# Patient Record
Sex: Female | Born: 1955 | Race: White | Hispanic: No | Marital: Married | Smoking: Current every day smoker
Health system: Southern US, Community
[De-identification: ages and names within clinical notes are randomized; demographics above are authoritative.]

## PROBLEM LIST (undated history)

## (undated) DIAGNOSIS — J449 Chronic obstructive pulmonary disease, unspecified: Secondary | ICD-10-CM

## (undated) DIAGNOSIS — M1990 Unspecified osteoarthritis, unspecified site: Secondary | ICD-10-CM

## (undated) DIAGNOSIS — I639 Cerebral infarction, unspecified: Secondary | ICD-10-CM

## (undated) DIAGNOSIS — I771 Stricture of artery: Secondary | ICD-10-CM

## (undated) DIAGNOSIS — R519 Headache, unspecified: Secondary | ICD-10-CM

## (undated) DIAGNOSIS — R51 Headache: Secondary | ICD-10-CM

## (undated) DIAGNOSIS — C801 Malignant (primary) neoplasm, unspecified: Secondary | ICD-10-CM

## (undated) DIAGNOSIS — K5792 Diverticulitis of intestine, part unspecified, without perforation or abscess without bleeding: Secondary | ICD-10-CM

## (undated) DIAGNOSIS — T8859XA Other complications of anesthesia, initial encounter: Secondary | ICD-10-CM

## (undated) DIAGNOSIS — T4145XA Adverse effect of unspecified anesthetic, initial encounter: Secondary | ICD-10-CM

## (undated) DIAGNOSIS — G935 Compression of brain: Secondary | ICD-10-CM

## (undated) DIAGNOSIS — J189 Pneumonia, unspecified organism: Secondary | ICD-10-CM

## (undated) DIAGNOSIS — K219 Gastro-esophageal reflux disease without esophagitis: Secondary | ICD-10-CM

## (undated) DIAGNOSIS — H269 Unspecified cataract: Secondary | ICD-10-CM

## (undated) HISTORY — PX: BREAST SURGERY: SHX581

## (undated) HISTORY — PX: OTHER SURGICAL HISTORY: SHX169

## (undated) HISTORY — PX: EYE SURGERY: SHX253

## (undated) HISTORY — PX: RHINOPLASTY: SUR1284

## (undated) HISTORY — PX: INNER EAR SURGERY: SHX679

## (undated) HISTORY — PX: TONSILLECTOMY: SUR1361

## (undated) HISTORY — PX: ABDOMINAL HYSTERECTOMY: SHX81

## (undated) HISTORY — PX: PARTIAL COLECTOMY: SHX5273

## (undated) HISTORY — PX: TUBAL LIGATION: SHX77

## (undated) HISTORY — PX: COLON SURGERY: SHX602

## (undated) HISTORY — PX: NEPHRECTOMY: SHX65

---

## 2002-11-14 ENCOUNTER — Other Ambulatory Visit: Admission: RE | Admit: 2002-11-14 | Discharge: 2002-11-14 | Payer: Self-pay | Admitting: Obstetrics & Gynecology

## 2003-04-04 ENCOUNTER — Encounter: Admission: RE | Admit: 2003-04-04 | Discharge: 2003-04-04 | Payer: Self-pay | Admitting: Obstetrics and Gynecology

## 2003-04-08 ENCOUNTER — Ambulatory Visit (HOSPITAL_COMMUNITY): Admission: RE | Admit: 2003-04-08 | Discharge: 2003-04-08 | Payer: Self-pay | Admitting: Obstetrics and Gynecology

## 2003-12-03 ENCOUNTER — Ambulatory Visit (HOSPITAL_COMMUNITY): Admission: RE | Admit: 2003-12-03 | Discharge: 2003-12-03 | Payer: Self-pay | Admitting: Family Medicine

## 2003-12-17 ENCOUNTER — Encounter (INDEPENDENT_AMBULATORY_CARE_PROVIDER_SITE_OTHER): Payer: Self-pay | Admitting: Specialist

## 2003-12-17 ENCOUNTER — Encounter: Admission: RE | Admit: 2003-12-17 | Discharge: 2003-12-17 | Payer: Self-pay | Admitting: Family Medicine

## 2004-08-22 ENCOUNTER — Ambulatory Visit (HOSPITAL_COMMUNITY): Admission: RE | Admit: 2004-08-22 | Discharge: 2004-08-22 | Payer: Self-pay | Admitting: General Practice

## 2004-08-25 ENCOUNTER — Ambulatory Visit: Payer: Self-pay | Admitting: Internal Medicine

## 2004-09-09 ENCOUNTER — Emergency Department (HOSPITAL_COMMUNITY): Admission: EM | Admit: 2004-09-09 | Discharge: 2004-09-09 | Payer: Self-pay | Admitting: Emergency Medicine

## 2005-12-25 ENCOUNTER — Emergency Department (HOSPITAL_COMMUNITY): Admission: EM | Admit: 2005-12-25 | Discharge: 2005-12-25 | Payer: Self-pay | Admitting: Emergency Medicine

## 2006-12-02 ENCOUNTER — Emergency Department (HOSPITAL_COMMUNITY): Admission: EM | Admit: 2006-12-02 | Discharge: 2006-12-02 | Payer: Self-pay | Admitting: Emergency Medicine

## 2007-11-04 ENCOUNTER — Emergency Department (HOSPITAL_COMMUNITY): Admission: EM | Admit: 2007-11-04 | Discharge: 2007-11-04 | Payer: Self-pay | Admitting: Emergency Medicine

## 2008-03-17 ENCOUNTER — Inpatient Hospital Stay (HOSPITAL_COMMUNITY): Admission: EM | Admit: 2008-03-17 | Discharge: 2008-03-20 | Payer: Self-pay | Admitting: Emergency Medicine

## 2008-03-23 ENCOUNTER — Emergency Department (HOSPITAL_COMMUNITY): Admission: EM | Admit: 2008-03-23 | Discharge: 2008-03-23 | Payer: Self-pay | Admitting: Emergency Medicine

## 2008-03-29 ENCOUNTER — Encounter: Payer: Self-pay | Admitting: Interventional Radiology

## 2008-04-25 ENCOUNTER — Inpatient Hospital Stay (HOSPITAL_COMMUNITY): Admission: RE | Admit: 2008-04-25 | Discharge: 2008-04-26 | Payer: Self-pay | Admitting: Interventional Radiology

## 2008-05-09 ENCOUNTER — Encounter: Payer: Self-pay | Admitting: Interventional Radiology

## 2008-05-29 ENCOUNTER — Inpatient Hospital Stay (HOSPITAL_COMMUNITY): Admission: EM | Admit: 2008-05-29 | Discharge: 2008-06-01 | Payer: Self-pay | Admitting: Emergency Medicine

## 2008-06-05 ENCOUNTER — Emergency Department (HOSPITAL_COMMUNITY): Admission: EM | Admit: 2008-06-05 | Discharge: 2008-06-05 | Payer: Self-pay | Admitting: Emergency Medicine

## 2009-01-17 ENCOUNTER — Encounter: Admission: RE | Admit: 2009-01-17 | Discharge: 2009-01-17 | Payer: Self-pay | Admitting: Internal Medicine

## 2009-01-21 ENCOUNTER — Encounter: Payer: Self-pay | Admitting: Cardiology

## 2009-01-23 DIAGNOSIS — F329 Major depressive disorder, single episode, unspecified: Secondary | ICD-10-CM

## 2009-01-23 DIAGNOSIS — F17200 Nicotine dependence, unspecified, uncomplicated: Secondary | ICD-10-CM

## 2009-02-04 ENCOUNTER — Ambulatory Visit: Payer: Self-pay | Admitting: Cardiology

## 2009-02-04 DIAGNOSIS — R079 Chest pain, unspecified: Secondary | ICD-10-CM | POA: Insufficient documentation

## 2009-02-10 ENCOUNTER — Telehealth (INDEPENDENT_AMBULATORY_CARE_PROVIDER_SITE_OTHER): Payer: Self-pay | Admitting: *Deleted

## 2009-02-11 ENCOUNTER — Ambulatory Visit: Payer: Self-pay | Admitting: Cardiology

## 2009-02-11 ENCOUNTER — Ambulatory Visit: Payer: Self-pay

## 2009-02-11 ENCOUNTER — Encounter (HOSPITAL_COMMUNITY): Admission: RE | Admit: 2009-02-11 | Discharge: 2009-04-09 | Payer: Self-pay | Admitting: Cardiology

## 2009-02-18 LAB — CONVERTED CEMR LAB
Cholesterol: 272 mg/dL — ABNORMAL HIGH (ref 0–200)
Direct LDL: 189 mg/dL
Total CHOL/HDL Ratio: 7
VLDL: 46 mg/dL — ABNORMAL HIGH (ref 0.0–40.0)

## 2009-03-06 ENCOUNTER — Encounter: Payer: Self-pay | Admitting: Cardiology

## 2009-03-10 ENCOUNTER — Ambulatory Visit: Payer: Self-pay | Admitting: Cardiology

## 2009-03-10 DIAGNOSIS — G458 Other transient cerebral ischemic attacks and related syndromes: Secondary | ICD-10-CM

## 2009-03-10 DIAGNOSIS — E785 Hyperlipidemia, unspecified: Secondary | ICD-10-CM

## 2009-04-01 ENCOUNTER — Emergency Department (HOSPITAL_COMMUNITY): Admission: EM | Admit: 2009-04-01 | Discharge: 2009-04-01 | Payer: Self-pay | Admitting: Emergency Medicine

## 2009-04-29 ENCOUNTER — Ambulatory Visit: Payer: Self-pay | Admitting: Internal Medicine

## 2009-04-29 DIAGNOSIS — J4489 Other specified chronic obstructive pulmonary disease: Secondary | ICD-10-CM | POA: Insufficient documentation

## 2009-04-29 DIAGNOSIS — J449 Chronic obstructive pulmonary disease, unspecified: Secondary | ICD-10-CM

## 2009-04-29 DIAGNOSIS — J8410 Pulmonary fibrosis, unspecified: Secondary | ICD-10-CM

## 2009-04-30 ENCOUNTER — Telehealth: Payer: Self-pay | Admitting: Internal Medicine

## 2009-05-05 ENCOUNTER — Telehealth (INDEPENDENT_AMBULATORY_CARE_PROVIDER_SITE_OTHER): Payer: Self-pay | Admitting: *Deleted

## 2009-05-05 ENCOUNTER — Ambulatory Visit: Payer: Self-pay | Admitting: Cardiology

## 2009-05-05 ENCOUNTER — Ambulatory Visit: Payer: Self-pay

## 2009-05-05 ENCOUNTER — Encounter: Payer: Self-pay | Admitting: Cardiology

## 2009-05-07 ENCOUNTER — Encounter: Admission: RE | Admit: 2009-05-07 | Discharge: 2009-05-07 | Payer: Self-pay | Admitting: Otolaryngology

## 2009-05-13 LAB — CONVERTED CEMR LAB
ALT: 16 units/L (ref 0–35)
AST: 20 units/L (ref 0–37)
Albumin: 3.6 g/dL (ref 3.5–5.2)
Cholesterol: 216 mg/dL — ABNORMAL HIGH (ref 0–200)
Direct LDL: 143.2 mg/dL
HDL: 55.3 mg/dL (ref >39.00)
Total Bilirubin: 0.2 mg/dL — ABNORMAL LOW (ref 0.3–1.2)
Total Protein: 6.7 g/dL (ref 6.0–8.3)
Triglycerides: 117 mg/dL (ref 0.0–149.0)

## 2009-05-27 ENCOUNTER — Telehealth: Payer: Self-pay | Admitting: Cardiology

## 2009-06-03 ENCOUNTER — Telehealth: Payer: Self-pay | Admitting: Cardiology

## 2009-06-04 ENCOUNTER — Ambulatory Visit: Payer: Self-pay | Admitting: Internal Medicine

## 2009-06-25 ENCOUNTER — Telehealth: Payer: Self-pay | Admitting: Cardiology

## 2009-07-08 ENCOUNTER — Telehealth: Payer: Self-pay | Admitting: Cardiology

## 2010-01-25 ENCOUNTER — Encounter: Payer: Self-pay | Admitting: Internal Medicine
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
## 2010-02-03 NOTE — Progress Notes (Signed)
Summary: Pt need samples of LIpitor  Phone Note Call from Patient Call back at Home Phone [phone]   Caller: Loma Sousa Summary of Call: Pt need samples on Lipitor Initial call taken by: Judie Grieve,  June 25, 2009 10:03 AM  Follow-up for Phone Call        Samples left at front desk.  Left lipitor 40mg  pt is to take 2 by mouth daily for total of 80mg . Pt husband notified. [name], CNA  June 25, 2009 10:19 AM  Follow-up by: [name], CNA,  June 25, 2009 10:20 AM

## 2010-02-03 NOTE — Assessment & Plan Note (Signed)
Summary: ekg  Nurse Visit   Allergies: 1)  ! Penicillin 2)  ! * Contrast Dye 05/05/09--10am--pt here for EKG for pre op clearance, and lab work--pt states having ear surgery by dr Ermalinda Barrios in [place] --per repeat call to pt, it is learned that her PCP, dr Maryella Shivers, is going to give clearance for surgery and is having all pertinent information sent to her office for pre op evaluation and clearance--lab work drawn today was ordered by dr hochrein at her last o.v. and has nothing to do with surgery--after learning who was doing surgery, icalled dr Donaciano Eva office and learned that surgery had not been sched yet, as they are waiting for clearance from dr Chari Manning fax EKG to dr Lovell Sheehan office and to dr Dorma Russell --nt

## 2010-02-03 NOTE — Progress Notes (Signed)
Summary: EKG order placed  Phone Note From Other Clinic Call back at  ex [phone]   Caller: [facility], Jamei Call For: wert Summary of Call: pt had ekg at [address] this am need order pt in Initial call taken by: Rickard Patience,  May 05, 2009 9:17 AM  Follow-up for Phone Call        spoke with nurse, Asher Muir.  Asher Muir states pt is being seen today over at [facility] and told nurse that MW ordered pt to have an EKG today because of a pending ear surgery.  No order in EMR.  Will forward message to MW to see if he had wanted this for pt.  Arman Filter LPN  May 05, 452 9:20 AM  done Follow-up by: Nyoka Cowden MD,  May 05, 2009 1:09 PM

## 2010-02-03 NOTE — Miscellaneous (Signed)
Summary: Orders Update pft charges  Clinical Lists Changes  Orders: Added new Service order of Lung Volumes (94240) - Signed Added new Service order of Carbon Monoxide diffusing w/capacity (94720) - Signed Added new Service order of Spirometry (Pre & Post) (94060) - Signed 
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
## 2010-02-03 NOTE — Progress Notes (Signed)
Summary: Samples of lipitor  Phone Note Call from Patient Call back at Home Phone [phone]   Caller: Patient Summary of Call: Need samples of Lipitor Initial call taken by: Judie Grieve,  Jun 03, 2009 11:07 AM  Follow-up for Phone Call        Pt notified, samples left at front desk. [name], CNA  Jun 03, 2009 11:56 AM  Follow-up by: [name], CNA,  Jun 03, 2009 11:56 AM
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
## 2010-02-03 NOTE — Progress Notes (Signed)
Summary: samples  Phone Note Call from Patient Call back at Home Phone [phone]   Caller: Patient Reason for Call: Talk to Nurse Summary of Call: request samples of lipitor 80mg  Initial call taken by: Migdalia Dk,  May 27, 2009 1:24 PM  Follow-up for Phone Call        I spoke with Ms. Dodgen and she is out of Lipitor as of last pm 05-26-09.  I told her we would possibly have samples in on Friday.  She said she would call back Friday.  She is on Lipitor 80mg . Lisabeth Devoid RN  I spoke with Arline Asp and the drug rep will try and bring samples into the office on Friday. Julieta Gutting, RN, BSN  May 27, 2009 2:54 PM

## 2010-02-03 NOTE — Progress Notes (Signed)
Summary: Nuclear pre procedure  Phone Note Outgoing Call Call back at [facility] Phone [phone]   Call placed by: Rea College, CMA,  February 10, 2009 4:03 PM Call placed to: Patient Summary of Call: Left message with information on Myoview Information Sheet (see scanned document for details).      Nuclear Med Background Indications for Stress Test: Evaluation for Ischemia   History: COPD  History Comments: 4/10 PTCA/Stent-(L)subclavian artery; h/o (R) severe vertebral artery stenosis  Symptoms: Chest Pain, SOB  Symptoms Comments: CP>(L)shoulder and neck   Nuclear Pre-Procedure Cardiac Risk Factors: PVD, Smoker Height (in): 62   Appended Document: Nuclear pre procedure PT AWARE OF RESULTS AND TO F/U AS SCHEDULED

## 2010-02-03 NOTE — Miscellaneous (Signed)
  Clinical Lists Changes  Observations: Added new observation of NUCLEAR NOS: Exercise Capacity: Poor exercise capacity. BP Response: Normal blood pressure response. Clinical Symptoms: SOB ECG Impression: No significant ST segment change suggestive of ischemia. Overall Impression: Normal stress nuclear study. Poor exercise tolerance    (02/11/2009 15:34)      Nuclear Study  Procedure date:  02/11/2009  Findings:      Exercise Capacity: Poor exercise capacity. BP Response: Normal blood pressure response. Clinical Symptoms: SOB ECG Impression: No significant ST segment change suggestive of ischemia. Overall Impression: Normal stress nuclear study. Poor exercise tolerance

## 2010-02-03 NOTE — Assessment & Plan Note (Signed)
Summary: Cardiology Nuclear Study  Nuclear Med Background Indications for Stress Test: Evaluation for Ischemia   History: COPD  History Comments: 4/10 PTCA/Stent-(L)subclavian artery; h/o (R) severe vertebral artery stenosis  Symptoms: Chest Pain, SOB  Symptoms Comments: CP>(L)shoulder and neck   Nuclear Pre-Procedure Cardiac Risk Factors: PVD, Smoker Caffeine/Decaff Intake: None NPO After: 6:00 PM Lungs: clear IV 0.9% NS with Angio Cath: 22g     IV Site: (R) AC IV Started by: Irean Hong RN Chest Size (in) 34     Cup Size B     Height (in): 62 Weight (lb): 119 BMI: 21.84  Nuclear Med Study 1 or 2 day study:  1 day     Stress Test Type:  Stress Reading MD:  Willa Rough, MD     Referring MD:  J.Hochrein Resting Radionuclide:  Technetium 80m Tetrofosmin     Resting Radionuclide Dose:  10.5 mCi  Stress Radionuclide:  Technetium 70m Tetrofosmin     Stress Radionuclide Dose:  32.0 mCi   Stress Protocol Exercise Time (min):  4:00 min     Max HR:  150 bpm     Predicted Max HR:  166 bpm  Max Systolic BP: 136 mm Hg     Percent Max HR:  90.36 %     METS: 5.70 Rate Pressure Product:  16109    Stress Test Technologist:  Milana Na EMT-P     Nuclear Technologist:  Burna Mortimer Deal RT-N  Rest Procedure  Myocardial perfusion imaging was performed at rest 45 minutes following the intravenous administration of Myoview Technetium 25m Tetrofosmin.  Stress Procedure  The patient exercised for 4:00. The patient stopped due to fatigue, sob, and denied any chest pain.  There were no significant ST-T wave changes.  Myoview was injected at peak exercise and myocardial perfusion imaging was performed after a brief delay.  QPS Raw Data Images:  Patient motion noted; appropriate software correction applied. Stress Images:  There is normal uptake in all areas. Rest Images:  Normal homogeneous uptake in all areas of the myocardium. Subtraction (SDS):  No evidence of ischemia. Transient  Ischemic Dilatation:  1.16  (Normal <1.22)  Lung/Heart Ratio:  .26  (Normal <0.45)  Quantitative Gated Spect Images QGS EDV:  55 ml QGS ESV:  18 ml QGS EF:  67 % QGS cine images:  Normal motion  Findings Normal nuclear study      Overall Impression  Exercise Capacity: Poor exercise capacity. BP Response: Normal blood pressure response. Clinical Symptoms: SOB ECG Impression: No significant ST segment change suggestive of ischemia. Overall Impression: Normal stress nuclear study. Poor exercise tolerance

## 2010-02-03 NOTE — Progress Notes (Signed)
Summary: ENT = Dorma Russell  Phone Note Call from Patient Call back at [facility] Phone [phone]   Caller: SPOUSE-STEVE Call For: Wanda James Summary of Call: [facility] FOR WIFE IS DR ERIC Rayburn Ma Initial call taken by: Lacinda Axon,  April 30, 2009 2:43 PM  Follow-up for Phone Call        Pt's spouse Brett Canales states Dr. Ermalinda Barrios is the ENT who is supposed to "patch the hole in her eardrum."  States they were told by MW to call with this info.  will forward to MW as Lorain Childes.  Gweneth Dimitri RN  April 30, 2009 2:50 PM got it, thanks Follow-up by: Nyoka Cowden MD,  April 30, 2009 4:27 PM

## 2010-02-03 NOTE — Letter (Signed)
Summary: [facility] patient records  [facility] patient records   Imported By: Kassie Mends 03/13/2009 10:35:15  _____________________________________________________________________  External Attachment:    Type:   Image     Comment:   External Document
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
## 2010-02-03 NOTE — Progress Notes (Signed)
Summary: samples  Phone Note Call from Patient Call back at Home Phone [phone]   Caller: Wanda James Summary of Call: Pt need samples of Liptor Initial call taken by: Judie Grieve,  July 08, 2009 9:00 AM  Follow-up for Phone Call        Lipitor 40mg  2 by mouth daily #28 left at front desk for pt. A discount card was also left. LMOM for pt. [name], CNA  July 08, 2009 10:44 AM  Follow-up by: [name], CNA,  July 08, 2009 10:44 AM

## 2010-04-14 LAB — DIFFERENTIAL
Basophils Absolute: 0.1 10*3/uL (ref 0.0–0.1)
Basophils Relative: 1 % (ref 0–1)
Basophils Relative: 1 % (ref 0–1)
Eosinophils Absolute: 0.5 10*3/uL (ref 0.0–0.7)
Lymphocytes Relative: 45 % (ref 12–46)
Lymphs Abs: 1.5 10*3/uL (ref 0.7–4.0)
Monocytes Absolute: 0.5 10*3/uL (ref 0.1–1.0)
Monocytes Relative: 9 % (ref 3–12)
Neutro Abs: 1.4 10*3/uL — ABNORMAL LOW (ref 1.7–7.7)
Neutro Abs: 2.9 10*3/uL (ref 1.7–7.7)
Neutrophils Relative %: 33 % — ABNORMAL LOW (ref 43–77)
Neutrophils Relative %: 54 % (ref 43–77)

## 2010-04-14 LAB — RPR: RPR Ser Ql: NONREACTIVE

## 2010-04-14 LAB — BASIC METABOLIC PANEL
BUN: 22 mg/dL (ref 6–23)
Calcium: 9.4 mg/dL (ref 8.4–10.5)
Creatinine, Ser: 0.87 mg/dL (ref 0.4–1.2)
GFR calc Af Amer: >60 mL/min (ref >60)
GFR calc non Af Amer: >60 mL/min (ref >60)

## 2010-04-14 LAB — URINALYSIS, ROUTINE W REFLEX MICROSCOPIC
Glucose, UA: NEGATIVE mg/dL
Hgb urine dipstick: NEGATIVE
Ketones, ur: NEGATIVE mg/dL
Protein, ur: NEGATIVE mg/dL
Urobilinogen, UA: 0.2 mg/dL (ref 0.0–1.0)

## 2010-04-14 LAB — CBC
HCT: 36.5 % (ref 36.0–46.0)
Hemoglobin: 12.5 g/dL (ref 12.0–15.0)
MCV: 92.4 fL (ref 78.0–100.0)
Platelets: 150 10*3/uL (ref 150–400)
RBC: 4.49 MIL/uL (ref 3.87–5.11)
WBC: 4.3 10*3/uL (ref 4.0–10.5)
WBC: 5.4 10*3/uL (ref 4.0–10.5)

## 2010-04-14 LAB — LIPID PANEL
Cholesterol: 184 mg/dL (ref 0–200)
Total CHOL/HDL Ratio: 8 RATIO

## 2010-04-14 LAB — COMPREHENSIVE METABOLIC PANEL
Alkaline Phosphatase: 70 U/L (ref 39–117)
BUN: 15 mg/dL (ref 6–23)
CO2: 28 mEq/L (ref 19–32)
Chloride: 112 mEq/L (ref 96–112)
Creatinine, Ser: 0.78 mg/dL (ref 0.4–1.2)
GFR calc non Af Amer: >60 mL/min (ref >60)
Glucose, Bld: 75 mg/dL (ref 70–99)
Potassium: 4.2 mEq/L (ref 3.5–5.1)
Total Bilirubin: 0.2 mg/dL — ABNORMAL LOW (ref 0.3–1.2)

## 2010-04-14 LAB — PROTIME-INR
INR: 0.9 (ref 0.00–1.49)
Prothrombin Time: 12.5 seconds (ref 11.6–15.2)

## 2010-04-14 LAB — POCT CARDIAC MARKERS: Troponin i, poc: <0.05 ng/mL (ref 0.00–0.09)

## 2010-04-14 LAB — TSH
TSH: 0.925 u[IU]/mL (ref 0.350–4.500)
TSH: 2.541 u[IU]/mL (ref 0.350–4.500)

## 2010-04-14 LAB — APTT: aPTT: 34 seconds (ref 24–37)

## 2010-04-14 LAB — HOMOCYSTEINE: Homocysteine: 7 umol/L (ref 4.0–15.4)

## 2010-04-15 LAB — BASIC METABOLIC PANEL
BUN: 12 mg/dL (ref 6–23)
Calcium: 9.8 mg/dL (ref 8.4–10.5)
Chloride: 110 mEq/L (ref 96–112)
GFR calc Af Amer: >60 mL/min (ref >60)
GFR calc non Af Amer: >60 mL/min (ref >60)
GFR calc non Af Amer: >60 mL/min (ref >60)
Potassium: 3.4 mEq/L — ABNORMAL LOW (ref 3.5–5.1)
Potassium: 4.3 mEq/L (ref 3.5–5.1)
Sodium: 140 mEq/L (ref 135–145)
Sodium: 143 mEq/L (ref 135–145)

## 2010-04-15 LAB — PROTIME-INR
INR: 0.9 (ref 0.00–1.49)
INR: 1 (ref 0.00–1.49)
Prothrombin Time: 12.2 seconds (ref 11.6–15.2)

## 2010-04-15 LAB — CBC
HCT: 36 % (ref 36.0–46.0)
HCT: 45 % (ref 36.0–46.0)
MCV: 91 fL (ref 78.0–100.0)
MCV: 91.9 fL (ref 78.0–100.0)
Platelets: 200 10*3/uL (ref 150–400)
RBC: 3.95 MIL/uL (ref 3.87–5.11)
RBC: 4.23 MIL/uL (ref 3.87–5.11)
WBC: 6.6 10*3/uL (ref 4.0–10.5)
WBC: 7.8 10*3/uL (ref 4.0–10.5)
WBC: 9.9 10*3/uL (ref 4.0–10.5)

## 2010-04-15 LAB — APTT
aPTT: 33 seconds (ref 24–37)
aPTT: 63 seconds — ABNORMAL HIGH (ref 24–37)

## 2010-04-15 LAB — HEPARIN LEVEL (UNFRACTIONATED): Heparin Unfractionated: 0.29 IU/mL — ABNORMAL LOW (ref 0.30–0.70)

## 2010-04-15 LAB — DIFFERENTIAL
Eosinophils Relative: 10 % — ABNORMAL HIGH (ref 0–5)
Lymphocytes Relative: 31 % (ref 12–46)
Lymphs Abs: 2.1 10*3/uL (ref 0.7–4.0)
Neutro Abs: 3.3 10*3/uL (ref 1.7–7.7)

## 2010-04-16 LAB — DIFFERENTIAL
Basophils Absolute: 0.1 10*3/uL (ref 0.0–0.1)
Basophils Relative: 2 % — ABNORMAL HIGH (ref 0–1)
Eosinophils Absolute: 0.2 10*3/uL (ref 0.0–0.7)
Monocytes Relative: 7 % (ref 3–12)
Neutro Abs: 3.3 10*3/uL (ref 1.7–7.7)
Neutrophils Relative %: 58 % (ref 43–77)

## 2010-04-16 LAB — CBC
HCT: 41 % (ref 36.0–46.0)
HCT: 42.3 % (ref 36.0–46.0)
Hemoglobin: 14.1 g/dL (ref 12.0–15.0)
Hemoglobin: 14.4 g/dL (ref 12.0–15.0)
MCHC: 34 g/dL (ref 30.0–36.0)
Platelets: 257 10*3/uL (ref 150–400)
RBC: 4.68 MIL/uL (ref 3.87–5.11)
RDW: 16.4 % — ABNORMAL HIGH (ref 11.5–15.5)
WBC: 4.8 10*3/uL (ref 4.0–10.5)

## 2010-04-16 LAB — CULTURE, BLOOD (ROUTINE X 2): Culture: NO GROWTH

## 2010-04-16 LAB — URINALYSIS, ROUTINE W REFLEX MICROSCOPIC
Bilirubin Urine: NEGATIVE
Hgb urine dipstick: NEGATIVE
Ketones, ur: NEGATIVE mg/dL
Nitrite: NEGATIVE
Protein, ur: NEGATIVE mg/dL
Urobilinogen, UA: 0.2 mg/dL (ref 0.0–1.0)

## 2010-04-16 LAB — COMPREHENSIVE METABOLIC PANEL
ALT: 20 U/L (ref 0–35)
Alkaline Phosphatase: 90 U/L (ref 39–117)
BUN: 14 mg/dL (ref 6–23)
CO2: 26 mEq/L (ref 19–32)
GFR calc non Af Amer: >60 mL/min (ref >60)
Glucose, Bld: 89 mg/dL (ref 70–99)
Potassium: 3.5 mEq/L (ref 3.5–5.1)
Sodium: 141 mEq/L (ref 135–145)
Total Protein: 6.5 g/dL (ref 6.0–8.3)

## 2010-04-16 LAB — PROTIME-INR
INR: 1 (ref 0.00–1.49)
Prothrombin Time: 13.8 seconds (ref 11.6–15.2)

## 2010-04-16 LAB — APTT: aPTT: 35 seconds (ref 24–37)

## 2010-04-16 LAB — POCT CARDIAC MARKERS: Myoglobin, poc: 81.1 ng/mL (ref 12–200)

## 2010-05-19 NOTE — H&P (Signed)
NAME:  Wanda James, Wanda James                ACCOUNT NO.:  [id number]   MEDICAL RECORD NO.:  [id number]          PATIENT TYPE:  OIB   LOCATION:  3172                         FACILITY:  [facility]   PHYSICIAN:  Sanjeev K. Deveshwar, M.D.DATE OF BIRTH:  08/30/1955   DATE OF ADMISSION:  04/25/2008  DATE OF DISCHARGE:                              HISTORY & PHYSICAL   CHIEF COMPLAINT:  Subclavian artery stenosis.   HISTORY OF PRESENT ILLNESS:  This is a pleasant 55 year old female who  was admitted to [facility] on March 17, 2008 by Dr. Toniann Fail  for evaluation of headache and weakness.  An MRI of the brain was  performed on March 15 which showed no evidence of acute ischemia.  There  was a 6.3-mm anterior displacement of the cerebral tonsils consistent  with a Chiari type 1 malformation with foramen magnum crowding.  The  patient was noted to have a prominent subarachnoid FLAIR signal in the  perioccipital regions.  Differentials included pernicious fluid related  to infection versus subarachnoid hemorrhage versus intracranial  hypotension.   The patient was seen in consultation by Dr. Pearlean Brownie on March 18, 2008.  A  cerebral angiogram was recommended for further evaluation.  The cerebral  angiogram was performed on March 19, 2008 by Dr. Corliss Skains.  This showed  no evidence of intracranial aneurysms, dissections or occlusions.  The  patient did have a retrograde opacification of the distal half of the  basilar artery from the left posterior communicating artery.  She had a  severe focal stenosis of the nondominant right vertebral artery at its  origin.  There was 75% stenosis of the left subclavian artery with  decreased hemodynamic flow to the left vertebrobasilar junction and the  basilar artery.   After discharge from the hospital arrangements were made to have the  patient follow-up with Dr. Corliss Skains regarding her left subclavian  artery stenosis.  She was seen in consultation on March 29, 2008 and at  that time treatment options were discussed and a decision was made to  proceed with stent assisted angioplasty.  The patient is admitted to  [facility] on April 25, 2008 for the procedure.   PAST MEDICAL HISTORY:  Is significant for:  1. Chronic headaches.  2. History of depression.  3. History of renal cell carcinoma and is status post left      nephrectomy.  4. History of Chiari malformation.  5. History of ongoing tobacco use and probable COPD.   ALLERGIES:  The patient is allergic to PENICILLIN AND CONTRAST MEDIA.  She states that contrast dye caused her to go into a coma.   MEDICATIONS ON ADMISSION:  1. Aspirin daily.  2. Plavix 75 mg daily.  3. Depakote.  4. Prilosec.  5. Tylenol.  6. BC Powders.  7. Hydrocodone p.r.n.  8. The patient was treated with a 13-hour prednisone protocol for her      history of contrast allergy.   SURGICAL HISTORY:  The patient is status post left nephrectomy for  carcinoma.  She is status post hysterectomy.  She states that  she has  been slow to wake up after general anesthesia in the past.   SOCIAL HISTORY:  The patient is married and lives in [place] with her  husband.  She continues to smoke three-quarters pack cigarettes per day.  She does not use alcohol.  She works as a Designer, industrial/product for [employer].   FAMILY HISTORY:  Her mother died at age 73 the cause was unknown.  Her  father died at age 22 from prostate cancer.  She has five brothers and  four sisters all of whom are alive and well.   LABORATORY DATA:  The chemistry profile on April 19 revealed a BUN of  12, creatinine 0.74, GFR was greater than 60, potassium was 4.3, glucose  was 80.  A PTT was 33 and INR was 0.9 with a protime of 12.2.  The CBC  revealed hemoglobin 15.5, hematocrit 45, WBC 6.6 thousand, platelets  200,000.  A chest x-ray for April 19 showed no evidence of pulmonary  infiltrates.  The patient did have bilateral nodular densities  which  were felt to likely represent bilateral anterior rib fracture  deformities with pulmonary nodules considered less likely.  A chest CT  without contrast was recommended for further evaluation.   PHYSICAL EXAM:  GENERAL:  Revealed a 55 year old white female in no  acute distress.  VITAL SIGNS:  Blood pressure 101/72, pulse 80, respirations 18,  temperature 98, oxygen saturation 97%.  HEENT: Was unremarkable.  NECK:  Revealed no bruits.  HEART:  Revealed regular rate and rhythm without murmur.  LUNGS:  Clear.  ABDOMEN:  Soft, nontender.  EXTREMITIES:  Reveal pulses to be intact without edema.  Her airway was  rated at a 1, her ASA scale was a 3.  NEUROLOGY:  Revealed the patient to be alert and oriented.  Cranial  nerves II-XII grossly intact.  Sensation is intact to light touch.  Motor strength was 5/5 throughout.  Cerebellar testing was intact.   ADMISSION DIAGNOSES:  1. Left subclavian artery stenosis with planned stent assisted      angioplasty.  2. Right vertebral artery stenosis at its origin by angiogram March 19, 2008.  3. Chronic headaches.  4. Ongoing tobacco use with probable chronic obstructive pulmonary      disease.  5. Abnormal chest x-ray with follow-up CT scan recommended without      contrast.  6. History of depression.  7. History of renal cell carcinoma with a solitary kidney.  8. History of Chiari malformation.  9. Status post left nephrectomy for carcinoma.  10.Status post hysterectomy.  11.Contrast dye allergy prophylaxed with prednisone.  12.Penicillin allergy.  13.History of pneumonia in March 2010.      Delton See, P.A.    ______________________________  Grandville Silos. Corliss Skains, M.D.    DR/MEDQ  D:  04/25/2008  T:  04/25/2008  Job:  [id number]   cc:   [facility] [address]  Dr. Cleotilde Neer, MD

## 2010-05-19 NOTE — Consult Note (Signed)
NAME:  Wanda James, KUBIN NO.:  [id number]   MEDICAL RECORD NO.:  [id number]          PATIENT TYPE:  OUT   LOCATION:  XRAY                         FACILITY:  [facility]   PHYSICIAN:  Sanjeev K. 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   BRIEF HISTORY:  This is a pleasant 55 year old female who was admitted  to [facility] on March 17, 2008, by Dr. Toniann Fail for  evaluation of a headache and weakness.  An MRI of the brain was  performed on March 18, 2008, that showed a Chiari type 1 malformation  with a prominent subarachnoid FLAIR signal.  The patient was seen in  consultation by Dr. Pearlean Brownie on March 18, 2008.  A cerebral angiogram was  recommended.  The angiogram was performed by Dr. Corliss Skains on March 19, 2008.  This revealed a 75% stenosis of the left subclavian artery with  decreased hemodynamic flow to the left vertebrobasilar junction and  basilar artery.  The patient was also noted to have a severe focal  stenosis of the nondominant right vertebral artery at its origin.   The patient returned to see Dr. Corliss Skains in consultation on March 29, 2008, treatment options along with the possible risks and benefits were  discussed.  The decision was made to proceed with stent-assisted  angioplasty and the patient was admitted to [facility] on April 25, 2008, for that procedure.  The stent assisted angioplasty was  performed under conscious sedation with no immediate or known  complications.  The patient did note some left shoulder discomfort.  The  day after the procedure prior to discharge, it was felt that this was  possibly secondary to positioning  during the intervention.  She returns  today accompanied by her husband to be seen in followup.   PAST MEDICAL HISTORY:  Significant for chronic headaches.  She has a  history of depression, history of renal cell carcinoma.  She is status  post left nephrectomy.  She has a history of a Chiari malformation.  She  has ongoing tobacco use with probable COPD.  She had an abnormal chest x-  ray with questionable nodules in the past.  A CT scan was recommended.  She had an abnormal CT scan in 2000.  We performed a CT scan during her  most recent admission.  It was felt that the nodules corresponded with  old bilateral anterior rib fracture deformities.  There were no  suspicious pulmonary nodules or masses identified.  The patient did;  however, have further progression of chronic interstitial pneumonitis  since 2006, with right lower lobe interstitial fibrosis again noted.   ALLERGIES:  The patient is allergic to PENICILLIN and CONTRAST DYE.  She  reports that she had a severe allergic reaction to contrast dye in  the  past.  We did treat her with a 13-hour prednisone protocol, as well as  Benadryl prior to her intervention and she had no adverse reactions to  the contrast media.   CURRENT MEDICATIONS:  Aspirin 81 mg daily, Plavix 75 mg daily, Depakote,  Prilosec, Tylenol, and hydrocodone p.r.n.   SURGICAL HISTORY:  The patient is status post left nephrectomy for  carcinoma.  She is status post hysterectomy.  She states she has been  slow to wake up after general anesthesia in the past.  She has a  solitary kidney with no significant renal insufficiency.   SOCIAL HISTORY:  The patient is married.  She lives in [place] with  her husband.  She continues to smoke three-quarters pack of cigarettes  per day.  She does not use alcohol.  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   FAMILY HISTORY:  The patient's mother died at age 68.  The cause was  unknown.  Her father died at  age 60 from prostate cancer.  She has five  brothers and four sisters who are all alive and well.   IMPRESSION AND PLAN:  The patient returns today to be seen in followup  after undergoing stent-assisted angioplasty for symptomatic left  subclavian artery stenosis on April 25, 2008.  She is accompanied by her  husband.  She reports that she had previously had abnormal noises in her  ears, which she was possibly pulsatile tinnitus.  This has resolved  since the left subclavian artery was stented and she does report that  she still having some left shoulder and neck pain and occasionally has  sharp quick pains, which run through her left upper chest in the  vicinity of her left shoulder.  Dr. Corliss Skains was concerned that she  possibly had a rotator cuff injury or possibly a cervical spine problem.  He recommended that she see an orthopedic surgeon for further evaluation  of the symptoms.   The patient has a history of headaches.  She has continued to have  ongoing headaches since discharge from the hospital.  She had been  placed on Depakote for her headaches and obtained some relief; however,  the headaches appear to be returning.  Dr. Corliss Skains recommended that  she see a headache specialist for further evaluation.  He did not feel  that any of the above-noted symptoms were related to the stent placement  itself.   The patient also noted that she has been having a fair amount of  bruising.  Dr. Corliss Skains recommended that she change the Plavix to every  other day.  She should continue aspirin 81 mg along with the Plavix for  now.  An ultrasound will be performed in 3-4 months if the stent appears  to be patent at that time.  Dr. Corliss Skains will consider stopping the  Plavix.   The patient reports that she has been very fatigued since the  intervention and has been unable to get her endurance back.  She has  been trying to walk in the evenings with her husband for exercise.  However,  she still feels very deconditioned.  We asked her about the  possibility of depression as the patient does appear to be depressed,  although she strongly denied any depression.  She does have a previous  history of depression; however, we recommend that she continued to walk  for exercise as this will help to restore her endurance and should help  with  any depression if present.  Dr. Corliss Skains also strongly encouraged  her to try to quit smoking.   Dr. Corliss Skains reviewed the images from the actual procedure with the  patient and her husband.  He pointed out the before and after pictures  of the left subclavian artery stenosis.  There was an excellent result  following placement of the stent.  The patient has a residual right  vertebral artery stenosis; however, due to the increased blood flow  following the stenting, Dr. Corliss Skains did not feel that she would need  intervention for the right vertebral artery stenosis.  The patient has  continued to have some occasional dizziness.  The etiology of this is  uncertain as it appears that she has excellent blood flow to the back of  her brain.   Greater than 40 minutes was spent on this visit.  All of the patient's  and her husband's questions were answered.  Dr. Corliss Skains gave the  patient permission to return to work on Monday, the 10th.  She was told  she could resume her normal activities including driving.  We will  perform an ultrasound in 3-4 months for further evaluation of the left  subclavian artery stent.      Delton See, P.A.    ______________________________  Grandville Silos. Corliss Skains, M.D.    DR/MEDQ  D:  05/09/2008  T:  05/10/2008  Job:  [id number]   cc:   Pramod P. Pearlean Brownie, MD  Della Goo, M.D.  Eduard Clos, MD

## 2010-05-19 NOTE — Discharge Summary (Signed)
NAME:  OPEL, LEJEUNE NO.:  [id number]   MEDICAL RECORD NO.:  [id number]          PATIENT TYPE:  INP   LOCATION:  4737                         FACILITY:  [facility]   PHYSICIAN:  Eduard Clos, MDDATE OF BIRTH:  [birth year]   DATE OF ADMISSION:  03/17/2008  DATE OF DISCHARGE:  03/20/2008                               DISCHARGE SUMMARY   COURSE IN THE HOSPITAL:  A 55 year old female with known history of  Chiari malformation with chronic headache, history of depression on  medication and presently not depressed and no suicidal ideation  presented to the ER with a complaint of progressive weakness with cough  and phlegm.  On admission, the patient had a chest x-ray, which showed  features of pneumonia.  The patient was admitted for further workup and  evaluation of pneumonia.  The patient in addition also looked dehydrated  with nausea and vomiting.  In addition, the patient also has been having  chronic headache for 10 years, which had increased in intensity, which  she usually has at least 3 times a year.  CT head was done on admission,  which was negative.  Subsequent to which an MRI was done of the brain,  which showed abnormal signal in the cerebral sulci on FLASH sequences,  suggesting some acute blood vessel proteinaceous substance.  Neurology  consult was obtained.  As per neurologist, the patient underwent  cerebral angiogram per Dr. Corliss Skains, which did not show any aneurysm or  leak.  It did show left subclavian stenosis, for which the patient will  need further followup with Neurology as an outpatient.  Per Neurology,  the patient was started on prednisone dose pack and DHE protocol.  Subsequent to which the patient's symptoms greatly improved.  At this  time, the patient has significantly improved with consideration to her  headache and also clinically her pneumonia has improved.  Blood cultures  were negative at this time.  The patient will be  discharged home with  Depakote ER as suggested by neurologist along with antibiotics for  pneumonia and prednisone tapering dose.  The patient is advised to  follow up with the primary care physician within a week's time.  At the  time of this dictation, the patient is hemodynamically stable.   FINAL DIAGNOSES:  1. Pneumonia, community acquired.  2. Acute on chronic headaches.  3. Chiari type 1 malformation.  4. History of depression, presently not symptomatic, has no suicidal      ideation.   MEDICATIONS AT DISCHARGE:  1. Depakote ER 500 mg p.o. daily.  2. Tamiflu 75 mg p.o. b.i.d. for 3 more days.  3. Avelox 400 mg p.o. daily for 4 more days.  4. Prednisone tapering dose.   PLAN:  The patient is advised to follow up with primary care physician  within a week's time and follow up with Dr. Pearlean Brownie, neurologist, in 3  weeks' time for which she has to make appointment after calling.  The  patient is advised to avoid NSAIDs.  The patient is to be on a regular  diet.  Eduard Clos, MD  Electronically Signed     ANK/MEDQ  D:  03/20/2008  T:  03/21/2008  Job:  [id number]

## 2010-05-19 NOTE — H&P (Signed)
NAME:  Wanda, James NO.:  [id number]   MEDICAL RECORD NO.:  [id number]          PATIENT TYPE:  INP   LOCATION:  1832                         FACILITY:  [facility]   PHYSICIAN:  Lonia Blood, M.D.DATE OF BIRTH:  04/23/1955   DATE OF ADMISSION:  05/29/2008  DATE OF DISCHARGE:                              HISTORY & PHYSICAL   PRIMARY CARE PHYSICIAN:  Della Goo, M.D.   CHIEF COMPLAINT:  Slurred speech with altered gait.   PRESENT ILLNESS:  Ms. Wanda James is a very pleasant 55 year old  female who was diagnosed with L subclavian artery stenosis and steal  syndrome and underwent an angioplasty with stent placement on April 25, 2008 per Dr. Kerby Nora.  She was discharged in stable condition.  She reports that she awoke approximately 4-5 days ago and noted that she  was suffering with slurring of her speech.  She has also appreciated  significant weakness in her right arm and leg compared to her left.  Family endorses that her speech is certainly altered and reports that  the patient stumbles to the right severely with attempts to ambulate.  The patient in general has a feeling of just not being well and states  that she feels very tired.  She has her chronic headache but this is not  new to her.  She denies chest pain, fevers, chills, nausea, vomiting,  abdominal pain.  There have been no recent changes in her medications.  There has been no nausea or vomiting.  There has been no hematemesis or  hematochezia.   REVIEW OF SYSTEMS:  A comprehensive review of systems is unremarkable  with the exception to the positive elements noted in history of present  illness above.   PAST MEDICAL HISTORY:  1. 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  2. Known basilar artery stenosis.  3. Depression.  4. Renal cell carcinoma status post left nephrectomy.  5. Chiari 1  malformation.  6. Tobacco abuse in the amount of 3/4 pack per day for multiple years.  7. COPD.  8. Status post hysterectomy.  9. Chronic headaches on Depakote therapy.   OUTPATIENT MEDICATIONS:  Dosages unclear.  1. Prilosec.  2. Albuterol p.r.n.  3. Depakote ER 500 mg daily.  4. Plavix 75 mg daily.   ALLERGIES:  1. PENICILLIN.  2. IV DYE - the patient required premedication for her angiogram.   FAMILY HISTORY:  The patient's recent family history is reviewed and is  noncontributory this admission.   SOCIAL HISTORY:  As the patient is married.  She lives with her husband  in [place].  She does not drink alcohol.  She does smoke as noted  above.  She works as a Designer, industrial/product at [employer].   DATA REVIEW:  Sodium, potassium, chloride, bicarb are normal.  BUN is  elevated at 22 with creatinine normal at 0.87.  Serum glucose is  borderline at 111.  Calcium is normal.  CBC is unremarkable.  INR is  0.9, PTT is 34.  Urinalysis is negative.  Depakote level is 33.5.  Point-  of-care cardiac markers are negative x1.  Chest x-ray reveals no acute  disease.  A 12-lead EKG is normal sinus rhythm at 84 beats per minute  with no acute ST or T-wave changes.  CT scan of head reveals no acute  disease but reconfirms a Chiari 1 malformation.   PHYSICAL EXAMINATION:  VITAL SIGNS:  Temperature 97.5, blood pressure  117/75, heart rate 83, respiratory rate 15, O2 saturations 95% on room  air.  GENERAL:  A well-developed, well-nourished female in no acute  respiratory distress.  HEENT: Normocephalic, atraumatic.  Pupils equal,  round react to light and accommodation.  Extraocular muscles intact  bilaterally.  OC/OP clear.  NECK:  No JVD.  LUNGS:  Clear to auscultation bilaterally without wheezes or rhonchi.  CARDIOVASCULAR:  Regular rate and rhythm without murmur, gallop or rub.  Normal S1 and S2.  ABDOMEN:  Nontender, nondistended.  Soft, bowel sounds present.  No  organomegaly, no  rebound, no ascites.  EXTREMITIES:  No significant cyanosis, clubbing, edema bilateral  extremities.  NEUROLOGIC:  The patient does have a very subtle lisp-like slurring of  her speech.  Family endorses that this is new.  The patient has a very  subtle loss of the nasolabial fold on the right.  She has definite  decrease strength on the right compared to the left but does maintain  4+/5 strength on the right compared to 5/5 strength in the left.  There  is no Babinski.  There is no paralysis.   IMPRESSION AND PLAN:  1. Left brain subacute neurologic deficits status post left subclavian      stent April 25, 2008 - these findings are concerning.  The patient      does have a computerized tomography scan that reveals no evidence      of an acute cerebrovascular accident or hemorrhage.  Given the      onset of symptoms 4 to 5 days ago, certainly we would expect to      find evidence of stroke on computerized tomography if there had      been one.  The possibility of transient ischemic attack or      transient ischemic attack-like syndrome is #1 on my differential.      We will maintain the patient n.p.o.  We will continue her Plavix      and aspirin therapy.  I have consulted Neurology as well as      Interventional Neuroradiology to evaluate the patient.  They have      been informed of her location in the emergency room.  We will check      blood pressures in bilateral arms given the patient's history of      left subclavian still syndrome to help determine if there is a      worsening of blood flow.  MRI and MRA will be accomplished without      contrast on an emergent basis to help evaluate flow through the      patient's stent, as well as to r/o acute CVA.  This has not yet      been ordered by the emergency room staff.  2. Dehydration - the patient does appear clinically to be volume      depleted.  I suspect this is due to decreased intake as the patient      has not been feeling  well.  We will volume resuscitate the patient      using crystalloid isotonic saline.  We will follow her input and      output and her blood pressure very closely.  We wish to assure that      she has a normal perfusion pressure especially in the current      setting.  3. Chiari 1 malformation - I have discussed this finding with the      patient.  She reports that she has not previously been advised of      this diagnosis.  This certainly is a possible link to the patient's      chronic headaches.  Clearly at the present time she is not a      candidate and to consider further evaluation.  I have advised her      that this will need to be followed up in the outpatient setting      once the remainder of her issues become stabilized.  4. Tobacco abuse - I have counseled the patient as to the absolute      need for discontinuation of tobacco abuse.  Once the patient is      stabilized, we will consider formal tobacco cessation consultation.  5. Chronic obstructive pulmonary disease - presently the patient's      chronic obstructive pulmonary disease is well compensated.  We will      continue to follow her closely and intervene as indicated should      she develop respiratory distress.   DISPOSITION:  The patient will be placed in a step-down bed.  This is  simply due to the concern for possible acute neurologic decompensation.  Neurology and Interventional Neuroradiology have both been consulted as  noted above.      Lonia Blood, M.D.  Electronically Signed     JTM/MEDQ  D:  05/29/2008  T:  05/29/2008  Job:  [id number]   cc:   Della Goo, M.D.

## 2010-05-19 NOTE — Consult Note (Signed)
Wanda James, James NO.:  [id number]   MEDICAL RECORD NO.:  [id number]          PATIENT TYPE:  INP   LOCATION:  2926                         FACILITY:  [facility]   PHYSICIAN:  Wanda James, M.D.DATE OF BIRTH:  Feb 16, 1955   DATE OF CONSULTATION:  05/29/2008  DATE OF DISCHARGE:                                 CONSULTATION   REQUESTING PHYSICIAN:  Wanda James, M.D.   REASON FOR EVALUATION:  Possible stroke.   HISTORY OF PRESENT ILLNESS:  This is an inpatient consultation  evaluation of this existing Wanda James patient, a 55-  year-old woman evaluated by Dr. Pearlean James in March.  At that time, she was  admitted for a chronic headache, for which she was placed on Depakote.  However, she also had some findings on imaging which raised the question  of possible aneurysmal rupture, and angiography was recommended.  She  underwent cerebral angiogram on March 19, 2008, which demonstrated 75%  stenosis of the left subclavian artery with subclavian steal phenomenon  noted.  She underwent a stenting procedure for this on April 25, 2008,  which was uneventful.  Her cerebral angiogram was otherwise basically  unremarkable, except for some stenosis of the nondominant right  vertebral artery at its origin.  Her headaches persist, and she says it  went a little bit worse and she started Plavix after her stent.  Then  for the last 3 days, she says that she has had problems with difficulty  finding words and with a little bit of weakness in the right side.  She  says that when she walks, she tends to go off to the right as if she is  not picking up her right leg.  The symptoms have gotten a little bit  worse over the last few days.  [name], and is admitted for  further workup of this.   PAST MEDICAL HISTORY:  Remarkable for the headaches and angiogram as  above.  Etiology of headaches is unknown, but she does have an  associated Chiari I  malformation.  She has history of depression.  She  had renal cell carcinomas with left nephrectomy in the past.  She is an  active smoker.   FAMILY HISTORY/SOCIAL HISTORY/REVIEW OF SYSTEMS:  Per admission H&P of  Dr. Sharon James which is reviewed.   MEDICATIONS:  On admission, she was taking Depakote ER 500 mg daily,  aspirin, Plavix, Prilosec, and albuterol.   PHYSICAL EXAMINATION:  VITAL SIGNS:  Temperature 97.5, James pressure  117/75, pulse 83, respirations 15.  GENERAL:  [name], supine in the hospital bed,  in no evident distress.  HEAD:  Cranium is normocephalic and atraumatic.  Oropharynx benign.  NECK:  Supple without carotid or supraclavicular bruits.  HEART:  Regular rate and rhythm without murmurs.  NEUROLOGIC:  Mental status, she is awake and alert.  She is fully  oriented to time, place, and person.  Recent memory and remote memory  are intact.  Attention span, concentration, and fund of knowledge are  all appropriate.  She  seems to have very minimal word-finding  difficulty, but this is minimal at best.  She is able to name objects,  repeat phrases, and follow complex commands.  Cranial nerves, pupils are  equal and briskly reactive.  Extraocular movements are full without  nystagmus.  Visual fields are full to confrontation.  Hearing is intact  to conversational speech.  Facial sensation is diminished on the right  compared to the left.  Face, tongue, and palate move normally and  symmetrically.  Motor, normal bulk and tone.  She has minimal weakness  in the intrinsic muscles in the right hand, otherwise normal strength  throughout.  Sensation, diminished pinprick sensation of the right upper  extremity, otherwise intact to light touch and double stimulation  throughout.  Coordination, finger-nose and heel-to-shin are performed  accurately.  Rapid movements are performed accurately.  Gait is  deferred.  Reflexes 2+ and symmetric.  Toes downgoing  bilaterally.   LABORATORY REVIEW:  CBC and coags are unremarkable.  Depakote level is  34.  CT of the head is personally reviewed, and I would agree the study  is unremarkable, except for the Chiari I malformation.   IMPRESSION:  1. Very mild right upper extremity weakness and sensory changes,      question related to small left brain stroke versus possibly      migraine related.  2. Status post left subclavian stent about 5 weeks ago, doubt relation      to the above.  3. Chronic headache, probably chronic migraine in character.   PLAN:  We will check MRI of the brain with MRA of the intracranial  circulation.  For now, continue aspirin, Plavix, and Depakote.  Depending the outcome of the studies, she may need further stroke workup  or may consider increasing her Depakote or adding another migraine  prophylactic.  Stroke Service to follow up on test.      Wanda James, M.D.  Electronically Signed     MLR/MEDQ  D:  05/29/2008  T:  05/30/2008  Job:  [id number]   cc:   Wanda James, M.D.

## 2010-05-19 NOTE — Consult Note (Signed)
NAME:  Wanda James, DONNA NO.:  [id number]   MEDICAL RECORD NO.:  [id number]           PATIENT TYPE:   LOCATION:                                 FACILITY:   PHYSICIAN:  Pramod P. Pearlean Brownie, MD    DATE OF BIRTH:  1955-06-02   DATE OF CONSULTATION:  DATE OF DISCHARGE:                                 CONSULTATION   REFERRING PHYSICIAN:  Marcellus Scott, MD   REASON FOR REFERRAL:  Headaches and abnormal MRI scan.   HISTORY OF PRESENT ILLNESS:  Ms. Khun is a 55 year old Caucasian lady  who states she has a longstanding history of 10 years of chronic daily  headaches.  She describes she does have migraine headaches over the last  10 years.  She has had mild 4-5/10 in severity.  Headache bilaterally  along the vertex and occipital regions.  She has had 3 severe headache  exacerbations in the last one year, which she calls migraine.  The  present one began 3 days ago and she describes severe headache 10/10 in  severity accompanied by nausea, light sensitivity, sound sensitivity,  and dizziness.  Headache is still persistent.  She has been taking up to  4-5 tablets of Goody Powders and over-the-counter analgesics on a daily  basis.  She states she has never been evaluated by a neurologist or been  to a headache or Pain Center for headaches.  She has never been on  migraine prophylaxis, tried migraine specific strong drugs.  She denies  any family history of migraines.  She denies any recent fever, cough, or  chest pain.   PAST MEDICAL HISTORY:  Significant for chronic headaches, depression,  renal cell carcinoma, and left-sided nephrectomy.   HOME MEDICATIONS:  Percocet, APAP, Zithromax, Lovenox, Tamiflu,  ondansetron, senna, and Goody Powders.   MEDICATION ALLERGY:  None.   REVIEW OF SYSTEMS:  As documented above in history of present illness.   SOCIAL HISTORY:  The patient is married, lives with her husband.   PHYSICAL EXAMINATION:  GENERAL:  A middle-aged Caucasian  lady who  appears to be in distress due to headaches.  VITAL SIGNS:  She is afebrile with temperature of 97.2, pulse rate 73  per minute, regular, blood pressure 120/74, respiratory rate 20 per  minute, distal peripheral pulses are not felt.  HEAD:  Nontraumatic.  NECK:  Supple.  There is no bruit.  NEUROLOGIC:  She is pleasant, awake, alert, and cooperative.  There is  no aphasia, apraxia, or dysarthria.  Her eye movements are full range.  Face is symmetric.  Tongue is midline.  MOTOR SYSTEM:  No upper extremity drift; symmetric strength, tone,  reflexes coordination, and sensation.  There is no focal weakness.  Reflexes are symmetric.  Plantars are downgoing.  Tone is normal.   DATA REVIEWED:  MRI scan of the brain done yesterday was reviewed.  The  FLAIR sequences show diffuse hyperintensities and the cerebral sulci  which indicates a proteinaceous fluid versus subacute blood.  There is a  6.3 mm inferior tonsillar displacement suggestive of a type 1 Debroah Loop-  Chiari malformation.  There is a mild diffuse leptomeningeal enhancement  which is nonspecific.  Basic chemistries, CBC, cardiac enzymes are all  normal.   IMPRESSION:  A 55 year old lady with chronic daily headaches which  likely represent also migraine headaches which analgesically bound.  An  abnormal MRI scan on the brain suggestive of type 1 Arnold-Chiari I  malformation which may be incidental.  Abnormal signal in cerebral sulci  on FLAIR sequences suggest some acute blood vessels proteinaceous  substance.  Clinically same exam is not consistent with meningitis and  small leaking aneurysm with subarachnoid hemorrhages certainly a  possibility, which may be explored further.   PLAN:  I will recommend checking diagnostic cerebral catheter angiogram  as doing the spinal tap would be dangerous given her type 1 Arnold-  Chiari malformation.  IV hydration.  IV Depacon trial for headaches,  give 1 g now and then 500 daily, if  no response, then try DHE protocol  0.5 mg DHE plus 10 mg Reglan every 8 hourly for 48 hours.  I had a long  discussion with the patient and her husband regarding symptoms and MRI  findings and discussed plan for evaluation and treatment.  Kindly call  for questions.           ______________________________  Sunny Schlein. Pearlean Brownie, MD     PPS/MEDQ  D:  03/18/2008  T:  03/19/2008  Job:  [id number]

## 2010-05-19 NOTE — H&P (Signed)
NAME:  Wanda James, Wanda James NO.:  [id number]   MEDICAL RECORD NO.:  [id number]          PATIENT TYPE:  EMS   LOCATION:  MAJO                         FACILITY:  [facility]   PHYSICIAN:  Eduard Clos, MDDATE OF BIRTH:  [birth year]   DATE OF ADMISSION:  03/17/2008  DATE OF DISCHARGE:                              HISTORY & PHYSICAL   PRIMARY CARE PHYSICIAN:  Unassigned.   CHIEF COMPLAINT:  Weakness.   HISTORY OF PRESENT ILLNESS:  A 55 year old female with known history of  depression off medications last 4 days, chronic headache for past 10  years, Chiari malformation, history of renal cell carcinoma status post  left-sided nephrectomy presented to the ER complaining of increasing  weakness over the last 3 to 4 days.  The patient also has had some cough  with productive sputum and started getting progressively weaker.  Denies  any chest pain or shortness of breath.  Denies any loss of  consciousness.  The patient also has been having nausea and vomiting.  Denies any diarrhea, discharge, or dysuria.  The patient had a CT head  that did not show any acute findings, and the patient had a chest x-ray  that showed possibility of pneumonia.  Will be admitted for further  management.   PAST MEDICAL HISTORY:  History of chronic headache, depression, history  of renal cell carcinoma status post left nephrectomy.   PAST SURGICAL HISTORY:  Left nephrectomy for renal cell carcinoma.  Hysterectomy.   MEDICATIONS ON ADMISSION:  The patient was taking Paxil.  Stopped 4 days  ago.   ALLERGIES:  No known drug allergies.   FAMILY HISTORY:  Noncontributory.   SOCIAL HISTORY:  The patient smokes cigarettes.  Has been advised to  quit smoking.  Denies any alcohol or drug abuse.   REVIEW OF SYSTEMS:  As per the history of present illness.  Nothing else  significant.   PHYSICAL EXAMINATION:  CONSTITUTIONAL:  The patient examined at bedside.  Not in acute distress.  VITAL SIGNS:   Blood pressure is 92/76, pulse 80 per minute, temperature  97.8, respirations 18 per minute, O2 saturation 94% on room air.  HEENT:  Anicteric, no pallor.  No nuchal rigidity.  PERRLA.  Tongue is  midline.  No facial asymmetry.  CHEST:  Clear bilaterally to auscultation.  No rhonchi.  No crepitation.  HEART:  S1, S2 heard.  ABDOMEN:  Soft, nontender, bowel sounds heard.  CNS:  Oriented to time place, and person.  Moves upper and lower  extremities 5/5.   LABORATORY STUDIES:  Chest x-ray patchy bilateral airspace disease.  Followup x-rays recommended.  CT of the head without contrast shows no  acute intracranial process.  Likely Chiari 1 malformation.  CBC, WBCs  5.6, hemoglobin 14.4, hematocrit 42.3, platelets 245,000, neutrophils  58%.  PT/INR 13.8 and 1.  Comprehensive metabolic panel sodium 141,  potassium 3.5, chloride 106, carbon dioxide 26, glucose 89, BUN 14,  creatinine 0.7, total bilirubin 0.4, alkaline phosphatase 90, AST 21,  ALT 20, total protein 6.5, albumin 3.3, calcium 9, troponin I less than  0.05.  Urinalysis negative for nitrites.   ASSESSMENT:  1. Pneumonia.  2. Dehydration.  3. Nausea and vomiting, unknown etiology.  4. 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  5. History of renal cell carcinoma status post left-sided nephrectomy.  6. History of depression.  No suicidal ideation.   PLAN:  Will admit the patient to telemetry.  Will start the patient on  IV Avelox, blood cultures, sputum cultures.  Will also get MRA of the  brain for the headache.  Presently, the patient has no stigmata for any  meningitis.  Will follow cultures and further recommendation based on  results.      Eduard Clos, MD  Electronically Signed     ANK/MEDQ  D:  03/17/2008  T:  03/17/2008  Job:  [id number]

## 2010-05-19 NOTE — Procedures (Signed)
EEG NUMBER:  [id number]   CLINICAL HISTORY:  A 55 year old admitted with possible TIA, history of  left subclavian steal syndrome, with a stent placed April 25, 2008.  The  patient experienced slurred speech, 4-5 days ago with significant right-  sided weakness, history of chronic headaches.  The patient is sent on  Depakote.  She has chronic obstructive pulmonary disease. (784.5,342.01)   PROCEDURE:  The tracing is carried out on a 32-channel digital Cadwell  recorder reformatted into 16-channel montages with one devoted to EKG.  The patient was awake during the recording.  The International 10/20  system lead placement was used.   MEDICATIONS:  Morphine, Protonix, Plavix, aspirin, Zofran, Ventolin, and  Tylenol.   DESCRIPTION OF FINDINGS:  Dominant frequency is a 7 Hz, 30-50 microvolt  theta range activity.  Background is predominantly theta, some frontally  predominant beta range activity and rare central and posterior delta  range activity.  There was no focal slowing.  There was no interictal  epileptiform activity in the form of spikes or sharp waves.  There was  no change in state of arousal.  Photic stimulation induced a driving  response from 9-62 Hz.  Hyperventilation was not carried out.   IMPRESSION:  Abnormal EEG on the basis of mild diffuse background  slowing.  This is a nonspecific indicator of neuronal dysfunction that  maybe on a primary degenerative basis or maybe related to underlying  static encephalopathy or toxic metabolic encephalopathy.      Deanna Artis. Sharene Skeans, M.D.  Electronically Signed     XBM:WUXL  D:  05/30/2008 20:22:05  T:  05/31/2008 05:36:12  Job #:  [id number]   cc:   Monte Fantasia, MD

## 2010-05-19 NOTE — Discharge Summary (Signed)
James, Wanda NO.:  [id number]   MEDICAL RECORD NO.:  [id number]          PATIENT TYPE:  INP   LOCATION:  3030                         FACILITY:  [facility]   PHYSICIAN:  Monte Fantasia, MD  DATE OF BIRTH:  [birth year]   DATE OF ADMISSION:  05/29/2008  DATE OF DISCHARGE:  06/01/2008                               DISCHARGE SUMMARY   PRIMARY CARE PHYSICIAN:  Dr. Lovell Sheehan.   NEUROLOGIST:  Marolyn Hammock. Thad Ranger, MD, with [facility].   DISCHARGE DIAGNOSIS:  1. Transient ischemic attack-like symptoms possibly related to      migraine attack.  2. Chronic headaches.  3. Status post left subclavian stent put in 5 weeks back.  4. Tobacco abuse.  5. Chiari malformation type 1.  6. History of renal cell carcinoma of the solitary kidney.   DISCHARGE MEDICATIONS:  1. Depakote 250 mg p.o. q.a.m. and 500 mg p.o. q.p.m.  2. Aspirin 81 mg p.o. daily.  3. Plavix 75 mg p.o. daily.  4. Prilosec 30 mg p.o. daily.  5. Topamax 25 mg p.o. nightly.   COURSE DURING THE HOSPITAL STAY:  Ms. Cheek is a 55 year old pleasant  Caucasian lady, patient was admitted on May 29, 2008, with complaints of  slurred speech and altered gait.  In view of her recent history of stent  placed in the left subclavian artery, the patient was admitted to rule  out stroke and neurological deficits related to her left subclavian  stent.  The patient had CT scan of the head without contrast done on  admission, which showed no evidence of any acute intracranial  abnormality.  MRI/MRA of the brain done showed no acute abnormality.  An  MRA was negative.  The patient was also evaluated by neurologist.  Dr.  Thad Ranger on admission and as per the evaluation thought to be possibly  related also, could be related to her chronic migraine attacks.  After  the patient had been ruled out for stroke, the patient was given  treatment for her migraine with DHE protocol for 9 doses.  EEG was  also  done on admission, which showed abnormal EEG with basis of diffuse  background and slowing, nonspecific indicate of neuronal dysfunction may  be related to that may be on primary degenerative basis or underlying  static encephalopathy or toxic metabolic encephalopathy.  The patient at  present recovered well with her headaches with the DHE treatment and as  per discussions with Neurology, the patient would be medically stable to  be discharged and needs to follow up with Neurology in next 1-2 weeks.  The patient has been increased for her Depakote dose with 250 mg a.m.  and 500 mg p.m.  The patient at present is medically stable to be  discharged and can be discharged home.   RADIOLOGICAL INVESTIGATIONS DONE DURING THE STAY IN THE HOSPITAL:  1. CT head without contrast done on May 29, 2008.  Impression:  No      acute intracranial abnormality.  She had a Chiari malformation type      1.  2. Chest  x-ray done on May 29, 2008.  Impression:  Deformities from      old rib fractures once again simulate lung densities.  No active      disease.  3. MRA, MRI of the brain done on May 29, 2008.  MRI, no acute      intracranial abnormality.  She had a malformation type 1, stable.      MRA of the head.  Impression:  Negative.  4. EEG done on May 30, 2008.  Impression:  Abnormal EEG on the basis      of mild diffuse background slowing.  This is nonspecific indicator      of neuro dysfunction, may be on primary degenerative basis or may      be related to underlying static encephalopathy or toxic metabolic      encephalopathy.   LABORATORIES DONE ON DISCHARGE:  Total WBC 4.3, hemoglobin 12.5,  hematocrit 36.5, and platelets 123.  Sodium 146, potassium 4.2, chloride  112, bicarb 28, glucose 75, BUN 15, and creatinine 0.78.  Total  bilirubin 0.2, alkaline phosphatase 70, AST 19, ALT 9, total protein  5.2, albumin 2.6, and calcium 8.4.  Cardiac enzymes one set negative.  Total cholesterol 184,  triglyceride 164, HDL 23, LDL 128, and VLDL 33.  Vitamin B12 392, TSH 2.541, and homocysteine 7.0.  Valproic acid level  done on admission was 33.5.  UA is negative.  RPR nonreactive.   DISPOSITION:  The patient is medically stable to be discharged and can  be discharged home.  The patient is recommended to follow up with Dr.  Thad Ranger as an outpatient in next 1-2 weeks and with her primary care  physician, Dr. Lovell Sheehan in next 1 week.   TOTAL TIME FOR DISCHARGE:  50 minutes.      Monte Fantasia, MD  Electronically Signed     MP/MEDQ  D:  06/01/2008  T:  06/02/2008  Job:  [id number]   cc:   Dr. Beryle Lathe L. Thad Ranger, M.D.

## 2010-05-19 NOTE — Discharge Summary (Signed)
Wanda James, Wanda James                ACCOUNT NO.:  [id number]   MEDICAL RECORD NO.:  [id number]          PATIENT TYPE:  INP   LOCATION:  3108                         FACILITY:  [facility]   PHYSICIAN:  Sanjeev K. Deveshwar, M.D.DATE OF BIRTH:  1955-04-14   DATE OF ADMISSION:  04/25/2008  DATE OF DISCHARGE:  04/26/2008                               DISCHARGE SUMMARY   CHIEF COMPLAINT:  Subclavian artery stenosis.   HISTORY OF PRESENT ILLNESS:  This is a pleasant 55 year old female who  was admitted to [facility] on March 17, 2008, by Dr. Toniann Fail  for evaluation of a headache and weakness.  An MRI of the brain was  performed on March 18, 2008.  This showed a Chiari type I malformation  with a prominent subarachnoid FLAIR signal.  The patient was seen in  consultation by Dr. Pearlean Brownie on March 18, 2008.  A cerebral angiogram was  recommended.  The angiogram was performed by Dr. Corliss Skains on March 19, 2008.  This was consistent with a 75% stenosis of the left subclavian  artery with decreased hemodynamic flow to the left vertebrobasilar  junction and basilar artery.  The patient was also noted to have a  severe focal stenosis of the nondominant right vertebral artery at its  origin.   The patient was seen in consultation by Dr. Corliss Skains on March 29, 2008.  Treatment options along with risks and benefits were discussed.  A  decision was made to proceed with stent-assisted angioplasty and the  patient was admitted to [facility] on April 25, 2008, for that  procedure.   Past medical history is significant for chronic headaches.  She has a  history of depression, history of renal cell carcinoma, status post left  nephrectomy.  She has a history of a Chiari malformation.  She has  ongoing tobacco use with probable COPD.  She had an abnormal chest x-ray  with a questionable nodules.  A CT scan was recommended.  Apparently,  she had had an abnormal CT scan in 2000.   ALLERGIES:   The patient is allergic to PENICILLIN and CONTRAST MEDIA.  She apparently had a severe reaction to CONTRAST DYE.   MEDICATIONS AT THE ADMISSION:  Aspirin daily, Plavix 75 mg daily, which  was started just prior to admission in anticipation of the stenting.  She was also on Depakote Prilosec, Tylenol, BC Powders, and hydrocodone  p.r.n.  The patient was given a 13-hour prednisone protocol for her  history of contrast dye allergy.   SURGICAL HISTORY:  The patient is status post left nephrectomy for  carcinoma.  She is status post hysterectomy.  She states she has been  slow to wake up after general anesthesia in the past.   SOCIAL HISTORY:  The patient is married and lives in [place] with her  husband.  She continues to smoke three-quarters of a pack of cigarettes  per day.  She does not use alcohol.  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   FAMILY HISTORY:  Her mother died at age 99, the  cause was unknown.  Her  father died at age 22 from prostate cancer.  She has 5 brothers and 4  sisters all of whom are alive and well.   HOSPITAL COURSE:  As noted, this patient was admitted to [facility] on re-stenosis.  On the day of admission, she underwent stent-  assisted angioplasty of the left subclavian artery under monitored  anesthesia care performed by Dr. Corliss Skains.  The patient tolerated the  procedure well.  She was admitted to the Neuro Intensive Care Unit and  placed on IV heparin following the intervention.   The following day, the right femoral groin sheath was removed.  We  decided to perform a CT scan of the chest wall.  The patient was still  in the hospital to follow up on the previously noted pulmonary nodules.  This was performed just prior to discharge, the results are currently  pending.  The patient was noted to have a mildly low potassium, which  was supplemented prior to discharge.   Overall, it was felt that the patient tolerated the  procedure well with  good results.  She had a good radial pulse in the left arm, which was  equal to the right radial pulse.  At the time of discharge, she was  discharged in a stable and improved condition.   LABORATORY DATA:  On the day of discharge, the patient's metabolic panel  revealed a BUN of 11, potassium was 3.4 and this was supplemented.  Her  CBC on the day of discharge revealed a hemoglobin of 12.4, hematocrit  36, WBCs were 9.9, platelets were 159,000.  On April 25, 2008, her  hemoglobin was 13.  On April 22, 2008, her hemoglobin was 15.5.  A chest  x-ray on April 22, 2008, showed no evidence of a pulmonary infiltrate,  although there were bilateral nodular densities, which were felt to  possibly represent bilateral anterior rib fractures.   DISCHARGE INSTRUCTIONS:  The patient was told to continue on the same  medication she was on prior to admission.  She was to continue her  aspirin 81 mg daily all the weeks.  The patient was told to avoid any  strenuous activity.  She was not to lift anything more than 10 pounds.  She is to avoid turning her neck to any significant degree.  She was not  to drive for 2 weeks.  She was not to return to work for 2 weeks.  She  will follow up with Dr. Corliss Skains in approximately 2 weeks.  She was  also given instructions regarding wound care.   DISCHARGE DIAGNOSES:  1. Status post stent-assisted angioplasty of a left symptomatic left      subclavian artery stenosis.  2. 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  3. Chronic headaches.  4. Ongoing tobacco use with probable chronic obstructive pulmonary      disease.  5. Abnormal chest x-ray with followup CT scan pending.  6. History of depression.  7. History of renal cell carcinoma with solitary kidney.  8. Chiari I malformation.  9. Status post left nephrectomy for carcinoma.  10.Status post hysterectomy.  11.Contrast  dye allergy requiring prednisone prophylaxis as well as      Benadryl.  12.History of penicillin allergy.  13.History of pneumonia in March 2010.   ADDENDUM:  The chest CT has been read and it showed old bilateral  anterior rib fracture deformities corresponding with the nodular density  seen on a recent chest x-ray.  There were no suspicious pulmonary  nodules or masses identified.  The patient did have further progression  of chronic interstitial pneumonitis since 2006 with right lower lobe  interstitial fibrosis again noted.      Delton See, P.A.    ______________________________  Grandville Silos. Corliss Skains, M.D.    DR/MEDQ  D:  04/26/2008  T:  04/27/2008  Job:  [id number]   cc:   Dr. Loreta Ave, [facility], MD  Pramod P. Pearlean Brownie, MD

## 2010-05-22 NOTE — Group Therapy Note (Signed)
NAME:  Wanda James, Wanda James NO.:  [id number]   MEDICAL RECORD NO.:  [id number]                   PATIENT TYPE:  OUT   LOCATION:  [facility]                           FACILITY:  [facility]   PHYSICIAN:  Tinnie Gens, MD                     DATE OF BIRTH:  06/24/1955   DATE OF SERVICE:  04/04/2003                                    CLINIC NOTE   CHIEF COMPLAINT:  Ovarian cyst.   HISTORY OF PRESENT ILLNESS:  The patient is a 55 year old gravida 3 para 3-0-  0-3 who is status post a hysterectomy for bleeding.  She also has a history  of a renal cancer status post left nephrectomy who apparently has some  abdominal pain that seems to be upper in nature but in doing a workup for  this she was found to have some ovarian cysts on her left ovary and she does  report some pain in association with this.   PAST MEDICAL HISTORY:  Significant for renal disease, peptic ulcer disease,  history of pneumonia, hypertension.   PAST SURGICAL HISTORY:  She has history of partial hysterectomy, left  nephrectomy, nasal surgery, cyst removal on left breast, and wrist surgery.   ALLERGIES:  X-RAY DYE and PENICILLIN.   CURRENT MEDICATIONS:  Premarin cream.   GYNECOLOGICAL HISTORY:  She is status post hysterectomy.  No significant  history of abnormal Pap.  Last mammogram was November 2003, revealed the  cyst that she had operated on.   OBSTETRICAL HISTORY:  G3 P3.  SVD x3.   FAMILY HISTORY:  Coronary artery disease, colon cancer.   SOCIAL HISTORY:  She is a smoker, two packs per day.  She is a social  drinker as well.  She denies any drug use.   REVIEW OF SYSTEMS:  A 14-point review of systems reviewed.  Significant for  some bruising, muscle aches, fever and night sweats, fatigue, weight gain,  frequent headaches, cough with phlegm associated with smoking and shortness  of breath in that vein as well, some chest pain.  She does have also nausea  and vomiting, frequent  urination, loss of urine with coughing, and hot  flashes if she is not taking her Premarin.   PHYSICAL EXAMINATION TODAY:  VITAL SIGNS:  Temperature is 97, pulse 93,  blood pressure 127/85, weight 126.  GENERAL:  She is a thin white female who looks older than her stated age.  GENITOURINARY:  She has normal external female genitalia.  The vagina is  rugated and cuff is well healed.  No significant masses could be felt in the  adnexa.   LABORATORY DATA:  She has an ultrasound report from February 13, 2003 that  shows a small benign cyst 1.2 x 1.9 mm on the left ovary.  No mass or fluid  collection noted.   IMPRESSION:  1. Ovarian cyst.  2. Chronic abdominal pain.  PLAN:  The cyst appears benign as well as the fact that she has had multiple  surgeries; certainly I would not feel comfortable going in to get this  should it come to that.  I have advised her to have a follow-up ultrasound  and then if she does need surgery we will refer her to GYN oncology.                                               Tinnie Gens, MD    TP/MEDQ  D:  04/05/2003  T:  04/05/2003  Job:  [id number]

## 2010-06-22 ENCOUNTER — Emergency Department (HOSPITAL_COMMUNITY): Payer: BC Managed Care – PPO

## 2010-06-22 ENCOUNTER — Emergency Department (HOSPITAL_COMMUNITY)
Admission: EM | Admit: 2010-06-22 | Discharge: 2010-06-22 | Disposition: A | Discharge status: Home / Self Care | Payer: BC Managed Care – PPO | Attending: Emergency Medicine | Admitting: Emergency Medicine

## 2010-06-22 DIAGNOSIS — Z8673 Personal history of transient ischemic attack (TIA), and cerebral infarction without residual deficits: Secondary | ICD-10-CM | POA: Insufficient documentation

## 2010-06-22 DIAGNOSIS — J45909 Unspecified asthma, uncomplicated: Secondary | ICD-10-CM | POA: Insufficient documentation

## 2010-06-22 DIAGNOSIS — F17200 Nicotine dependence, unspecified, uncomplicated: Secondary | ICD-10-CM | POA: Insufficient documentation

## 2010-06-22 DIAGNOSIS — R05 Cough: Secondary | ICD-10-CM | POA: Insufficient documentation

## 2010-06-22 DIAGNOSIS — R059 Cough, unspecified: Secondary | ICD-10-CM | POA: Insufficient documentation

## 2010-06-22 DIAGNOSIS — Z85528 Personal history of other malignant neoplasm of kidney: Secondary | ICD-10-CM | POA: Insufficient documentation

## 2010-10-05 LAB — BASIC METABOLIC PANEL
CO2: 27
Chloride: 101
Glucose, Bld: 71
Potassium: 3.8
Sodium: 135

## 2010-10-05 LAB — CBC
HCT: 45.3
Hemoglobin: 14.9
MCHC: 32.9
RBC: 5.09
RDW: 15.8 — ABNORMAL HIGH

## 2010-10-05 LAB — DIFFERENTIAL
Basophils Absolute: 0.1
Basophils Relative: 1
Eosinophils Relative: 5
Monocytes Absolute: 0.7
Monocytes Relative: 8

## 2010-10-13 LAB — COMPREHENSIVE METABOLIC PANEL
ALT: 19
CO2: 28
Calcium: 9.2
Creatinine, Ser: 0.9
GFR calc Af Amer: >60
GFR calc non Af Amer: >60
Glucose, Bld: 104 — ABNORMAL HIGH
Sodium: 143
Total Protein: 6.4

## 2010-10-13 LAB — DIFFERENTIAL
Eosinophils Absolute: 0.5
Lymphocytes Relative: 25
Lymphs Abs: 1.8
Monocytes Relative: 6
Neutrophils Relative %: 62

## 2010-10-13 LAB — CBC
Hemoglobin: 13.9
MCHC: 33.4
MCV: 87.2
RDW: 15.5

## 2011-02-15 IMAGING — CT CT CHEST W/O CM
2 of 4 series · 15 of 36 positions shown, 18 images · non-contrast
Comparison: Chest radiograph on 04/22/2008.  Old chest CT on
08/22/2004

CLINICAL DATA: Bilateral pulmonary nodular opacities seen on chest
radiograph.  Evaluate for pulmonary mass.

CT CHEST WITHOUT CONTRAST
TECHNIQUE: Multidetector CT imaging of the chest was performed
following the standard protocol without IV contrast.

[Series 2: routine chest 5.0 st · axial · 0.61mm/px · z∈[+894,+1144]mm · 12 of 60 slices shown, 15 images]
[im 5/60  mediastinal]
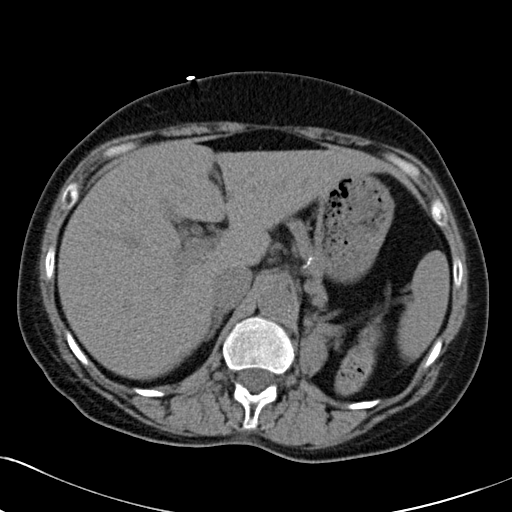
[im 5/60  lung]
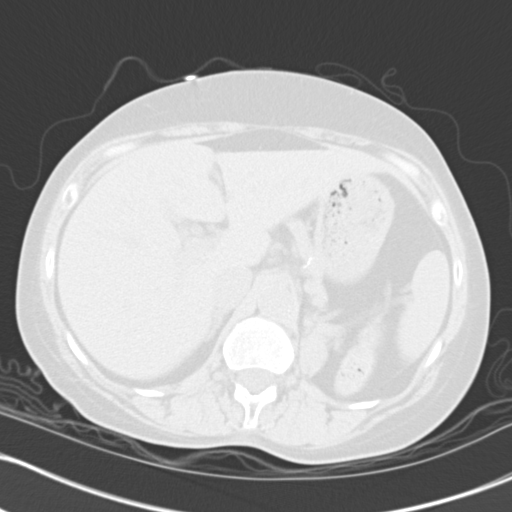
[im 9/60  lung]
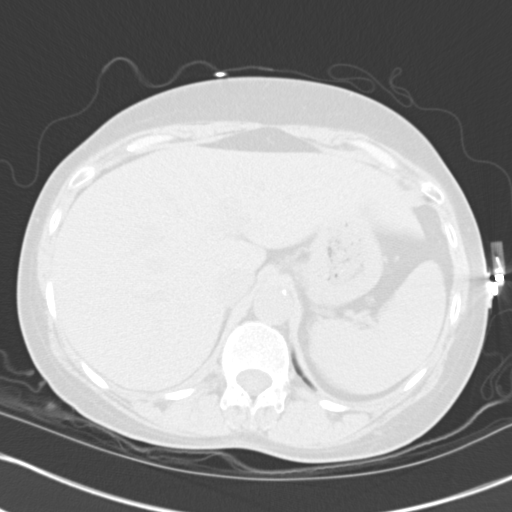
[im 13/60  lung]
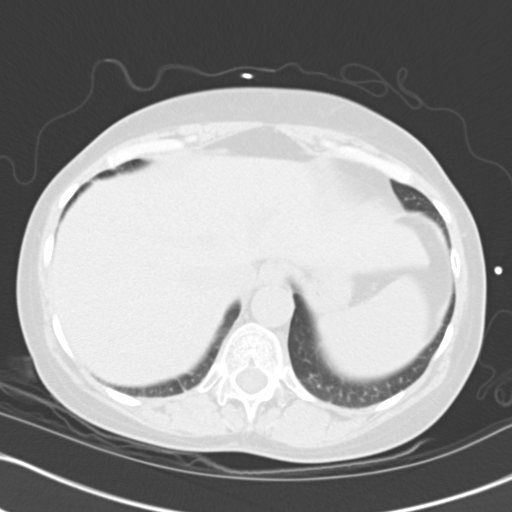
[im 17/60  lung]
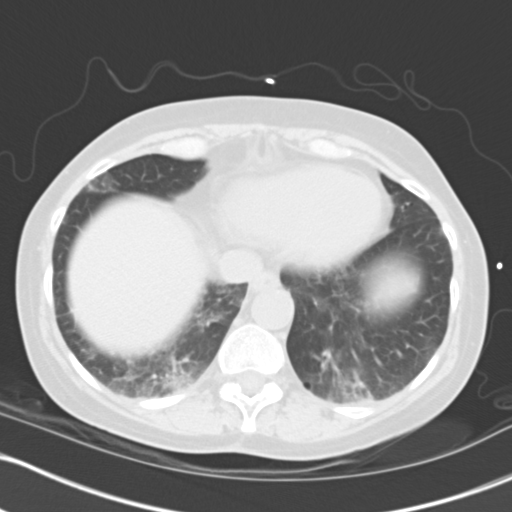
[im 22/60  mediastinal]
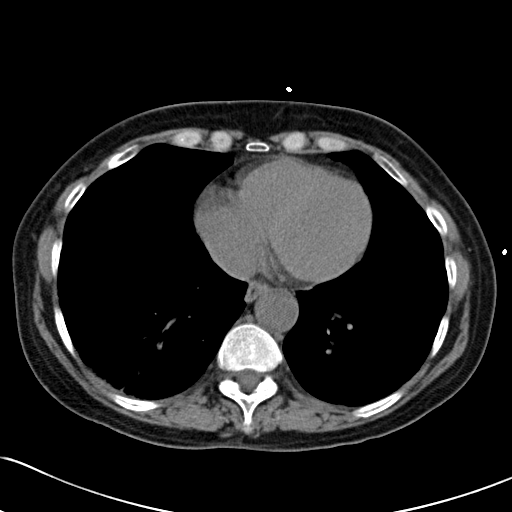
[im 22/60  lung]
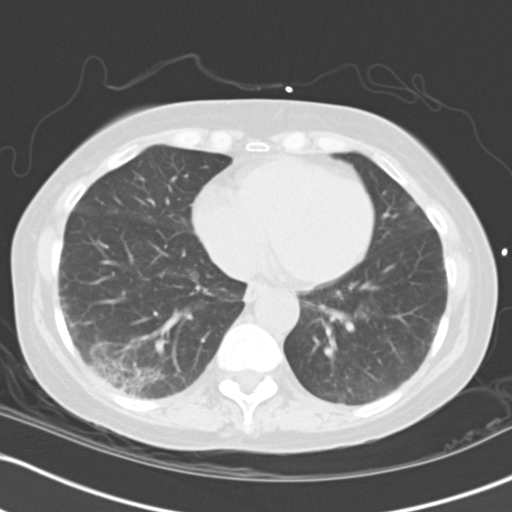
[im 26/60  lung]
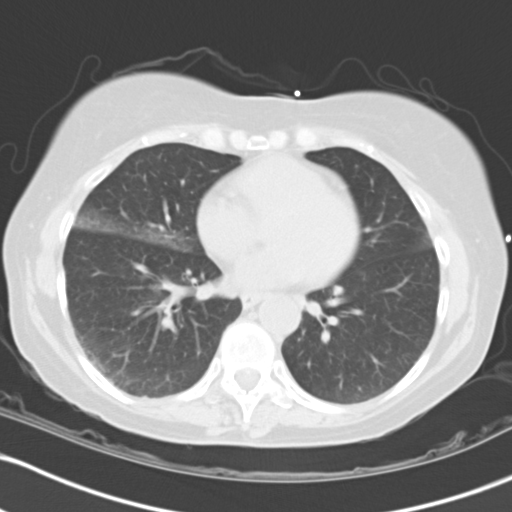
[im 34/60  lung]
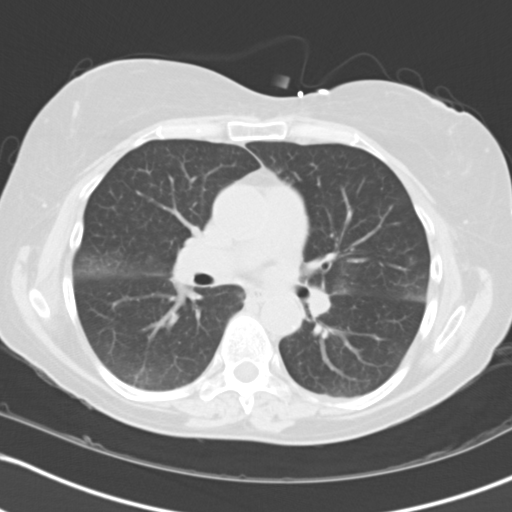
[im 38/60  lung]
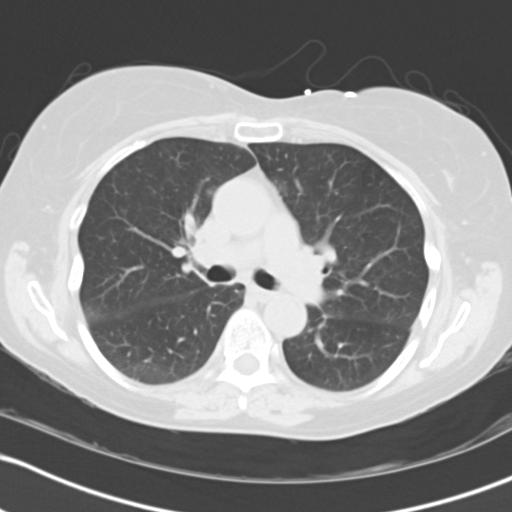
[im 43/60  mediastinal]
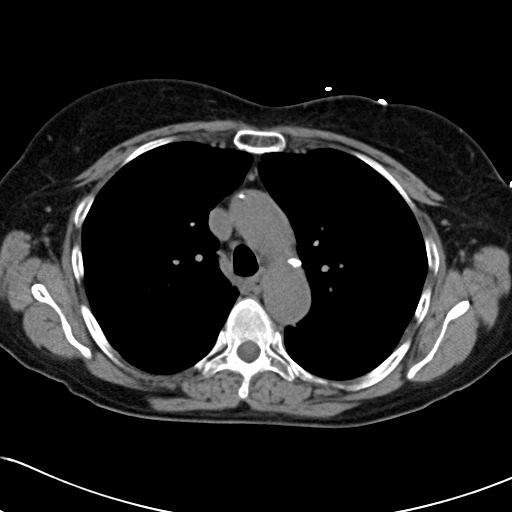
[im 43/60  lung]
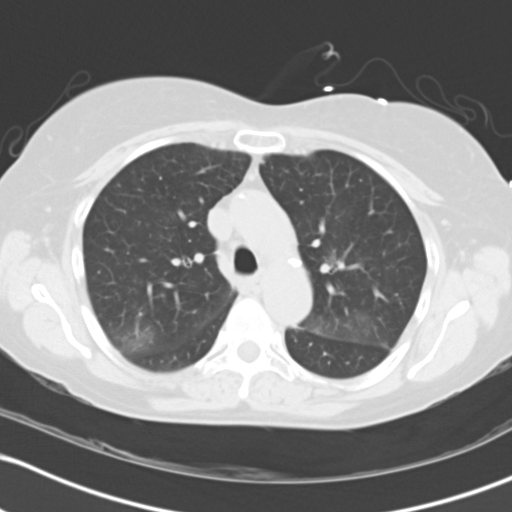
[im 47/60  lung]
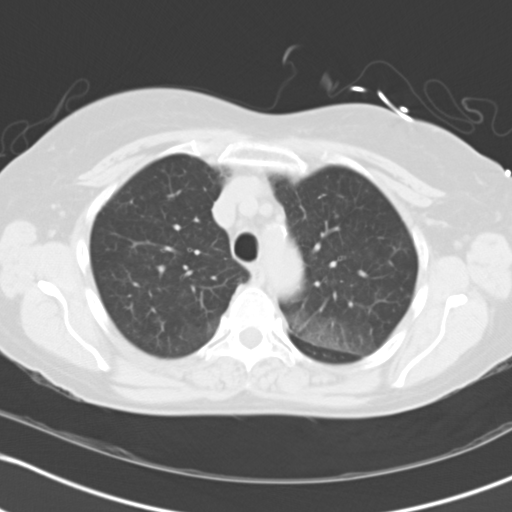
[im 51/60  lung]
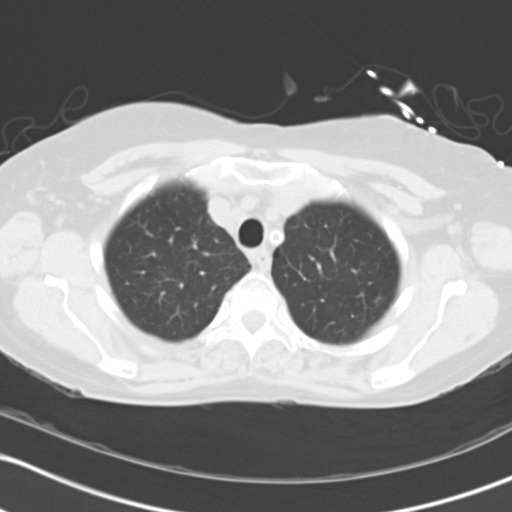
[im 55/60  lung]
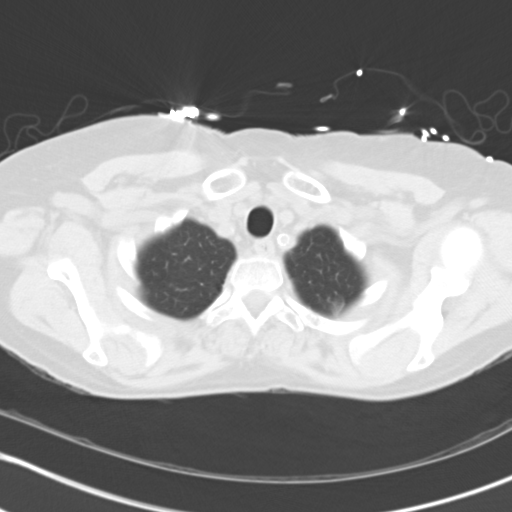

[Series 5: routine chest 2.0 st · coronal · 0.65mm/px · 3 of 103 slices shown]
[im 21/103  lung]
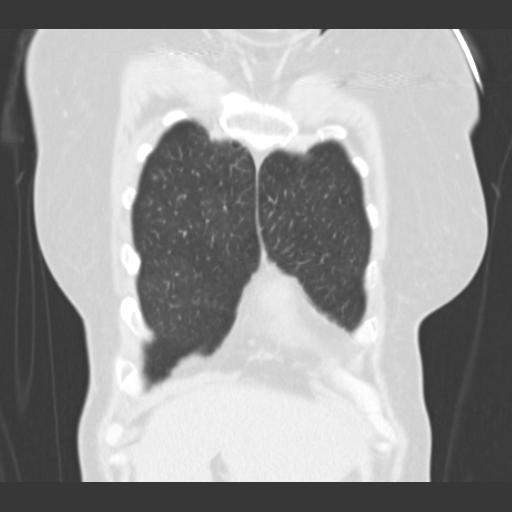
[im 41/103  lung]
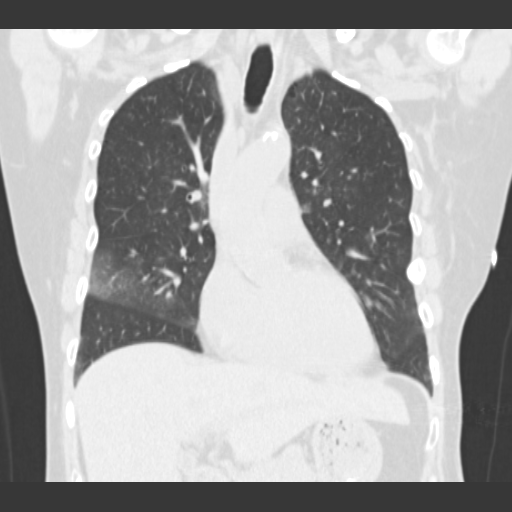
[im 62/103  lung]
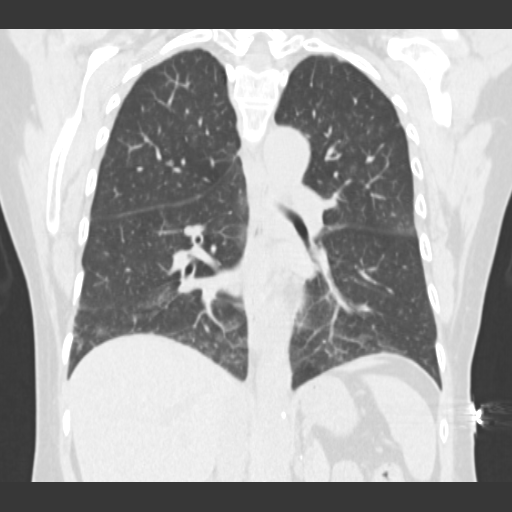

[15 of 36 positions shown; findings below may reference images not displayed]

FINDINGS: Multiple bilateral old anterior rib fracture -
deformities are seen, which correspond to the nodular density seen
on recent chest radiograph.  No suspicious pulmonary nodules or
masses are identified.

Areas of ground-glass pulmonary opacity are seen in subpleural lung
zones and both upper lobes, right middle lobe, and left lower lobe,
which are new since prior study in 9550.  Chronic interstitial
fibrosis is again seen in the peripheral right lower lobe which is
stable since prior study.  This is nonspecific, and consistent with
chronic interstitial pneumonitis.

There is no evidence of pleural or pericardial effusion.  There is
no evidence of hilar or mediastinal masses, or other areas of
adenopathy within the thorax.  Stent is seen in the proximal left
subclavian artery.
IMPRESSION: 1.  Old bilateral anterior rib fracture deformities, corresponding
with the nodular densities seen on recent chest radiograph.  No
suspicious pulmonary nodules or masses identified.
2.  Further progression of chronic interstitial pneumonitis since
9550, with right lower lobe interstitial fibrosis again noted.

## 2011-03-30 ENCOUNTER — Emergency Department (HOSPITAL_COMMUNITY): Payer: BC Managed Care – PPO

## 2011-03-30 ENCOUNTER — Emergency Department (HOSPITAL_COMMUNITY)
Admission: EM | Admit: 2011-03-30 | Discharge: 2011-03-30 | Disposition: A | Discharge status: Home / Self Care | Payer: BC Managed Care – PPO | Attending: Emergency Medicine | Admitting: Emergency Medicine

## 2011-03-30 ENCOUNTER — Encounter (HOSPITAL_COMMUNITY): Payer: Self-pay | Admitting: *Deleted

## 2011-03-30 DIAGNOSIS — Z85528 Personal history of other malignant neoplasm of kidney: Secondary | ICD-10-CM | POA: Insufficient documentation

## 2011-03-30 DIAGNOSIS — J449 Chronic obstructive pulmonary disease, unspecified: Secondary | ICD-10-CM | POA: Insufficient documentation

## 2011-03-30 DIAGNOSIS — J4489 Other specified chronic obstructive pulmonary disease: Secondary | ICD-10-CM | POA: Insufficient documentation

## 2011-03-30 DIAGNOSIS — K219 Gastro-esophageal reflux disease without esophagitis: Secondary | ICD-10-CM | POA: Insufficient documentation

## 2011-03-30 DIAGNOSIS — F17200 Nicotine dependence, unspecified, uncomplicated: Secondary | ICD-10-CM | POA: Insufficient documentation

## 2011-03-30 DIAGNOSIS — R091 Pleurisy: Secondary | ICD-10-CM | POA: Insufficient documentation

## 2011-03-30 HISTORY — DX: Gastro-esophageal reflux disease without esophagitis: K21.9

## 2011-03-30 HISTORY — DX: Malignant (primary) neoplasm, unspecified: C80.1

## 2011-03-30 MED ORDER — AZITHROMYCIN 250 MG PO TABS
500.0000 mg | ORAL_TABLET | Freq: Once | ORAL | Status: AC
Start: 2011-03-30 — End: 2011-03-30
  Administered 2011-03-30: 500 mg via ORAL
  Filled 2011-03-30: qty 2

## 2011-03-30 MED ORDER — AZITHROMYCIN 250 MG PO TABS: 250.0000 mg | ORAL_TABLET | Freq: Every day | ORAL | Status: AC

## 2011-03-30 MED ORDER — ALBUTEROL SULFATE HFA 108 (90 BASE) MCG/ACT IN AERS
2.0000 | INHALATION_SPRAY | RESPIRATORY_TRACT | Status: DC | PRN
  Filled 2011-03-30: qty 6.7

## 2011-03-30 MED ORDER — PREDNISONE 10 MG PO TABS: 20.0000 mg | ORAL_TABLET | Freq: Every day | ORAL | Status: DC

## 2011-03-30 MED ORDER — HYDROCODONE-ACETAMINOPHEN 5-500 MG PO TABS: 1.0000 | ORAL_TABLET | Freq: Four times a day (QID) | ORAL | Status: AC | PRN

## 2011-03-30 MED ORDER — PREDNISONE 20 MG PO TABS
60.0000 mg | ORAL_TABLET | Freq: Once | ORAL | Status: AC
  Administered 2011-03-30: 60 mg via ORAL
  Filled 2011-03-30: qty 3

## 2011-03-30 MED ORDER — ALBUTEROL SULFATE (5 MG/ML) 0.5% IN NEBU
2.5000 mg | INHALATION_SOLUTION | Freq: Once | RESPIRATORY_TRACT | Status: AC
  Administered 2011-03-30: 2.5 mg via RESPIRATORY_TRACT
  Filled 2011-03-30: qty 0.5

## 2011-03-30 NOTE — ED Provider Notes (Signed)
History     CSN: [id number]  Arrival date & time 03/30/11  1325   First MD Initiated Contact with Patient 03/30/11 1625      Chief Complaint  Patient presents with  . Cough    (Consider location/radiation/quality/duration/timing/severity/associated sxs/prior treatment) HPI Comments: She has a nonproductive chronic cough for the past month for the past, week.  She's had right rib pain.  She does not have a primary care physician.  Does not have fever over 97 although her partner states this is a fever for her.  She does have upper respiratory tract symptoms, nasal congestion, postnasal drip  Patient is a 56 y.o. female presenting with cough. The history is provided by the patient.  Cough This is a chronic problem. The current episode started more than 1 week ago. The problem occurs every few minutes. The problem has not changed since onset.The cough is non-productive. There has been no fever. Associated symptoms include chest pain and rhinorrhea. Pertinent negatives include no chills, no sore throat, no shortness of breath and no wheezing. She has tried nothing for the symptoms. She is a smoker. Her past medical history is significant for COPD.    Past Medical History  Diagnosis Date  . Cancer     kidney  . Acid reflux     Past Surgical History  Procedure Date  . Carotidendarterectomy   . Nephrectomy     left  . Abdominal hysterectomy     partial  . Inner ear surgery     right    No family history on file.  History  Substance Use Topics  . Smoking status: Current Everyday Smoker -- 1.0 packs/day  . Smokeless tobacco: Not on file  . Alcohol Use: No    OB History    Grav Para Term Preterm Abortions TAB SAB Ect Mult Living                  Review of Systems  Constitutional: Negative for fever and chills.  HENT: Positive for congestion and rhinorrhea. Negative for sore throat.   Respiratory: Positive for cough. Negative for shortness of breath and wheezing.     Cardiovascular: Positive for chest pain.  Neurological: Negative for dizziness.    Allergies  Iohexol and Penicillins  Home Medications   Current Outpatient Rx  Name Route Sig Dispense Refill  . ASPIRIN EC 81 MG PO TBEC Oral Take 81 mg by mouth daily.    . BC HEADACHE POWDER PO Oral Take 1 packet by mouth 3 (three) times daily as needed. For pain/headaches.    Marland Kitchen NICOTINE 21 MG/24HR TD PT24 Transdermal Place 1 patch onto the skin daily.    Marland Kitchen OMEPRAZOLE 20 MG PO CPDR Oral Take 20 mg by mouth daily.    . AZITHROMYCIN 250 MG PO TABS Oral Take 1 tablet (250 mg total) by mouth daily. 4 tablet 0  . HYDROCODONE-ACETAMINOPHEN 5-500 MG PO TABS Oral Take 1-2 tablets by mouth every 6 (six) hours as needed for pain. 15 tablet 0  . PREDNISONE 10 MG PO TABS Oral Take 2 tablets (20 mg total) by mouth daily. 15 tablet 0    BP 130/73  Pulse 85  Temp(Src) 97.6 F (36.4 C) (Oral)  Resp 18  Wt 139 lb (63.05 kg)  SpO2 98%  Physical Exam  Constitutional: She is oriented to person, place, and time. She appears well-developed and well-nourished.  HENT:  Head: Normocephalic.  Mouth/Throat: Uvula is midline.  Eyes: Pupils are equal,  round, and reactive to light.  Neck: Normal range of motion.  Cardiovascular: Normal rate.   Pulmonary/Chest: Effort normal. No respiratory distress. She has no wheezes. She has no rales. She exhibits tenderness.  Abdominal: She exhibits no distension.  Musculoskeletal: Normal range of motion.  Neurological: She is alert and oriented to person, place, and time.  Skin: Skin is warm.    ED Course  Procedures (including critical care time)  Labs Reviewed - No data to display Dg Chest 2 View  03/30/2011  *RADIOLOGY REPORT*  Clinical Data: Cough  CHEST - 2 VIEW  Comparison: 06/22/2010  Findings: Cardiomediastinal silhouette is stable.  Mild hyperinflation again noted.  No acute infiltrate or pleural effusion.  No pulmonary edema.  IMPRESSION: Stable COPD.  No active  disease.  Original Report Authenticated By: Natasha Mead, M.D.     1. COPD UNSPECIFIED   2. Pleurisy       MDM  X-ray reveals COPD patient is a smoker, so this is most likely scenario.  There is no evidence of rib fractures or pneumonia will treat with albuterol and steroids and reevaluate patient        Arman Filter, NP 03/31/11 1505

## 2011-03-30 NOTE — Discharge Instructions (Signed)
Chronic Obstructive Pulmonary Disease Chronic obstructive pulmonary disease (COPD) is a condition in which airflow from the lungs is restricted. The lungs can never return to normal, but there are measures you can take which will improve them and make you feel better. CAUSES   Smoking.   Exposure to secondhand smoke.   Breathing in irritants (pollution, cigarette smoke, strong smells, aerosol sprays, paint fumes).   History of lung infections.  TREATMENT  Treatment focuses on making you comfortable (supportive care). Your caregiver may prescribe medications (inhaled or pills) to help improve your breathing. HOME CARE INSTRUCTIONS   If you smoke, stop smoking.   Avoid exposure to smoke, chemicals, and fumes that aggravate your breathing.   Take antibiotic medicines as directed by your caregiver.   Avoid medicines that dry up your system and slow down the elimination of secretions (antihistamines and cough syrups). This decreases respiratory capacity and may lead to infections.   Drink enough water and fluids to keep your urine clear or pale yellow. This loosens secretions.   Use humidifiers at home and at your bedside if they do not make breathing difficult.   Receive all protective vaccines your caregiver suggests, especially pneumococcal and influenza.   Use home oxygen as suggested.   Stay active. Exercise and physical activity will help maintain your ability to do things you want to do.   Eat a healthy diet.  SEEK MEDICAL CARE IF:   You develop pus-like mucus (sputum).   Breathing is more labored or exercise becomes difficult to do.   You are running out of the medicine you take for your breathing.  SEEK IMMEDIATE MEDICAL CARE IF:   You have a rapid heart rate.   You have agitation, confusion, tremors, or are in a stupor (family members may need to observe this).   It becomes difficult to breathe.   You develop chest pain.   You have a fever.  MAKE SURE YOU:    Understand these instructions.   Will watch your condition.   Will get help right away if you are not doing well or get worse.  Document Released: 09/30/2004 Document Revised: 12/10/2010 Document Reviewed: 02/20/2010 [facility] Patient Information 2012 [place], [place].Pleurisy Pleurisy is an inflammation and swelling of the lining of the lungs. It usually is the result of an underlying infection or other disease. Because of this inflammation, it hurts to breathe. It is aggravated by coughing or deep breathing. The primary goal in treating pleurisy is to diagnose and treat the condition that caused it.  HOME CARE INSTRUCTIONS   Only take over-the-counter or prescription medicines for pain, discomfort, or fever as directed by your caregiver.   If medications which kill germs (antibiotics) were prescribed, take the entire course. Even if you are feeling better, you need to take them.   Use a cool mist vaporizer to help loosen secretions. This is so the secretions can be coughed up more easily.  SEEK MEDICAL CARE IF:   Your pain is not controlled with medication or is increasing.   You have an increase inpus like (purulent) secretions brought up with coughing.  SEEK IMMEDIATE MEDICAL CARE IF:   You have blue or dark lips, fingernails, or toenails.   You begin coughing up blood.   You have increased difficulty breathing.   You have continuing pain unrelieved by medicine or lasting more than 1 week.   You have pain that radiates into your neck, arms, or jaw.   You develop increased shortness of  breath or wheezing.   You develop a fever, rash, vomiting, fainting, or other serious complaints.  Document Released: 12/21/2004 Document Revised: 12/10/2010 Document Reviewed: 07/22/2006 [employer] Patient Information 2012 [employer],Using a Nebulizer If you have asthma or other breathing problems, you might need to breathe in (inhale) medication. This can be done with a nebulizer. A nebulizer  is a container that turns liquid medication into a mist that you can inhale. There are different kinds of nebulizers. Most are small. With some, you breathe in through a mouthpiece. With others, a mask fits over your nose and mouth. Most nebulizers must be connected to a small air compressor. Some compressors can run on a battery or can be plugged into an electrical outlet. Air is forced through tubing from the compressor to the nebulizer. The forced air changes the liquid into a fine spray. PREPARATION  Check your medication. Make sure it has not expired and is not damaged in any way.   Wash your hands with soap and water.   Put all the parts of your nebulizer on a sturdy, flat surface. Make sure the tubing connects the compressor and the nebulizer.   Measure the liquid medication according to your caregiver's instructions. Pour it into the nebulizer.   Attach the mouthpiece or mask.   To test the nebulizer, turn it on to make sure a spray is coming out. Then, turn it off.  USING THE NEBULIZER  When using your nebulizer, remember to:   Sit down.   Stay relaxed.   Stop the machine if you start coughing.   Stop the machine if the medication foams or bubbles.   To begin:   If your nebulizer has a mask, put it over your nose and mouth. If you use a mouthpiece, put it in your mouth. Press your lips firmly around the mouthpiece.   Turn on the nebulizer.   Some nebulizers have a finger valve. If yours does, cover up the air hole so the air gets to the nebulizer.   Breathe out.   Once the medication begins to mist out, take slow, deep breaths. If there is a finger valve, release it at the end of your breath.   Continue taking slow, deep breaths until the nebulizer is empty.  HOME CARE INSTRUCTIONS  The nebulizer and all its parts must be kept very clean. Follow the manufacturer's instructions for cleaning. With most nebulizers, you should:  Wash the nebulizer after each use. Use  warm water and soap. Rinse it well. Shake the nebulizer to remove extra water. Put it on a clean towel until itis completely dry. To make sure it is dry, put the nebulizer back together. Turn on the compressor for a few minutes. This will blow air through the nebulizer.   Do not wash the tubing or the finger valve.   Store the nebulizer in a dust-free place.   Inspect the filter every week. Replace it any time it looks dirty.   Sometimes the nebulizer will need a more complete cleaning. The instruction booklet should say how often you need to do this.  POSSIBLE COMPLICATIONS The nebulizer might not produce mist, or foam might come out. Sometimes a filter can get clogged or there might be a problem with the air compressor. Parts are usually made of plastic and will wear out. Over time, you may need to replace some of the parts. Check the instruction booklet that came with your nebulizer. It should tell you how to fix problems or  who to call for help. The nebulizer must work properly for it to aid your breathing. Have at least 1 extra nebulizer at home. That way, you will always have one when you need it. SEEK MEDICAL CARE IF:   You continue to have difficulty breathing.   You have trouble using the nebulizer.  Document Released: 12/09/2008 Document Revised: 12/10/2010 Document Reviewed: 12/09/2008 [facility] Patient Information 2012 [place], [place]. Please take the stairs as directed until completion of also been provided with an albuterol inhaler.  Please use it as follows 2 puffs every 4-6 hours while awake if you wake during the night with coughing.  He continues again to do, not such a long talk to wake up after 2 days of regular use and use it as needed for coughing episodes

## 2011-03-30 NOTE — ED Notes (Signed)
Pt states "been coughing x 1 wk, don't have a primary MD, having left rib pain, not coughing anything up"

## 2011-03-30 NOTE — ED Notes (Signed)
Pt. C/o left lower rib pain.  Pt. Has had cough for one month.

## 2011-03-31 NOTE — ED Provider Notes (Signed)
Medical screening examination/treatment/procedure(s) were performed by non-physician practitioner and as supervising physician I was immediately available for consultation/collaboration.  Mattew Chriswell, MD 03/31/11 1548 

## 2013-02-17 ENCOUNTER — Emergency Department (HOSPITAL_COMMUNITY)
Admission: EM | Admit: 2013-02-17 | Discharge: 2013-02-17 | Disposition: A | Discharge status: Home / Self Care | Payer: BC Managed Care – PPO | Attending: Emergency Medicine | Admitting: Emergency Medicine

## 2013-02-17 ENCOUNTER — Encounter (HOSPITAL_COMMUNITY): Payer: Self-pay | Admitting: Emergency Medicine

## 2013-02-17 ENCOUNTER — Emergency Department (HOSPITAL_COMMUNITY): Payer: BC Managed Care – PPO

## 2013-02-17 DIAGNOSIS — Z880 Allergy status to penicillin: Secondary | ICD-10-CM | POA: Insufficient documentation

## 2013-02-17 DIAGNOSIS — F17200 Nicotine dependence, unspecified, uncomplicated: Secondary | ICD-10-CM | POA: Insufficient documentation

## 2013-02-17 DIAGNOSIS — Z85528 Personal history of other malignant neoplasm of kidney: Secondary | ICD-10-CM | POA: Insufficient documentation

## 2013-02-17 DIAGNOSIS — S82839A Other fracture of upper and lower end of unspecified fibula, initial encounter for closed fracture: Secondary | ICD-10-CM

## 2013-02-17 DIAGNOSIS — Y939 Activity, unspecified: Secondary | ICD-10-CM | POA: Insufficient documentation

## 2013-02-17 DIAGNOSIS — Z79899 Other long term (current) drug therapy: Secondary | ICD-10-CM | POA: Insufficient documentation

## 2013-02-17 DIAGNOSIS — K219 Gastro-esophageal reflux disease without esophagitis: Secondary | ICD-10-CM | POA: Insufficient documentation

## 2013-02-17 DIAGNOSIS — Z7982 Long term (current) use of aspirin: Secondary | ICD-10-CM | POA: Insufficient documentation

## 2013-02-17 DIAGNOSIS — S82899A Other fracture of unspecified lower leg, initial encounter for closed fracture: Secondary | ICD-10-CM | POA: Insufficient documentation

## 2013-02-17 DIAGNOSIS — X500XXA Overexertion from strenuous movement or load, initial encounter: Secondary | ICD-10-CM | POA: Insufficient documentation

## 2013-02-17 DIAGNOSIS — Y92009 Unspecified place in unspecified non-institutional (private) residence as the place of occurrence of the external cause: Secondary | ICD-10-CM | POA: Insufficient documentation

## 2013-02-17 DIAGNOSIS — W172XXA Fall into hole, initial encounter: Secondary | ICD-10-CM | POA: Insufficient documentation

## 2013-02-17 MED ORDER — OXYCODONE-ACETAMINOPHEN 5-325 MG PO TABS: 1.0000 | ORAL_TABLET | Freq: Three times a day (TID) | ORAL | Status: DC | PRN

## 2013-02-17 NOTE — ED Notes (Signed)
Pt reports falling while outside her home about one hour ago. Pt states she stepped into a hole, which made her fall. Pt denies hitting her head or having a LOC. Pt reports has moderate swelling to the lateral aspect of the knee. Pt has strong pedal pulses and capillary refill less than 2 seconds. Pt is A/O x4, in NAD, and vitals are WDL.

## 2013-02-17 NOTE — Discharge Instructions (Signed)
Please call and set-up an appointment with Dr. Tonita Cong, orthopedics Please rest and stay hydrated Please avoid any physical or strenuous activity Please keep boot on at all times While resting can un-velcro the boot and apply ice. Please keep leg elevated - toes above nose. Most importance to ice very frequently within the next 72 hours Please take pain medications as prescribed - while on pain medications there is to be no drinking alcohol, driving, operating any heavy machinery. If there is extra please dispose in a proper manner. Please avoid taking any extra tylenol for this can lead to tylenol overdose and liver issues.  Please continue to monitor symptoms closely and if symptoms are to worsen or change (fever greater than 101, chills, sweating, nausea, vomiting, diarrhea, fall, injury, numbness, tingling, loss of sensation, changes to colors, worsening swelling, redness, red streaks, warmth to touch) please report back to the ED immediately    Fibular Fracture, Ankle, Adult, Treated With or Without Immobilization A fibular fracture at your ankle is a break (fracture) bone in the smallest of the two bones in your lower leg, located on the outside of your leg (fibula) close to the area at your ankle joint. CAUSES  Rolling your ankle.  Twisting your ankle.  Extreme flexing or extending of your foot.  Severe force on your ankle as when falling from a distance. RISK FACTORS  Jumping activities.  Participation in sports.  Osteoporosis.  Advanced age.  Previous ankle injuries. SIGNS AND SYMPTOMS  Pain.  Swelling.  Inability to put weight on injured ankle.  Bruising.  Bone deformities at site of injury. DIAGNOSIS  This fracture is diagnosed with the help of an X-ray exam. TREATMENT  If the fractured bone did not move out of place it usually will heal without problems and does casting or splinting. If immobilization is needed for comfort or the fractured bone moved out of  place and will not heal properly with immobilization, a cast or splint will be used. HOME CARE INSTRUCTIONS   Apply ice to the area of injury:  Put ice in a plastic bag.  Place a towel between your skin and the bag.  Leave the ice on for 20 minutes, 2 3 times a day.  Use crutches as directed. Resume walking without crutches as directed by your health care provider.  Only take over-the-counter or prescription medicines for pain, discomfort, or fever as directed by your health care provider.  If you have a removable splint or boot, do not remove the boot unless directed by your health care provider. SEEK MEDICAL CARE IF:   You have continued pain or more swelling  The medications do not control the pain. SEEK IMMEDIATE MEDICAL CARE IF:  You develop severe pain in the leg or foot.  Your skin or nails below the injury turn blue or grey or feel cold or numb. MAKE SURE YOU:   Understand these instructions.  Will watch your condition.  Will get help right away if you are not doing well or get worse. Document Released: 12/21/2004 Document Revised: 10/11/2012 Document Reviewed: 08/02/2012 [facility] Patient Information 2014 [employer].   Emergency Department Resource Guide 1) Find a Doctor and Pay Out of Pocket Although you won't have to find out who is covered by your insurance plan, it is a good idea to ask around and get recommendations. You will then need to call the office and see if the doctor you have chosen will accept you as a new patient and what  types of options they offer for patients who are self-pay. Some doctors offer discounts or will set up payment plans for their patients who do not have insurance, but you will need to ask so you aren't surprised when you get to your appointment.  2) Contact Your Local Health Department Not all health departments have doctors that can see patients for sick visits, but many do, so it is worth a call to see if yours does. If you  don't know where your local health department is, you can check in your phone book. The CDC also has a tool to help you locate your state's health department, and many state websites also have listings of all of their local health departments.  3) Find a [place] [facility] If your illness is not likely to be very severe or complicated, you may want to try a walk in clinic. These are popping up all over the country in pharmacies, drugstores, and shopping centers. They're usually staffed by nurse practitioners or physician assistants that have been trained to treat common illnesses and complaints. They're usually fairly quick and inexpensive. However, if you have serious medical issues or chronic medical problems, these are probably not your best option.  No Primary Care Doctor: - Call Health Connect at  [phone] - they can help you locate a primary care doctor that  accepts your insurance, provides certain services, etc. - Physician Referral Service- [phone]  Chronic Pain Problems: Organization         Address  Phone   Notes  [facility]  [phone] Patients need to be referred by their primary care doctor.   Medication Assistance: Organization         Address  Phone   Notes  [facility] Medication [facility] [address] [place], [place] [address] [phone] --Must be a resident of [facility] -- Must have NO insurance coverage whatsoever (no Medicaid/ Medicare, etc.) -- The pt. MUST have a primary care doctor that directs their care regularly and follows them in the community   MedAssist  [phone]   [employer]  [phone]    Agencies that provide inexpensive medical care: Organization         Address  Phone   Notes  [place]  [phone]   [facility]    [phone]   [facility] [place],  [address] [phone]   [place] [address] [place] [phone]   Planned Parenthood    [phone]   [facility]    [phone]   Second [place] and [place] [address], [place] Phone:  [phone], Fax:  [phone] Hours of Operation:  9 am - 6 pm, M-F.  Also accepts Medicaid/Medicare and self-pay.  [facility] for [place], [address], [place] Phone: [phone], Fax: [phone]. Hours of Operation:  8:30 am - 5:30 pm, M-F.  Also accepts Medicaid and self-pay.  [facility] [address], [place] Phone: [phone]   [place], [place], [place] [phone], Ext. [phone] Mondays & Thursdays: 7-9 AM.  First 15 patients are seen on a first come, first serve basis.    [place] Providers:  Patent attorney  Notes  [facility] [address], [place] [phone] Also accepts self-pay patients.  [facility] [address], [place]  [phone]   [place], [address], [place] [phone]   [facility] [address], [place] [phone]   Lucianne Lei [address], [place]   [phone] Only accepts [place] Access [place] patients after they have their name applied to their card.   Self-Pay (no insurance) in [facility]:  Organization         Address  Phone   Notes  Sickle Cell Patients, [facility] [place] [phone]   [facility] [place] [phone]   [facility] [place]  [place], [place], [place] [phone]   [facility]/Dr. Osei-Bonsu  [address] [place] or [address], [place] [phone] Phone  number for both [place] and [place] locations is the same.  [facility] [address] [place] [phone]   [facility] [address], [place] or [address] [phone]   [facility] [address] [place] [phone], phone; [phone], fax Sees patients 1st and 3rd Saturday of every month.  Must not qualify for public or private insurance (i.e. Medicaid, Medicare, [place] Health Choice, Veterans' Benefits)  Household income should be no more than 200% of the poverty level The clinic cannot treat you if you are pregnant or think you are pregnant  Sexually transmitted diseases are not treated at the clinic.    Dental Care: Organization         Address  Phone  Notes  [facility] Department of [facility] [place] [phone] Accepts children up to age 21 who are enrolled in [place] or [place]; pregnant women with a Medicaid card; and children who have applied for Medicaid or [place] Health Choice, but were declined, whose parents can pay a reduced fee at time of service.  [facility] Department of [facility]  [address], [place] [phone] Accepts children up to age 34 who are enrolled in [place] or [place]; pregnant women with a Medicaid card; and children who have applied for Medicaid or [place] Health Choice, but were declined, whose parents can pay a reduced fee at time of service.  [place] Adult Dental Access PROGRAM  [place] [phone] Patients are seen by appointment only. Walk-ins are not accepted. [place] will see patients 42 years of age and older. Monday - Tuesday (8am-5pm) Most Wednesdays (8:30-5pm) $30 per visit, cash only  [facility] Adult Dental Access PROGRAM  [address], [facility] [phone] Patients are seen by appointment only.  Walk-ins are not accepted. [place] will see patients 29 years of age and older. One Wednesday Evening (Monthly: Volunteer Based).  $30 per visit, cash only  [place]  [phone] for adults; Children under age 69, call Graduate Pediatric Dentistry at [phone]. Children aged 34-14, please call [phone] to request a pediatric application.  Dental services are provided in all areas of dental care including fillings, crowns and bridges, complete  and partial dentures, implants, gum treatment, root canals, and extractions. Preventive care is also provided. Treatment is provided to both adults and children. Patients are selected via a lottery and there is often a waiting list.   [facility] [address] [place]  [phone] [email or web address]   [facility] [address] [place], [place] [phone], Ext. [phone] Second and Fourth Thursday of each month, opens at 6:30 AM; Clinic ends at 9 AM.  Patients are seen on a first-come first-served basis, and a limited number are seen during each clinic.   [facility]  [address] Hillard Danker [place], [place] [phone]   Eligibility Requirements You must have lived in [place], [place], or [place] counties for at least the last three months.   You cannot be eligible for state or federal sponsored [employer], including [employer], [place], or [employer].   You generally cannot be eligible for healthcare insurance through your employer.    How to apply: Eligibility screenings are held every Tuesday and Wednesday afternoon from 1:00 pm until 4:00 pm. You do not need an appointment for the interview!  [facility] [address], [place], [place]   [place]  [place] Department  [place]  [phone]    Behavioral Health  Resources in the Community: Intensive Outpatient Programs Organization         Address  Phone  Notes  [place]. [address] [place], [place] [phone]   [facility] [address] [place], [place]   ADS: Alcohol & Drug Svcs [address] [place], [place]   [place] [address]  [place], [place] or [phone]   Substance Abuse Resources Organization         Address  Phone  Notes  Alcohol and Drug Services  [phone]   [place]  [phone]   [facility]   Chinita Pester  [phone]   Residential & Outpatient Substance Abuse Program  6415981240   Psychological Services Organization         Address  Phone  Notes  [facility] [place]  [place]  [phone]   [place] [address], [place] or [phone]    Mobile Crisis Teams Organization         Address  Phone  Notes  [facility], Mobile Crisis Care Unit  [phone]   [facility] Psych[facility]  [address] [place], [place]   [place] [address], [place] [phone]    Self-Help/Support Groups Organization         Address  Phone             Notes  [place]. of [place] - variety of support groups  [place] Call for more information  Narcotics Anonymous (NA), [facility] [address], [employer] [place]  2 meetings at this location   Special educational needs teacher         Address  Phone  Notes  ASAP Residential Treatment [place],    [place]  [place]  [address] [place] [phone], [place], [place]   [place], [place] [phone] Admissions: 8am-3pm M-F  Incentives Substance [place] [address]    [place], [place] [phone]   [facility] [address] [place], [place], [place]   [facility] [address],  [place], [place]   [facility]ight Programs - Intensive Outpatient [address] Kristeen [address], [place], [place]   [facility] ([place].) [place].,  [place], [place] [phone] or [phone]   [facility] Services (RTS) [address], [place], [place] Accepts Medicaid  [facility] [place] [address],  [place] [phone] Substance Abuse/Addiction Treatment   [employer] Organization         Address  Phone  Notes  [facility]  [phone]   Domenic Schwab, PhD [address] Arlis Porta [place], [place]   [phone] or [phone]   [facility], [place] [phone]   [facility] [address], [place], [place] [phone] Insurance/Medicaid/sponsorship through [facility] and Families [address]                                    [place], [place] [phone] [place] [address], [place] [phone]    Dr. Adele Schilder  [phone]   [facility] Dept. 1) [address], [place] 2) [place] 3)  [place] [address], [place] [phone]  [phone]   [place] [phone] or [phone] (After Hours)

## 2013-02-17 NOTE — ED Provider Notes (Signed)
CSN: [id number]     Arrival date & time 02/17/13  1810 History  This chart was scribed for Wanda Mead, PA-C, non-physician practitioner working with Houston Siren III, * by Vernell Barrier, ED scribe. This patient was seen in room WTR8/WTR8 and the patient's care was started at 7:10 PM.  Chief Complaint  Patient presents with  . Ankle Pain   The history is provided by the patient. No language interpreter was used.   HPI Comments: Wanda James is a 58 y.o. Wanda who presents to the Emergency Department complaining of a fall, no head impact or LOC occuring 1.5 hours ago. Pt states she stepped in a hole outside her home, twisted her ankle, and fell. States she twisted her ankle when she fell and she heard a pop upon impact. She now reports left ankle pain. Pain worse with pressure. Rates pain 9/10. States toes on the right foot feel tight and stiff but has not lost sensation. Pt broke the same ankle approximately 20 years ago. Denies numbness.   Past Medical History  Diagnosis Date  . Cancer     kidney  . Acid reflux    Past Surgical History  Procedure Laterality Date  . Carotidendarterectomy    . Nephrectomy      left  . Abdominal hysterectomy      partial  . Inner ear surgery      right   No family history on file. History  Substance Use Topics  . Smoking status: Current Every Day Smoker -- 1.00 packs/day  . Smokeless tobacco: Not on file  . Alcohol Use: No   OB History   Grav Para Term Preterm Abortions TAB SAB Ect Mult Living                 Review of Systems  Musculoskeletal: Positive for arthralgias and joint swelling.  Neurological: Negative for numbness.  All other systems reviewed and are negative.   Allergies  Iohexol and Penicillins  Home Medications   Current Outpatient Rx  Name  Route  Sig  Dispense  Refill  . aspirin EC 81 MG tablet   Oral   Take 81 mg by mouth daily.         . Aspirin-Salicylamide-Caffeine (BC HEADACHE POWDER PO)    Oral   Take 1 packet by mouth 3 (three) times daily as needed. For pain/headaches.         Marland Kitchen omeprazole (PRILOSEC) 20 MG capsule   Oral   Take 20 mg by mouth daily.         Marland Kitchen oxyCODONE-acetaminophen (PERCOCET/ROXICET) 5-325 MG per tablet   Oral   Take 1 tablet by mouth every 8 (eight) hours as needed for severe pain.   7 tablet   0    Triage Vitals: BP 152/77  Pulse 90  Temp(Src) 97.9 F (36.6 C) (Oral)  Resp 20  SpO2 95%  Physical Exam  Nursing note and vitals reviewed. Constitutional: She is oriented to person, place, and time. She appears well-developed and well-nourished. No distress.  HENT:  Head: Normocephalic and atraumatic.  Neck: Normal range of motion. Neck supple.  Cardiovascular: Intact distal pulses.   Pulses:      Radial pulses are 2+ on the right side, and 2+ on the left side.       Dorsalis pedis pulses are 2+ on the right side, and 2+ on the left side.       Posterior tibial pulses are 2+ on the  right side, and 2+ on the left side.  Cap refill less than 3 seconds  Pulmonary/Chest: Effort normal.  Musculoskeletal: She exhibits tenderness.       Feet:  Swelling localized to the lateral aspect of the left ankle-lateral malleolus and distal fibula region. Negative deformities, erythema, inflammation, ecchymosis noted. Discomfort upon palpation to lateral malleolus and distal fibular region. Decreased ROM to the left ankle secondary to pain. Patient is able to wiggle the toes - flexion, extension, abduction, and adduction of toes noted to the left foot. Strength intact to the digits. Sensation intact with differentiation to sharp and dull touch. Full ROM to the left knee - negative deformities noted - negative pain upon palpation - stable left knee joint.   Neurological: She is alert and oriented to person, place, and time. She exhibits normal muscle tone. Coordination normal.  Cranial nerves III-XII grossly intact Strength 5+/5+ to lower extremities  bilaterally with resistance applied, equal distribution noted Strength intact to digits of the feet bilaterally Sensation intact with differentiation to sharp and dull touch  Skin: Skin is warm and dry. No rash noted. She is not diaphoretic. No erythema.  Psychiatric: She has a normal mood and affect. Her behavior is normal.    ED Course  Procedures DIAGNOSTIC STUDIES: Oxygen Saturation is 95% on room air, adequate by my interpretation.    COORDINATION OF CARE: At 7:16 PM: Discussed treatment plan with patient which includes placing the right foot in a boot. Patient agrees.   7:27 PM This provider spoke with Dr. Tonita Cong, orthopedics, regarding case, history, presentation-just wanted consult physician regarding this provider referring the patient to this physician. Recommended knee x-ray to be performed. Agreed to plan of camwalker boot and crutches.   Dg Ankle Complete Left  02/17/2013   CLINICAL DATA:  Fall, twisted ankle  EXAM: LEFT ANKLE COMPLETE - 3+ VIEW  COMPARISON:  None.  FINDINGS: Small avulsion fracture from the lateral aspect of the distal fibula with associated overlying soft tissue swelling. The ankle mortise remains congruent. The talar dome is intact. Normal bony mineralization. No acute malalignment.  IMPRESSION: Acute avulsion fracture from the lateral aspect of the distal fibula.   Electronically Signed   By: Jacqulynn Cadet M.D.   On: 02/17/2013 18:33   Dg Knee Complete 4 Views Left  02/17/2013   CLINICAL DATA:  Post fall, known ankle fracture  EXAM: LEFT KNEE - COMPLETE 4+ VIEW  COMPARISON:  None.  FINDINGS: No fracture or dislocation. Joint spaces are preserved though there is a minimal amount of chondrocalcinosis noted within the lateral compartment of the knee. No joint effusion. Regional soft tissues appear normal.  IMPRESSION: 1. No acute findings. 2. Chondrocalcinosis suggestive of CPPD. No significant associated degenerative change.   Electronically Signed   By: Sandi Mariscal M.D.   On: 02/17/2013 20:28    MDM   Final diagnoses:  Avulsion fracture of distal fibula    Filed Vitals:   02/17/13 1815  BP: 152/77  Pulse: 90  Temp: 97.9 F (36.6 C)  TempSrc: Oral  Resp: 20  SpO2: 95%   I personally performed the services described in this documentation, which was scribed in my presence. The recorded information has been reviewed and is accurate.  Patient presenting to the ED with left ankle injury that occurred prior to arrival to the emergency department. Patient reported that she stepped in a hole that resulted in her landing on her left ankle in an inverted manner. Reported the  pain 9/10 to her left ankle with radiation to the mid shin. Reported that her toes feel tight and swollen. Reported that she broke her left ankle at least 2 times once before. Alert oriented. GCS 15. Radial, DP, PT pulses 2+ bilaterally. Swelling localized to the lateral aspect of the left ankle and distal fibula region with negative ecchymosis or erythema noted. Discomfort upon palpation to distal fibula and left lateral malleolus. Full range of motion to the digits of the left foot. Cap refill less than 3 seconds. Strength intact with equal distribution to lower extremity. Full range of motion to left knee. Strength intact to digits of the left foot. Discomfort with motion of inversion and eversion of the left ankle as well as plantar flexion and dorsiflexion. Sensation intact with differentiation to sharp and dull touch. Plain film of left ankle noted acute avulsion fracture from the lateral aspect of the distal fibula - closed injury. Plain film of left knee negative for acute osseous injury.  This provider spoke with orthopedic surgeon-Dr. Arnoldo Hooker recommended left knee plain film. Discussed case, history and presentation with the physician who agreed to plan of care - cam walker, crutches and outpatient follow-up. Patient presenting to the ED with acute, closed avulsion fracture  to the lateral aspect of the distal fibula. Patient neurovascular intact. Patient placed in cam walker boot. Reported that she has crutches at home. Discharge patient with small dose of pain medications-discussed course, precautions, disposal technique. Referred patient to orthopedics and primary care provider. Discussed with patient to rest, ice, elevate. Recommended that patient be off her foot/ankle for the next week - patient reported that she cannot miss that much work - patient stands on feet for at least 10 hours straight, discussed with patient that she needs to rest. Discussed with patient to closely monitor symptoms and if symptoms are to worsen or change to report back to the ED - strict return instructions given.  Patient agreed to plan of care, understood, all questions answered.      Wanda Mead, PA-C 02/18/13 [phone]

## 2013-02-20 NOTE — ED Provider Notes (Signed)
Medical screening examination/treatment/procedure(s) were performed by non-physician practitioner and as supervising physician I was immediately available for consultation/collaboration.  EKG Interpretation   None         Houston Siren III, MD 02/20/13 1325

## 2013-06-18 ENCOUNTER — Emergency Department (HOSPITAL_COMMUNITY)
Admission: EM | Admit: 2013-06-18 | Discharge: 2013-06-18 | Disposition: A | Discharge status: Home / Self Care | Payer: BC Managed Care – PPO | Source: Home / Self Care | Attending: Emergency Medicine | Admitting: Emergency Medicine

## 2013-06-18 ENCOUNTER — Encounter (HOSPITAL_COMMUNITY): Payer: Self-pay | Admitting: Emergency Medicine

## 2013-06-18 DIAGNOSIS — H6590 Unspecified nonsuppurative otitis media, unspecified ear: Secondary | ICD-10-CM

## 2013-06-18 DIAGNOSIS — H7290 Unspecified perforation of tympanic membrane, unspecified ear: Principal | ICD-10-CM

## 2013-06-18 MED ORDER — FLUTICASONE PROPIONATE 50 MCG/ACT NA SUSP: 2.0000 | Freq: Two times a day (BID) | NASAL | Status: DC

## 2013-06-18 MED ORDER — PREDNISONE 10 MG PO TABS: ORAL_TABLET | ORAL | Status: DC

## 2013-06-18 NOTE — Discharge Instructions (Signed)
Serous Otitis Media  Serous otitis media is fluid in the middle ear space. This space contains the bones for hearing and air. Air in the middle ear space helps to transmit sound.  The air gets there through the eustachian tube. This tube goes from the back of the nose (nasopharynx) to the middle ear space. It keeps the pressure in the middle ear the same as the outside world. It also helps to drain fluid from the middle ear space. CAUSES  Serous otitis media occurs when the eustachian tube gets blocked. Blockage can come from:  Ear infections.  Colds and other upper respiratory infections.  Allergies.  Irritants such as cigarette smoke.  Sudden changes in air pressure (such as descending in an airplane).  Enlarged adenoids.  A mass in the nasopharynx. During colds and upper respiratory infections, the middle ear space can become temporarily filled with fluid. This can happen after an ear infection also. Once the infection clears, the fluid will generally drain out of the ear through the eustachian tube. If it does not, then serous otitis media occurs. SIGNS AND SYMPTOMS   Hearing loss.  A feeling of fullness in the ear, without pain.  Young children may not show any symptoms but may show slight behavioral changes, such as agitation, ear pulling, or crying. DIAGNOSIS  Serous otitis media is diagnosed by an ear exam. Tests may be done to check on the movement of the eardrum. Hearing exams may also be done. TREATMENT  The fluid most often goes away without treatment. If allergy is the cause, allergy treatment may be helpful. Fluid that persists for several months may require minor surgery. A small tube is placed in the eardrum to:  Drain the fluid.  Restore the air in the middle ear space. In certain situations, antibiotics are used to avoid surgery. Surgery may be done to remove enlarged adenoids (if this is the cause). HOME CARE INSTRUCTIONS   Keep children away from tobacco  smoke.  Be sure to keep any follow-up appointments. SEEK MEDICAL CARE IF:   Your hearing is not better in 3 months.  Your hearing is worse.  You have ear pain.  You have drainage from the ear.  You have dizziness.  You have serous otitis media only in one ear or have any bleeding from your nose (epistaxis).  You notice a lump on your neck. MAKE SURE YOU:  Understand these instructions.   Will watch your condition.   Will get help right away if you are not doing well or get worse.  Document Released: 03/13/2003 Document Revised: 08/23/2012 Document Reviewed: 07/18/2012 [employer] Patient Information 2014 [employer].  

## 2013-06-18 NOTE — ED Notes (Signed)
Patient c/o right ear pain onset last night. Patient reports she took out her hearing aid last night and it was covered in blood and that ear has continued bleeding. Has a headache. Was previously sick and had a bad cough. Felt like something popped in her ear when she coughed. Patient reports hx of ear surgery in right ear. Left ear feels full. Patient is alert and oriented and in no acute distress.

## 2013-06-18 NOTE — ED Provider Notes (Signed)
CSN: [id number]     Arrival date & time 06/18/13  1021 History   First MD Initiated Contact with Patient 06/18/13 1128     Chief Complaint  Patient presents with  . Otalgia   (Consider location/radiation/quality/duration/timing/severity/associated sxs/prior Treatment) HPI Comments: 58 year old female presents complaining of right ear pain and bleeding from the right ear. Last night when she took her here it out but was covered in blood and she continued to have some bleeding. She also has pain and pressure in the left ear. She never had any pain or pressure in the right ear. She has a history of surgery to repair the right tympanic membrane due to have years ago. She has also had a cough and cold symptoms for about one week. She denies fever, chills, NVD, shortness of breath. She does admit to a slight headache.  Patient is a 58 y.o. female presenting with ear pain.  Otalgia Associated symptoms: congestion, cough and ear discharge   Associated symptoms: no fever, no rhinorrhea and no sore throat     Past Medical History  Diagnosis Date  . Cancer     kidney  . Acid reflux    Past Surgical History  Procedure Laterality Date  . Carotidendarterectomy    . Nephrectomy      left  . Abdominal hysterectomy      partial  . Inner ear surgery      right   No family history on file. History  Substance Use Topics  . Smoking status: Current Every Day Smoker -- 1.00 packs/day  . Smokeless tobacco: Not on file  . Alcohol Use: No   OB History   Grav Para Term Preterm Abortions TAB SAB Ect Mult Living                 Review of Systems  Constitutional: Negative for fever and chills.  HENT: Positive for congestion, ear discharge, ear pain and sneezing. Negative for rhinorrhea and sore throat.   Respiratory: Positive for cough. Negative for chest tightness, shortness of breath and wheezing.   Cardiovascular: Negative for chest pain.  All other systems reviewed and are  negative.   Allergies  Iohexol and Penicillins  Home Medications   Prior to Admission medications   Medication Sig Start Date End Date Taking? Authorizing Provider  aspirin EC 81 MG tablet Take 81 mg by mouth daily.    Historical Provider, MD  Aspirin-Salicylamide-Caffeine (BC HEADACHE POWDER PO) Take 1 packet by mouth 3 (three) times daily as needed. For pain/headaches.    Historical Provider, MD  fluticasone (FLONASE) 50 MCG/ACT nasal spray Place 2 sprays into both nostrils 2 (two) times daily. Decrease to 2 sprays/nostril daily after 5 days 06/18/13   Liam Graham, PA-C  omeprazole (PRILOSEC) 20 MG capsule Take 20 mg by mouth daily.    Historical Provider, MD  oxyCODONE-acetaminophen (PERCOCET/ROXICET) 5-325 MG per tablet Take 1 tablet by mouth every 8 (eight) hours as needed for severe pain. 02/17/13   Marissa Sciacca, PA-C  predniSONE (DELTASONE) 10 MG tablet 4 tabs PO QD for 4 days; 3 tabs PO QD for 3 days; 2 tabs PO QD for 2 days; 1 tab PO QD for 1 day 06/18/13   Liam Graham, PA-C   BP 183/104  Pulse 74  Temp(Src) 98.2 F (36.8 C) (Oral)  Resp 16  SpO2 96% Physical Exam  Nursing note and vitals reviewed. Constitutional: She is oriented to person, place, and time. Vital signs are normal. She  appears well-developed and well-nourished. No distress.  HENT:  Head: Normocephalic and atraumatic.  Right Ear: There is drainage (blood, with tympanic membrane perforation). Tympanic membrane is perforated (At the 6:00 position).  Left Ear: Tympanic membrane is bulging. A middle ear effusion (serous) is present.  Ears:  Nose: Nose normal. Right sinus exhibits no maxillary sinus tenderness and no frontal sinus tenderness. Left sinus exhibits no maxillary sinus tenderness and no frontal sinus tenderness.  Mouth/Throat: Uvula is midline, oropharynx is clear and moist and mucous membranes are normal.  Eyes: Conjunctivae are normal.  Neck: Normal range of motion. Neck supple.   Cardiovascular: Normal rate, regular rhythm and normal heart sounds.   Pulmonary/Chest: Effort normal and breath sounds normal. No respiratory distress.  Neurological: She is alert and oriented to person, place, and time. She has normal strength. Coordination normal.  Skin: Skin is warm and dry. No rash noted. She is not diaphoretic.  Psychiatric: She has a normal mood and affect. Judgment normal.    ED Course  Procedures (including critical care time) Labs Review Labs Reviewed - No data to display  Imaging Review No results found.   MDM   1. Serous otitis media with rupture of tympanic membrane    Treat with prednisone and Flonase 2 to help the serous otitis media of the contralateral ear. She will keep the right ear dry. She'll followup with her otolaryngologist.     Meds ordered this encounter  Medications  . predniSONE (DELTASONE) 10 MG tablet    Sig: 4 tabs PO QD for 4 days; 3 tabs PO QD for 3 days; 2 tabs PO QD for 2 days; 1 tab PO QD for 1 day    Dispense:  30 tablet    Refill:  0    Order Specific Question:  Supervising Provider    Answer:  Jake Michaelis, DAVID C [id number]  . fluticasone (FLONASE) 50 MCG/ACT nasal spray    Sig: Place 2 sprays into both nostrils 2 (two) times daily. Decrease to 2 sprays/nostril daily after 5 days    Dispense:  16 g    Refill:  2    Order Specific Question:  Supervising Provider    Answer:  Jake Michaelis, DAVID C [[id number]]       Liam Graham, PA-C 06/18/13 1204

## 2013-06-20 NOTE — ED Provider Notes (Signed)
Medical screening examination/treatment/procedure(s) were performed by non-physician practitioner and as supervising physician I was immediately available for consultation/collaboration.  Philipp Deputy, M.D.  Harden Mo, MD 06/20/13 [phone]

## 2013-07-03 ENCOUNTER — Emergency Department (HOSPITAL_COMMUNITY)
Admission: EM | Admit: 2013-07-03 | Discharge: 2013-07-03 | Disposition: A | Discharge status: Home / Self Care | Payer: BC Managed Care – PPO | Attending: Emergency Medicine | Admitting: Emergency Medicine

## 2013-07-03 ENCOUNTER — Emergency Department (HOSPITAL_COMMUNITY): Payer: BC Managed Care – PPO

## 2013-07-03 ENCOUNTER — Encounter (HOSPITAL_COMMUNITY): Payer: Self-pay | Admitting: Emergency Medicine

## 2013-07-03 DIAGNOSIS — Z8673 Personal history of transient ischemic attack (TIA), and cerebral infarction without residual deficits: Secondary | ICD-10-CM | POA: Insufficient documentation

## 2013-07-03 DIAGNOSIS — Z85528 Personal history of other malignant neoplasm of kidney: Secondary | ICD-10-CM | POA: Insufficient documentation

## 2013-07-03 DIAGNOSIS — F17200 Nicotine dependence, unspecified, uncomplicated: Secondary | ICD-10-CM | POA: Insufficient documentation

## 2013-07-03 DIAGNOSIS — K859 Acute pancreatitis without necrosis or infection, unspecified: Secondary | ICD-10-CM | POA: Insufficient documentation

## 2013-07-03 DIAGNOSIS — K219 Gastro-esophageal reflux disease without esophagitis: Secondary | ICD-10-CM | POA: Insufficient documentation

## 2013-07-03 DIAGNOSIS — Z880 Allergy status to penicillin: Secondary | ICD-10-CM | POA: Insufficient documentation

## 2013-07-03 DIAGNOSIS — R0602 Shortness of breath: Secondary | ICD-10-CM | POA: Insufficient documentation

## 2013-07-03 DIAGNOSIS — Z8679 Personal history of other diseases of the circulatory system: Secondary | ICD-10-CM | POA: Insufficient documentation

## 2013-07-03 DIAGNOSIS — Z79899 Other long term (current) drug therapy: Secondary | ICD-10-CM | POA: Insufficient documentation

## 2013-07-03 DIAGNOSIS — Z9889 Other specified postprocedural states: Secondary | ICD-10-CM | POA: Insufficient documentation

## 2013-07-03 DIAGNOSIS — R1013 Epigastric pain: Secondary | ICD-10-CM

## 2013-07-03 HISTORY — DX: Cerebral infarction, unspecified: I63.9

## 2013-07-03 LAB — CBC WITH DIFFERENTIAL/PLATELET
BASOS PCT: 1 % (ref 0–1)
Basophils Absolute: 0.1 10*3/uL (ref 0.0–0.1)
Eosinophils Absolute: 0.5 10*3/uL (ref 0.0–0.7)
Eosinophils Relative: 4 % (ref 0–5)
HCT: 47 % — ABNORMAL HIGH (ref 36.0–46.0)
Hemoglobin: 15.4 g/dL — ABNORMAL HIGH (ref 12.0–15.0)
Lymphocytes Relative: 16 % (ref 12–46)
Lymphs Abs: 2.1 10*3/uL (ref 0.7–4.0)
MCH: 29.8 pg (ref 26.0–34.0)
MCHC: 32.8 g/dL (ref 30.0–36.0)
MCV: 90.9 fL (ref 78.0–100.0)
MONOS PCT: 6 % (ref 3–12)
Monocytes Absolute: 0.8 10*3/uL (ref 0.1–1.0)
NEUTROS ABS: 9.7 10*3/uL — AB (ref 1.7–7.7)
Neutrophils Relative %: 73 % (ref 43–77)
Platelets: 274 10*3/uL (ref 150–400)
RBC: 5.17 MIL/uL — ABNORMAL HIGH (ref 3.87–5.11)
RDW: 15.6 % — AB (ref 11.5–15.5)
WBC: 13.2 10*3/uL — AB (ref 4.0–10.5)

## 2013-07-03 LAB — BASIC METABOLIC PANEL
BUN: 18 mg/dL (ref 6–23)
CO2: 28 mEq/L (ref 19–32)
Calcium: 9.1 mg/dL (ref 8.4–10.5)
Chloride: 102 mEq/L (ref 96–112)
Creatinine, Ser: 0.72 mg/dL (ref 0.50–1.10)
GFR calc non Af Amer: >90 mL/min (ref >90)
Glucose, Bld: 80 mg/dL (ref 70–99)
Potassium: 4.1 mEq/L (ref 3.7–5.3)
Sodium: 144 mEq/L (ref 137–147)

## 2013-07-03 LAB — HEPATIC FUNCTION PANEL
ALBUMIN: 3.5 g/dL (ref 3.5–5.2)
ALT: 18 U/L (ref 0–35)
AST: 21 U/L (ref 0–37)
Alkaline Phosphatase: 114 U/L (ref 39–117)
Bilirubin, Direct: <0.2 mg/dL (ref 0.0–0.3)
Total Bilirubin: <0.2 mg/dL — ABNORMAL LOW (ref 0.3–1.2)
Total Protein: 6.9 g/dL (ref 6.0–8.3)

## 2013-07-03 LAB — I-STAT TROPONIN, ED
TROPONIN I, POC: 0 ng/mL (ref 0.00–0.08)
Troponin i, poc: 0 ng/mL (ref 0.00–0.08)

## 2013-07-03 LAB — LIPASE, BLOOD: Lipase: 217 U/L — ABNORMAL HIGH (ref 11–59)

## 2013-07-03 MED ORDER — ONDANSETRON HCL 4 MG/2ML IJ SOLN
4.0000 mg | Freq: Once | INTRAMUSCULAR | Status: AC
  Administered 2013-07-03: 4 mg via INTRAVENOUS
  Filled 2013-07-03: qty 2

## 2013-07-03 MED ORDER — MORPHINE SULFATE 4 MG/ML IJ SOLN
4.0000 mg | Freq: Once | INTRAMUSCULAR | Status: AC
  Administered 2013-07-03: 4 mg via INTRAVENOUS
  Filled 2013-07-03: qty 1

## 2013-07-03 MED ORDER — TECHNETIUM TO 99M ALBUMIN AGGREGATED
6.0000 | Freq: Once | INTRAVENOUS | Status: AC | PRN
  Administered 2013-07-03: 6 via INTRAVENOUS

## 2013-07-03 MED ORDER — ONDANSETRON HCL 4 MG PO TABS: 4.0000 mg | ORAL_TABLET | Freq: Four times a day (QID) | ORAL | Status: DC

## 2013-07-03 MED ORDER — OXYCODONE-ACETAMINOPHEN 5-325 MG PO TABS: 1.0000 | ORAL_TABLET | Freq: Four times a day (QID) | ORAL | Status: DC | PRN

## 2013-07-03 MED ORDER — ASPIRIN 81 MG PO CHEW
324.0000 mg | CHEWABLE_TABLET | Freq: Once | ORAL | Status: AC
  Administered 2013-07-03: 324 mg via ORAL
  Filled 2013-07-03: qty 4

## 2013-07-03 MED ORDER — NITROGLYCERIN 0.4 MG SL SUBL
0.4000 mg | SUBLINGUAL_TABLET | SUBLINGUAL | Status: DC | PRN
  Administered 2013-07-03 (×2): 0.4 mg via SUBLINGUAL
  Filled 2013-07-03: qty 1

## 2013-07-03 MED ORDER — TECHNETIUM TC 99M DIETHYLENETRIAME-PENTAACETIC ACID: 40.0000 | Freq: Once | INTRAVENOUS | Status: DC | PRN

## 2013-07-03 NOTE — ED Provider Notes (Signed)
CSN: [id number]     Arrival date & time 07/03/13  0757 History   First MD Initiated Contact with Patient 07/03/13 [phone]     Chief Complaint  Patient presents with  . Chest Pain  . Shortness of Breath     (Consider location/radiation/quality/duration/timing/severity/associated sxs/prior Treatment) HPI Pt presenting with c/o midsternal chest pain.  Also some epigastric pain.  Pain is sharp and constant.  Symptms began while doing paperwork at her desk approx 10am.  Some shortness of breath due to discomfort.  She has had some diaphoresis. No fever/cough.  No leg swelling.  She has had no similar pain in the past.  No hx of chest pain in the past.  Has hx of renal cancer.  No hx of trauma/DVT/PE, no recent travel or surgery.  There are no other associated systemic symptoms, there are no other alleviating or modifying factors.   Past Medical History  Diagnosis Date  . Cancer     kidney  . Acid reflux   . Stroke    Past Surgical History  Procedure Laterality Date  . Carotidendarterectomy    . Nephrectomy      left  . Abdominal hysterectomy      partial  . Inner ear surgery      right   No family history on file. History  Substance Use Topics  . Smoking status: Current Every Day Smoker -- 1.00 packs/day    Types: Cigarettes  . Smokeless tobacco: Not on file  . Alcohol Use: No   OB History   Grav Para Term Preterm Abortions TAB SAB Ect Mult Living                 Review of Systems ROS reviewed and all otherwise negative except for mentioned in HPI    Allergies  Iohexol and Penicillins  Home Medications   Prior to Admission medications   Medication Sig Start Date End Date Taking? Authorizing Provider  Aspirin-Salicylamide-Caffeine (BC HEADACHE POWDER PO) Take 1 packet by mouth 3 (three) times daily as needed. For pain/headaches.   Yes Historical Provider, MD  fluticasone (FLONASE) 50 MCG/ACT nasal spray Place 1 spray into both nostrils daily as needed for allergies or  rhinitis.   Yes Historical Provider, MD  ibuprofen (ADVIL) 200 MG tablet Take 200 mg by mouth daily.   Yes Historical Provider, MD  loratadine (CLARITIN) 10 MG tablet Take 10 mg by mouth daily.   Yes Historical Provider, MD  omeprazole (PRILOSEC) 20 MG capsule Take 20 mg by mouth daily.   Yes Historical Provider, MD  aspirin EC 81 MG tablet Take 81 mg by mouth daily.    Historical Provider, MD   BP 109/69  Pulse 92  Temp(Src) 97.4 F (36.3 C) (Oral)  Resp 22  Ht 5\' 2"  (1.575 m)  Wt 137 lb (62.143 kg)  BMI 25.05 kg/m2  SpO2 94% Vitals reivewed Physical Exam Physical Examination: General appearance - alert, well appearing, and in no distress Mental status - alert, oriented to person, place, and time Eyes - no conjunctival injection, no scleral icterus Mouth - mucous membranes moist, pharynx normal without lesions Chest - clear to auscultation, no wheezes, rales or rhonchi, symmetric air entry Heart - normal rate, regular rhythm, normal S1, S2, no murmurs, rubs, clicks or gallops Abdomen - soft, epigastric tenderness to palpation, no gaurding or rebound, nondistended, no masses or organomegaly Extremities - peripheral pulses normal, no pedal edema, no clubbing or cyanosis Skin - normal coloration and turgor,  no rashes  ED Course  Procedures (including critical care time) Labs Review Labs Reviewed  CBC WITH DIFFERENTIAL - Abnormal; Notable for the following:    WBC 13.2 (*)    RBC 5.17 (*)    Hemoglobin 15.4 (*)    HCT 47.0 (*)    RDW 15.6 (*)    Neutro Abs 9.7 (*)    All other components within normal limits  HEPATIC FUNCTION PANEL - Abnormal; Notable for the following:    Total Bilirubin <0.2 (*)    All other components within normal limits  LIPASE, BLOOD - Abnormal; Notable for the following:    Lipase 217 (*)    All other components within normal limits  BASIC METABOLIC PANEL  I-STAT TROPOININ, ED  I-STAT TROPOININ, ED    Imaging Review Ct Abdomen Pelvis Wo  Contrast  07/03/2013   CLINICAL DATA:  Pain below the left breast ; history previous left nephrectomy, COPD, and pulmonary fibrosis.  EXAM: CT ABDOMEN AND PELVIS WITHOUT CONTRAST  TECHNIQUE: Multidetector CT imaging of the abdomen and pelvis was performed following the standard protocol without IV contrast.  COMPARISON:  None.  FINDINGS: The patient has undergone previous left nephrectomy for malignancy. The left renal fossa is occupied by normal appearing small bowel. There is mild hypertrophy of the right kidney. There is no focal mass nor evidence of obstruction or calcified stones. The right adrenal gland is normal. The left adrenal gland may be surgically absent.  The liver, gallbladder, pancreas, spleen, nondistended stomach, and abdominal aorta are normal in appearance. There is mural calcification in the infrarenal abdominal aorta.  The orally administered contrast has traversed much of the small bowel and reached the colon. There is no small or large bowel obstruction nor evidence of enteritis or colitis. A normal appendix is demonstrated. There is sigmoid diverticulosis without evidence of acute diverticulitis.  The urinary bladder is normal. The uterus is surgically absent. There are no adnexal masses nor is there free pelvic fluid. There is no inguinal nor umbilical hernia. The subcutaneous fat of the abdominal and pelvic walls is normal.  The lumbar spine exhibits degenerative disc change at L1-2, L2-3, and L3-4. The bony pelvis is unremarkable. The left lung base exhibits minimal subsegmental atelectasis or scarring. More significant scarring with tiny blebs are demonstrated at the right lung base posteriorly.  IMPRESSION: 1. There it are no findings to suggest recurrent renal cell malignancy 2. There is no acute hepatobiliary abnormality nor acute bowel abnormality. There is sigmoid diverticulosis without evidence of acute diverticulitis. 3. Within the upper abdomen there are no findings to explain  the patient's current symptoms. 4. There are fibrotic changes at both lung bases.   Electronically Signed   By: David  Martinique   On: 07/03/2013 14:06   Dg Chest 2 View  07/03/2013   CLINICAL DATA:  Chest pain, cough/congestion  EXAM: CHEST  2 VIEW  COMPARISON:  03/30/2011  FINDINGS: Chronic interstitial markings. No focal consolidation. No pleural effusion or pneumothorax.  The heart is normal in size.  Visualized osseous structures are within normal limits.  Cholecystectomy clips.  IMPRESSION: No evidence of acute cardiopulmonary disease.   Electronically Signed   By: Julian Hy M.D.   On: 07/03/2013 09:50   Nm Pulmonary Perf And Vent  07/03/2013   CLINICAL DATA:  chest pain, hx of renal carcinoma, IVP anaphylaxis  EXAM: NUCLEAR MEDICINE VENTILATION - PERFUSION LUNG SCAN  TECHNIQUE: Ventilation images were obtained in multiple projections using inhaled aerosol  technetium 26 M DTPA. Perfusion images were obtained in multiple projections after intravenous injection of Tc-53m MAA.  RADIOPHARMACEUTICALS:  40 mCi Tc-46m DTPA aerosol and 6 mCi Tc-33m MAA  COMPARISON:  Two view chest dated 07/03/2013, nuclear medicine V/Q scan 10/17/2007  FINDINGS: Ventilation: Multiple areas of air trapping within the right and left lungs. There is a central radiotracer aggregation.  Perfusion: Matched perfusion deficits within the right and left lungs in the regions of air trapping. When compared to the previous study these areas of matched deficits are stable.  IMPRESSION: Matched deficits within the nuclear medicine ventilation perfusion evaluation. When compared to the previous study these findings are stable. This consistent with low probability for acute pulmonary embolus.   Electronically Signed   By: Margaree Mackintosh M.D.   On: 07/03/2013 12:22     EKG Interpretation   Date/Time:  Tuesday July 03 2013 07:59:31 EDT Ventricular Rate:  85 PR Interval:  122 QRS Duration: 74 QT Interval:  336 QTC Calculation:  399 R Axis:   68 Text Interpretation:  Normal sinus rhythm Septal infarct , age  undetermined Abnormal ECG No significant change since last tracing  Confirmed by [facility]  MD, MARTHA [id number]) on 07/03/2013 3:01:36 PM      MDM   Final diagnoses:  Epigastric pain   Pt presenting with c/o epigastric and chest pain.  Pt has elevated lipase, EKg reassuring as well as 2 sets of troponins negative despite continued pain for 6 hours.  Given this information, low suspicion for ACS.  Suspect more likely pancreatitis would explain patient's symptoms.  CT scan does not show any acute abnormalities.  Discharged with strict return precautions.  Pt agreeable with plan.    Threasa Beards, MD 07/06/13 [phone]

## 2013-07-03 NOTE — ED Notes (Signed)
CT notified pt has finished contrast. 

## 2013-07-03 NOTE — ED Notes (Signed)
Pt states that breathing makes chest pain worse, holding under left breast area. Dr. Canary Brim at bedside.

## 2013-07-03 NOTE — ED Notes (Signed)
Pain still 10/10--

## 2013-07-03 NOTE — ED Notes (Signed)
Santiago Glad notifed of patient.  Nurse at the bedside.

## 2013-07-03 NOTE — Discharge Instructions (Signed)
Return to the ED with any concerns including vomiting and not able to keep down liquids, worsening pain, difficulty breathing, decreased level of alertness/lethargy, or any other alarming symptoms

## 2013-07-03 NOTE — ED Notes (Signed)
Pt presents to department for evaluation of midsternal non radiating chest pain. Onset this morning. 9/10 pain upon arrival. Also states SOB. Chest pain increases with deep breathing. Pt is alert and oriented x4. Skin warm and dry. Respirations unlabored.

## 2013-07-05 ENCOUNTER — Telehealth: Payer: Self-pay | Admitting: Emergency Medicine

## 2013-07-05 NOTE — Telephone Encounter (Signed)
Daughter called in to schedule HFU appointment pt  S/p Pancreatitis diagnosed in ER 07/03/13 Pt given 1st available appt with Dr. Annitta Needs 07/30/13 @ 1030 am

## 2013-07-30 ENCOUNTER — Ambulatory Visit: Payer: BC Managed Care – PPO | Admitting: Internal Medicine

## 2013-10-19 ENCOUNTER — Other Ambulatory Visit: Payer: Self-pay

## 2014-07-25 ENCOUNTER — Emergency Department (HOSPITAL_COMMUNITY)
Admission: EM | Admit: 2014-07-25 | Discharge: 2014-07-25 | Discharge status: Against Medical Advice | Payer: BLUE CROSS/BLUE SHIELD | Attending: Emergency Medicine | Admitting: Emergency Medicine

## 2014-07-25 ENCOUNTER — Encounter (HOSPITAL_COMMUNITY): Payer: Self-pay | Admitting: Emergency Medicine

## 2014-07-25 DIAGNOSIS — K5900 Constipation, unspecified: Secondary | ICD-10-CM | POA: Diagnosis not present

## 2014-07-25 DIAGNOSIS — Z720 Tobacco use: Secondary | ICD-10-CM | POA: Insufficient documentation

## 2014-07-25 DIAGNOSIS — R109 Unspecified abdominal pain: Secondary | ICD-10-CM

## 2014-07-25 HISTORY — DX: Unspecified cataract: H26.9

## 2014-07-25 NOTE — ED Notes (Signed)
Pt walked out with visitor behind PA-C Tapia. PA-C reports patient is leaving. Pt did not sign AMA. Steady gait upon leaving. In NAD.

## 2014-07-25 NOTE — ED Notes (Addendum)
Pt states that for about last month she has been constipated and having to take laxatives.  Pt c/o abd cramping and lower back spasms. pts family member states that if pt is any temperature over 96 then she has a fever.

## 2016-08-06 ENCOUNTER — Other Ambulatory Visit: Payer: Self-pay | Admitting: Orthopedic Surgery

## 2016-08-14 NOTE — Pre-Procedure Instructions (Signed)
Wanda James  08/14/2016      [facility] [place] [address] Phone: [phone] Fax: [phone]    Your procedure is scheduled on August 19, 2016.  Report to [facility] Admitting at 9:00 A.M.  Call this number if you have problems the morning of surgery:  [phone]  Call [phone] if you have any questions prior to your surgery date Monday-Friday 8am-4pm   Remember:  Do not eat food or drink liquids after midnight.  Take these medicines the morning of surgery with A SIP OF WATER Tramadol (Ultram) if needed.  7 days prior to surgery STOP taking any Aspirin, Aleve, Naproxen, Ibuprofen, Motrin, Advil, Goody's, BC's, all herbal medications, fish oil, and all vitamins.   Do not wear jewelry, make-up or nail polish.  Do not wear lotions, powders, or perfumes, or deoderant.  Do not shave 48 hours prior to surgery.    Do not bring valuables to the hospital.   [facility] is not responsible for any belongings or valuables.  Contacts, dentures or bridgework may not be worn into surgery.  Leave your suitcase in the car.  After surgery it may be brought to your room.  For patients admitted to the hospital, discharge time will be determined by your treatment team.  Patients discharged the day of surgery will not be allowed to drive home.    Special instructions:   [place]- Preparing For Surgery  Before surgery, you can play an important role. Because skin is not sterile, your skin needs to be as free of germs as possible. You can reduce the number of germs on your skin by washing with CHG (chlorahexidine gluconate) Soap before surgery.  CHG is an antiseptic cleaner which kills germs and bonds with the skin to continue killing germs even after washing.  Please do not use if you have an allergy to CHG or antibacterial soaps. If your skin  becomes reddened/irritated stop using the CHG.  Do not shave (including legs and underarms) for at least 48 hours prior to first CHG shower. It is OK to shave your face.  Please follow these instructions carefully.   1. Shower the NIGHT BEFORE SURGERY and the MORNING OF SURGERY with CHG.   2. If you chose to wash your hair, wash your hair first as usual with your normal shampoo.  3. After you shampoo, rinse your hair and body thoroughly to remove the shampoo.  4. Use CHG as you would any other liquid soap. You can apply CHG directly to the skin and wash gently with a scrungie or a clean washcloth.   5. Apply the CHG Soap to your body ONLY FROM THE NECK DOWN.  Do not use on open wounds or open sores. Avoid contact with your eyes, ears, mouth and genitals (private parts). Wash genitals (private parts) with your normal soap.  6. Wash thoroughly, paying special attention to the area where your surgery will be performed.  7. Thoroughly rinse your body with warm water from the neck down.  8. DO NOT shower/wash with your normal soap after using and rinsing off the CHG Soap.  9. Pat yourself dry with a CLEAN TOWEL.   10. Wear CLEAN PAJAMAS   11. Place CLEAN SHEETS on your bed the night of your first shower and DO NOT SLEEP WITH PETS.  Day of Surgery: Do not apply any deodorants/lotions. Please wear clean clothes to the hospital/surgery center.      Please read over the following fact sheets that you were given. Pain Booklet, Coughing and Deep Breathing, MRSA Information and Surgical Site Infection Prevention

## 2016-08-16 ENCOUNTER — Encounter (HOSPITAL_COMMUNITY): Payer: Self-pay

## 2016-08-16 ENCOUNTER — Encounter (HOSPITAL_COMMUNITY)
Admission: RE | Admit: 2016-08-16 | Discharge: 2016-08-16 | Disposition: A | Discharge status: Home / Self Care | Payer: BLUE CROSS/BLUE SHIELD | Source: Ambulatory Visit | Attending: Orthopedic Surgery | Admitting: Orthopedic Surgery

## 2016-08-16 DIAGNOSIS — Z419 Encounter for procedure for purposes other than remedying health state, unspecified: Secondary | ICD-10-CM

## 2016-08-16 DIAGNOSIS — M48061 Spinal stenosis, lumbar region without neurogenic claudication: Secondary | ICD-10-CM | POA: Diagnosis not present

## 2016-08-16 DIAGNOSIS — F17210 Nicotine dependence, cigarettes, uncomplicated: Secondary | ICD-10-CM | POA: Diagnosis not present

## 2016-08-16 DIAGNOSIS — K219 Gastro-esophageal reflux disease without esophagitis: Secondary | ICD-10-CM | POA: Diagnosis not present

## 2016-08-16 DIAGNOSIS — J449 Chronic obstructive pulmonary disease, unspecified: Secondary | ICD-10-CM | POA: Diagnosis not present

## 2016-08-16 DIAGNOSIS — Z7982 Long term (current) use of aspirin: Secondary | ICD-10-CM | POA: Diagnosis not present

## 2016-08-16 DIAGNOSIS — Z880 Allergy status to penicillin: Secondary | ICD-10-CM | POA: Diagnosis not present

## 2016-08-16 DIAGNOSIS — M5416 Radiculopathy, lumbar region: Secondary | ICD-10-CM | POA: Diagnosis not present

## 2016-08-16 DIAGNOSIS — M1990 Unspecified osteoarthritis, unspecified site: Secondary | ICD-10-CM | POA: Diagnosis not present

## 2016-08-16 DIAGNOSIS — Z79899 Other long term (current) drug therapy: Secondary | ICD-10-CM | POA: Diagnosis not present

## 2016-08-16 DIAGNOSIS — G935 Compression of brain: Secondary | ICD-10-CM | POA: Diagnosis not present

## 2016-08-16 DIAGNOSIS — Z8673 Personal history of transient ischemic attack (TIA), and cerebral infarction without residual deficits: Secondary | ICD-10-CM | POA: Diagnosis not present

## 2016-08-16 HISTORY — DX: Compression of brain: G93.5

## 2016-08-16 HISTORY — DX: Unspecified osteoarthritis, unspecified site: M19.90

## 2016-08-16 HISTORY — DX: Other complications of anesthesia, initial encounter: T88.59XA

## 2016-08-16 HISTORY — DX: Adverse effect of unspecified anesthetic, initial encounter: T41.45XA

## 2016-08-16 HISTORY — DX: Diverticulitis of intestine, part unspecified, without perforation or abscess without bleeding: K57.92

## 2016-08-16 HISTORY — DX: Headache, unspecified: R51.9

## 2016-08-16 HISTORY — DX: Pneumonia, unspecified organism: J18.9

## 2016-08-16 HISTORY — DX: Headache: R51

## 2016-08-16 HISTORY — DX: Stricture of artery: I77.1

## 2016-08-16 HISTORY — DX: Chronic obstructive pulmonary disease, unspecified: J44.9

## 2016-08-16 LAB — CBC WITH DIFFERENTIAL/PLATELET
Basophils Absolute: 0.1 10*3/uL (ref 0.0–0.1)
Basophils Relative: 1 %
EOS ABS: 0.5 10*3/uL (ref 0.0–0.7)
EOS PCT: 6 %
HCT: 45.6 % (ref 36.0–46.0)
HEMOGLOBIN: 15.2 g/dL — AB (ref 12.0–15.0)
LYMPHS ABS: 2.1 10*3/uL (ref 0.7–4.0)
LYMPHS PCT: 22 %
MCH: 30.1 pg (ref 26.0–34.0)
MCHC: 33.3 g/dL (ref 30.0–36.0)
MCV: 90.3 fL (ref 78.0–100.0)
MONOS PCT: 4 %
Monocytes Absolute: 0.4 10*3/uL (ref 0.1–1.0)
NEUTROS PCT: 67 %
Neutro Abs: 6.6 10*3/uL (ref 1.7–7.7)
Platelets: 288 10*3/uL (ref 150–400)
RBC: 5.05 MIL/uL (ref 3.87–5.11)
RDW: 15.3 % (ref 11.5–15.5)
WBC: 9.8 10*3/uL (ref 4.0–10.5)

## 2016-08-16 LAB — URINALYSIS, ROUTINE W REFLEX MICROSCOPIC
Bilirubin Urine: NEGATIVE
Glucose, UA: NEGATIVE mg/dL
KETONES UR: NEGATIVE mg/dL
Nitrite: NEGATIVE
PH: 5 (ref 5.0–8.0)
Protein, ur: 100 mg/dL — AB
SPECIFIC GRAVITY, URINE: 1.021 (ref 1.005–1.030)

## 2016-08-16 LAB — COMPREHENSIVE METABOLIC PANEL
ALT: 19 U/L (ref 14–54)
ANION GAP: 8 (ref 5–15)
AST: 24 U/L (ref 15–41)
Albumin: 3.7 g/dL (ref 3.5–5.0)
Alkaline Phosphatase: 84 U/L (ref 38–126)
BUN: 24 mg/dL — ABNORMAL HIGH (ref 6–20)
CALCIUM: 9.2 mg/dL (ref 8.9–10.3)
CO2: 30 mmol/L (ref 22–32)
Chloride: 101 mmol/L (ref 101–111)
Creatinine, Ser: 0.95 mg/dL (ref 0.44–1.00)
Glucose, Bld: 95 mg/dL (ref 65–99)
Potassium: 4.3 mmol/L (ref 3.5–5.1)
Sodium: 139 mmol/L (ref 135–145)
TOTAL PROTEIN: 6.9 g/dL (ref 6.5–8.1)
Total Bilirubin: 0.2 mg/dL — ABNORMAL LOW (ref 0.3–1.2)

## 2016-08-16 LAB — SURGICAL PCR SCREEN
MRSA, PCR: NEGATIVE
STAPHYLOCOCCUS AUREUS: NEGATIVE

## 2016-08-16 LAB — PROTIME-INR
INR: 1.01
PROTHROMBIN TIME: 13.3 s (ref 11.4–15.2)

## 2016-08-16 LAB — APTT: aPTT: 35 seconds (ref 24–36)

## 2016-08-16 NOTE — Progress Notes (Signed)
Wanda James            08/14/2016                          [facility] [place], [address] [place] [address] Phone: [phone] Fax: [phone]              Your procedure is scheduled on August 19, 2016.            Report to [facility] Admitting  Entrance "A" at 10:00 A.M.            Call this number if you have problems the morning of surgery:            [phone]  Call [phone] if you have any questions prior to your surgery date Monday-Friday 8am-4pm             Remember:            Do not eat food or drink liquids after midnight.            Take these medicines the morning of surgery with A SIP OF WATER Tramadol (Ultram) if needed for pain.  7 days prior to surgery STOP taking any Aspirin, Aleve, Naproxen, Ibuprofen, Motrin, Advil, Goody's, BC's, all herbal medications, fish oil, and all vitamins.             Do not wear jewelry, make-up or nail polish.            Do not wear lotions, powders, or perfumes, or deoderant.            Do not shave 48 hours prior to surgery.              Do not bring valuables to the hospital.             [facility] is not responsible for any belongings or valuables.  Contacts, dentures or bridgework may not be worn into surgery.  Leave your suitcase in the car.  After surgery it may be brought to your room.  For patients admitted to the hospital, discharge time will be determined by your treatment team.  Patients discharged the day of surgery will not be allowed to drive home.    Special instructions:   [place]- Preparing For Surgery  Before surgery, you can play an important role. Because skin is not sterile, your skin needs to be as free of germs as possible. You can reduce the number of germs on your skin by washing with CHG (chlorahexidine gluconate) Soap before surgery.  CHG is an antiseptic cleaner  which kills germs and bonds with the skin to continue killing germs even after washing.  Please do not use if you have an allergy to CHG or antibacterial soaps. If your skin becomes reddened/irritated stop using the CHG.  Do not shave (including legs and underarms) for at least 48 hours prior to first CHG shower. It is OK to shave your face.  Please follow these instructions carefully.  1. Shower the NIGHT BEFORE SURGERY and the MORNING OF SURGERY with CHG.   2. If you chose to wash your hair, wash your hair first as usual with your normal shampoo.  3. After you shampoo, rinse your hair and body thoroughly to remove the shampoo.  4. Use CHG as you would any other liquid soap. You can apply CHG directly to the skin and wash gently with a scrungie or a clean washcloth.   5. Apply the CHG Soap to your body ONLY FROM THE NECK DOWN.  Do not use on open wounds or open sores. Avoid contact with your eyes, ears, mouth and genitals (private parts). Wash genitals (private parts) with your normal soap.  6. Wash thoroughly, paying special attention to the area where your surgery will be performed.  7. Thoroughly rinse your body with warm water from the neck down.  8. DO NOT shower/wash with your normal soap after using and rinsing off the CHG Soap.  9. Pat yourself dry with a CLEAN TOWEL.   10. Wear CLEAN PAJAMAS   11. Place CLEAN SHEETS on your bed the night of your first shower and DO NOT SLEEP WITH PETS.  Day of Surgery: Do not apply any deodorants/lotions. Please wear clean clothes to the hospital/surgery center.    Please read over the following fact sheets that you were given. Pain Booklet, Coughing and Deep Breathing, MRSA Information and Surgical Site Infection Prevention

## 2016-08-16 NOTE — Progress Notes (Signed)
PCP - [place]  Cardiologist - Denies  Chest x-ray - Denies EKG - DOS 08/19/16- Due to (CE) hx of CAD, but pt denies  Stress Test -?2010  ECHO -?2010  Cardiac Cath - Denies  Sleep Study - Denies CPAP - None  Chart will be given to anesthesia for review due to requested records. Pt's sts she had an Echo and Stress test done before her carotid surgery by Dr. Estanislado Pandy in 2010.  Pt denies having chest pain, sob, or fever at this time. All instructions explained to the pt, with a verbal understanding of the material. Pt has stopped taking Aspirin, as directed by her surgeon's office. Pt agrees to go over the instructions while at home for a better understanding. The opportunity to ask questions was provided.

## 2016-08-17 ENCOUNTER — Encounter (HOSPITAL_COMMUNITY): Payer: Self-pay

## 2016-08-17 NOTE — Progress Notes (Signed)
Anesthesia Chart Review: Patient is 61 year old female scheduled for L4-5 decompression on 08/19/16 by Dr. Lynann Bologna.  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 She reports she is hard to awaken from anesthesia.   - PCP is Gaye Alken, PA-C at [facility]. - She is not followed by cardiologist routinely, but was seen by Dr. Minus Breeding in 2011 for precordial pain and had a normal nuclear study.   Meds include ASA 81 mg, BC Headache Powder, melatonin, tramadol.   BP (!) 158/81   Pulse 76   Temp 36.6 C   Resp 18   Ht 5\' 2"  (1.575 m)   Wt 118 lb 3.2 oz (53.6 kg)   SpO2 97%   BMI 21.62 kg/m   Patient with history subclavian artery stenosis, possible CVA, smoking, so I think she needs an EKG if not done within the past year. EKG was not done at [facility], so she will need one on the day of surgery. According to Care Everywhere, EKG on 02/26/15 showed NSR.  Nuclear stress test 02/11/09: Overall Impression: Normal stress nuclear study. Poor exercise tolerance. EF 67%.   Preoperative labs noted. Cr 0.95. H/H 15.2/45.6. PT/PTT WNL. Glucose 95.   If EKG stable and otherwise no acute changes then I anticipate that she can proceed as planned.  George Hugh [place] [facility] Short Stay Center/Anesthesiology Phone [phone] 08/17/2016 11:49 AM

## 2016-08-18 MED ORDER — CLINDAMYCIN PHOSPHATE 900 MG/50ML IV SOLN
900.0000 mg | INTRAVENOUS | Status: AC
  Administered 2016-08-19: 900 mg via INTRAVENOUS
  Filled 2016-08-18: qty 50

## 2016-08-19 ENCOUNTER — Ambulatory Visit (HOSPITAL_COMMUNITY): Payer: BLUE CROSS/BLUE SHIELD | Admitting: Certified Registered"

## 2016-08-19 ENCOUNTER — Ambulatory Visit (HOSPITAL_COMMUNITY): Payer: BLUE CROSS/BLUE SHIELD

## 2016-08-19 ENCOUNTER — Ambulatory Visit (HOSPITAL_COMMUNITY): Payer: BLUE CROSS/BLUE SHIELD | Admitting: Vascular Surgery

## 2016-08-19 ENCOUNTER — Encounter (HOSPITAL_COMMUNITY)
Admission: RE | Disposition: A | Discharge status: Home / Self Care | Payer: Self-pay | Source: Ambulatory Visit | Attending: Orthopedic Surgery

## 2016-08-19 ENCOUNTER — Encounter (HOSPITAL_COMMUNITY): Payer: Self-pay | Admitting: *Deleted

## 2016-08-19 ENCOUNTER — Ambulatory Visit (HOSPITAL_COMMUNITY)
Admission: RE | Admit: 2016-08-19 | Discharge: 2016-08-19 | Disposition: A | Discharge status: Home / Self Care | Payer: BLUE CROSS/BLUE SHIELD | Source: Ambulatory Visit | Attending: Orthopedic Surgery | Admitting: Orthopedic Surgery

## 2016-08-19 DIAGNOSIS — M48061 Spinal stenosis, lumbar region without neurogenic claudication: Secondary | ICD-10-CM | POA: Insufficient documentation

## 2016-08-19 DIAGNOSIS — Z79899 Other long term (current) drug therapy: Secondary | ICD-10-CM | POA: Insufficient documentation

## 2016-08-19 DIAGNOSIS — M5416 Radiculopathy, lumbar region: Secondary | ICD-10-CM | POA: Insufficient documentation

## 2016-08-19 DIAGNOSIS — G935 Compression of brain: Secondary | ICD-10-CM | POA: Insufficient documentation

## 2016-08-19 DIAGNOSIS — M1990 Unspecified osteoarthritis, unspecified site: Secondary | ICD-10-CM | POA: Insufficient documentation

## 2016-08-19 DIAGNOSIS — J449 Chronic obstructive pulmonary disease, unspecified: Secondary | ICD-10-CM | POA: Diagnosis not present

## 2016-08-19 DIAGNOSIS — K219 Gastro-esophageal reflux disease without esophagitis: Secondary | ICD-10-CM | POA: Insufficient documentation

## 2016-08-19 DIAGNOSIS — Z7982 Long term (current) use of aspirin: Secondary | ICD-10-CM | POA: Insufficient documentation

## 2016-08-19 DIAGNOSIS — Z880 Allergy status to penicillin: Secondary | ICD-10-CM | POA: Insufficient documentation

## 2016-08-19 DIAGNOSIS — F17210 Nicotine dependence, cigarettes, uncomplicated: Secondary | ICD-10-CM | POA: Insufficient documentation

## 2016-08-19 DIAGNOSIS — Z8673 Personal history of transient ischemic attack (TIA), and cerebral infarction without residual deficits: Secondary | ICD-10-CM | POA: Insufficient documentation

## 2016-08-19 DIAGNOSIS — M4800 Spinal stenosis, site unspecified: Secondary | ICD-10-CM

## 2016-08-19 HISTORY — PX: LUMBAR LAMINECTOMY/DECOMPRESSION MICRODISCECTOMY: SHX5026

## 2016-08-19 SURGERY — LUMBAR LAMINECTOMY/DECOMPRESSION MICRODISCECTOMY
Anesthesia: General | Site: Back

## 2016-08-19 MED ORDER — SURGIFOAM 100 EX MISC
CUTANEOUS | Status: DC | PRN
  Administered 2016-08-19: 20 mL via TOPICAL

## 2016-08-19 MED ORDER — MIDAZOLAM HCL 2 MG/2ML IJ SOLN
INTRAMUSCULAR | Status: AC
Start: 2016-08-19 — End: ?
  Filled 2016-08-19: qty 2

## 2016-08-19 MED ORDER — PHENYLEPHRINE 40 MCG/ML (10ML) SYRINGE FOR IV PUSH (FOR BLOOD PRESSURE SUPPORT)
PREFILLED_SYRINGE | INTRAVENOUS | Status: AC
  Filled 2016-08-19: qty 10

## 2016-08-19 MED ORDER — EPHEDRINE SULFATE-NACL 50-0.9 MG/10ML-% IV SOSY
PREFILLED_SYRINGE | INTRAVENOUS | Status: DC | PRN
  Administered 2016-08-19: 5 mg via INTRAVENOUS
  Administered 2016-08-19: 10 mg via INTRAVENOUS
  Administered 2016-08-19 (×6): 5 mg via INTRAVENOUS

## 2016-08-19 MED ORDER — THROMBIN 20000 UNITS EX SOLR
CUTANEOUS | Status: AC
  Filled 2016-08-19: qty 20000

## 2016-08-19 MED ORDER — MIDAZOLAM HCL 5 MG/5ML IJ SOLN
INTRAMUSCULAR | Status: DC | PRN
Start: 2016-08-19 — End: 2016-08-19
  Administered 2016-08-19: 2 mg via INTRAVENOUS

## 2016-08-19 MED ORDER — ONDANSETRON HCL 4 MG/2ML IJ SOLN
INTRAMUSCULAR | Status: DC | PRN
  Administered 2016-08-19: 4 mg via INTRAVENOUS

## 2016-08-19 MED ORDER — LIDOCAINE 2% (20 MG/ML) 5 ML SYRINGE
INTRAMUSCULAR | Status: DC | PRN
  Administered 2016-08-19: 60 mg via INTRAVENOUS

## 2016-08-19 MED ORDER — ONDANSETRON HCL 4 MG/2ML IJ SOLN
INTRAMUSCULAR | Status: AC
  Filled 2016-08-19: qty 2

## 2016-08-19 MED ORDER — BUPIVACAINE LIPOSOME 1.3 % IJ SUSP
20.0000 mL | Freq: Once | INTRAMUSCULAR | Status: AC
  Administered 2016-08-19: 20 mL
  Filled 2016-08-19: qty 20

## 2016-08-19 MED ORDER — PROPOFOL 10 MG/ML IV BOLUS
INTRAVENOUS | Status: AC
  Filled 2016-08-19: qty 20

## 2016-08-19 MED ORDER — EPHEDRINE 5 MG/ML INJ
INTRAVENOUS | Status: AC
  Filled 2016-08-19: qty 10

## 2016-08-19 MED ORDER — FENTANYL CITRATE (PF) 100 MCG/2ML IJ SOLN
INTRAMUSCULAR | Status: AC
  Administered 2016-08-19: 50 ug via INTRAVENOUS
  Filled 2016-08-19: qty 2

## 2016-08-19 MED ORDER — BUPIVACAINE-EPINEPHRINE (PF) 0.25% -1:200000 IJ SOLN
INTRAMUSCULAR | Status: AC
  Filled 2016-08-19: qty 30

## 2016-08-19 MED ORDER — LACTATED RINGERS IV SOLN: INTRAVENOUS | Status: DC

## 2016-08-19 MED ORDER — CHLORHEXIDINE GLUCONATE 4 % EX LIQD: 60.0000 mL | Freq: Once | CUTANEOUS | Status: DC

## 2016-08-19 MED ORDER — MEPERIDINE HCL 25 MG/ML IJ SOLN: 6.2500 mg | INTRAMUSCULAR | Status: DC | PRN

## 2016-08-19 MED ORDER — METHYLPREDNISOLONE ACETATE 40 MG/ML IJ SUSP
INTRAMUSCULAR | Status: AC
  Filled 2016-08-19: qty 1

## 2016-08-19 MED ORDER — FENTANYL CITRATE (PF) 100 MCG/2ML IJ SOLN
25.0000 ug | INTRAMUSCULAR | Status: DC | PRN
  Administered 2016-08-19 (×2): 50 ug via INTRAVENOUS

## 2016-08-19 MED ORDER — 0.9 % SODIUM CHLORIDE (POUR BTL) OPTIME
TOPICAL | Status: DC | PRN
  Administered 2016-08-19: 1000 mL

## 2016-08-19 MED ORDER — BUPIVACAINE-EPINEPHRINE 0.25% -1:200000 IJ SOLN
INTRAMUSCULAR | Status: DC | PRN
  Administered 2016-08-19: 28 mL

## 2016-08-19 MED ORDER — SUGAMMADEX SODIUM 200 MG/2ML IV SOLN
INTRAVENOUS | Status: DC | PRN
  Administered 2016-08-19: 200 mg via INTRAVENOUS

## 2016-08-19 MED ORDER — OXYCODONE-ACETAMINOPHEN 5-325 MG PO TABS
ORAL_TABLET | ORAL | Status: DC
Start: 2016-08-19 — End: 2016-08-19
  Filled 2016-08-19: qty 1

## 2016-08-19 MED ORDER — SUGAMMADEX SODIUM 200 MG/2ML IV SOLN
INTRAVENOUS | Status: AC
  Filled 2016-08-19: qty 2

## 2016-08-19 MED ORDER — OXYCODONE-ACETAMINOPHEN 5-325 MG PO TABS
1.0000 | ORAL_TABLET | Freq: Once | ORAL | Status: AC
  Administered 2016-08-19: 1 via ORAL

## 2016-08-19 MED ORDER — LIDOCAINE 2% (20 MG/ML) 5 ML SYRINGE
INTRAMUSCULAR | Status: AC
  Filled 2016-08-19: qty 5

## 2016-08-19 MED ORDER — FENTANYL CITRATE (PF) 250 MCG/5ML IJ SOLN
INTRAMUSCULAR | Status: DC | PRN
  Administered 2016-08-19: 50 ug via INTRAVENOUS
  Administered 2016-08-19: 25 ug via INTRAVENOUS
  Administered 2016-08-19: 50 ug via INTRAVENOUS

## 2016-08-19 MED ORDER — METHYLENE BLUE 0.5 % INJ SOLN
INTRAVENOUS | Status: DC | PRN
  Administered 2016-08-19: 1 mL

## 2016-08-19 MED ORDER — METOCLOPRAMIDE HCL 5 MG/ML IJ SOLN
10.0000 mg | Freq: Once | INTRAMUSCULAR | Status: AC | PRN
  Administered 2016-08-19: 10 mg via INTRAVENOUS

## 2016-08-19 MED ORDER — METHYLPREDNISOLONE ACETATE 40 MG/ML IJ SUSP
INTRAMUSCULAR | Status: DC | PRN
  Administered 2016-08-19: 40 mg

## 2016-08-19 MED ORDER — PHENYLEPHRINE 40 MCG/ML (10ML) SYRINGE FOR IV PUSH (FOR BLOOD PRESSURE SUPPORT)
PREFILLED_SYRINGE | INTRAVENOUS | Status: DC | PRN
  Administered 2016-08-19 (×2): 40 ug via INTRAVENOUS
  Administered 2016-08-19: 80 ug via INTRAVENOUS
  Administered 2016-08-19: 160 ug via INTRAVENOUS
  Administered 2016-08-19: 80 ug via INTRAVENOUS
  Administered 2016-08-19: 40 ug via INTRAVENOUS
  Administered 2016-08-19: 60 ug via INTRAVENOUS
  Administered 2016-08-19 (×2): 80 ug via INTRAVENOUS
  Administered 2016-08-19: 40 ug via INTRAVENOUS

## 2016-08-19 MED ORDER — FENTANYL CITRATE (PF) 250 MCG/5ML IJ SOLN
INTRAMUSCULAR | Status: AC
  Filled 2016-08-19: qty 5

## 2016-08-19 MED ORDER — PROPOFOL 10 MG/ML IV BOLUS
INTRAVENOUS | Status: DC | PRN
  Administered 2016-08-19: 140 mg via INTRAVENOUS

## 2016-08-19 MED ORDER — METHYLENE BLUE 0.5 % INJ SOLN
INTRAVENOUS | Status: AC
  Filled 2016-08-19: qty 10

## 2016-08-19 MED ORDER — ROCURONIUM BROMIDE 10 MG/ML (PF) SYRINGE
PREFILLED_SYRINGE | INTRAVENOUS | Status: AC
  Filled 2016-08-19: qty 5

## 2016-08-19 MED ORDER — METOCLOPRAMIDE HCL 5 MG/ML IJ SOLN
INTRAMUSCULAR | Status: AC
  Filled 2016-08-19: qty 2

## 2016-08-19 MED ORDER — ROCURONIUM BROMIDE 10 MG/ML (PF) SYRINGE
PREFILLED_SYRINGE | INTRAVENOUS | Status: DC | PRN
  Administered 2016-08-19: 50 mg via INTRAVENOUS
  Administered 2016-08-19 (×2): 10 mg via INTRAVENOUS

## 2016-08-19 MED ORDER — LACTATED RINGERS IV SOLN
INTRAVENOUS | Status: DC
  Administered 2016-08-19 (×3): via INTRAVENOUS

## 2016-08-19 SURGICAL SUPPLY — 74 items
APL SKNCLS STERI-STRIP NONHPOA (GAUZE/BANDAGES/DRESSINGS) ×1
BENZOIN TINCTURE PRP APPL 2/3 (GAUZE/BANDAGES/DRESSINGS) ×2 IMPLANT
BUR ROUND PRECISION 4.0 (BURR) ×2 IMPLANT
BUR ROUND PRECISION 4.0MM (BURR) ×1
CANISTER SUCT 3000ML PPV (MISCELLANEOUS) ×3 IMPLANT
CARTRIDGE OIL MAESTRO DRILL (MISCELLANEOUS) ×1 IMPLANT
CLOSURE WOUND 1/2 X4 (GAUZE/BANDAGES/DRESSINGS) ×1
CORDS BIPOLAR (ELECTRODE) ×3 IMPLANT
COVER SURGICAL LIGHT HANDLE (MISCELLANEOUS) ×3 IMPLANT
DIFFUSER DRILL AIR PNEUMATIC (MISCELLANEOUS) ×3 IMPLANT
DRAIN CHANNEL 15F RND FF W/TCR (WOUND CARE) IMPLANT
DRAPE POUCH INSTRU U-SHP 10X18 (DRAPES) ×6 IMPLANT
DRAPE SURG 17X23 STRL (DRAPES) ×12 IMPLANT
DURAPREP 26ML APPLICATOR (WOUND CARE) ×3 IMPLANT
ELECT BLADE 4.0 EZ CLEAN MEGAD (MISCELLANEOUS) ×3
ELECT CAUTERY BLADE 6.4 (BLADE) ×3 IMPLANT
ELECT REM PT RETURN 9FT ADLT (ELECTROSURGICAL) ×3
ELECTRODE BLDE 4.0 EZ CLN MEGD (MISCELLANEOUS) ×1 IMPLANT
ELECTRODE REM PT RTRN 9FT ADLT (ELECTROSURGICAL) ×1 IMPLANT
EVACUATOR SILICONE 100CC (DRAIN) IMPLANT
FILTER STRAW FLUID ASPIR (MISCELLANEOUS) ×3 IMPLANT
GAUZE SPONGE 4X4 12PLY STRL (GAUZE/BANDAGES/DRESSINGS) ×3 IMPLANT
GAUZE SPONGE 4X4 16PLY XRAY LF (GAUZE/BANDAGES/DRESSINGS) ×6 IMPLANT
GLOVE BIO SURGEON STRL SZ7 (GLOVE) ×3 IMPLANT
GLOVE BIO SURGEON STRL SZ8 (GLOVE) ×3 IMPLANT
GLOVE BIOGEL PI IND STRL 7.0 (GLOVE) ×1 IMPLANT
GLOVE BIOGEL PI IND STRL 8 (GLOVE) ×1 IMPLANT
GLOVE BIOGEL PI INDICATOR 7.0 (GLOVE) ×2
GLOVE BIOGEL PI INDICATOR 8 (GLOVE) ×2
GOWN STRL REUS W/ TWL LRG LVL3 (GOWN DISPOSABLE) ×1 IMPLANT
GOWN STRL REUS W/ TWL XL LVL3 (GOWN DISPOSABLE) ×2 IMPLANT
GOWN STRL REUS W/TWL LRG LVL3 (GOWN DISPOSABLE) ×3
GOWN STRL REUS W/TWL XL LVL3 (GOWN DISPOSABLE) ×6
IV CATH 14GX2 1/4 (CATHETERS) ×3 IMPLANT
KIT BASIN OR (CUSTOM PROCEDURE TRAY) ×3 IMPLANT
KIT POSITION SURG JACKSON T1 (MISCELLANEOUS) ×3 IMPLANT
KIT ROOM TURNOVER OR (KITS) ×3 IMPLANT
NDL 18GX1X1/2 (RX/OR ONLY) (NEEDLE) ×1 IMPLANT
NDL HYPO 25GX1X1/2 BEV (NEEDLE) ×1 IMPLANT
NDL SPNL 18GX3.5 QUINCKE PK (NEEDLE) ×2 IMPLANT
NEEDLE 18GX1X1/2 (RX/OR ONLY) (NEEDLE) ×3 IMPLANT
NEEDLE 22X1 1/2 (OR ONLY) (NEEDLE) ×3 IMPLANT
NEEDLE HYPO 25GX1X1/2 BEV (NEEDLE) ×3 IMPLANT
NEEDLE SPNL 18GX3.5 QUINCKE PK (NEEDLE) ×6 IMPLANT
NS IRRIG 1000ML POUR BTL (IV SOLUTION) ×3 IMPLANT
OIL CARTRIDGE MAESTRO DRILL (MISCELLANEOUS) ×3
PACK LAMINECTOMY ORTHO (CUSTOM PROCEDURE TRAY) ×3 IMPLANT
PACK UNIVERSAL I (CUSTOM PROCEDURE TRAY) ×3 IMPLANT
PAD ARMBOARD 7.5X6 YLW CONV (MISCELLANEOUS) ×6 IMPLANT
PATTIES SURGICAL .5 X.5 (GAUZE/BANDAGES/DRESSINGS) IMPLANT
PATTIES SURGICAL .5 X1 (DISPOSABLE) ×3 IMPLANT
SPONGE INTESTINAL PEANUT (DISPOSABLE) ×3 IMPLANT
SPONGE SURGIFOAM ABS GEL 100 (HEMOSTASIS) ×3 IMPLANT
SPONGE SURGIFOAM ABS GEL SZ50 (HEMOSTASIS) ×3 IMPLANT
STRIP CLOSURE SKIN 1/2X4 (GAUZE/BANDAGES/DRESSINGS) ×1 IMPLANT
SURGIFLO W/THROMBIN 8M KIT (HEMOSTASIS) IMPLANT
SUT MNCRL AB 4-0 PS2 18 (SUTURE) ×3 IMPLANT
SUT VIC AB 0 CT1 18XCR BRD 8 (SUTURE) IMPLANT
SUT VIC AB 0 CT1 27 (SUTURE)
SUT VIC AB 0 CT1 27XBRD ANBCTR (SUTURE) IMPLANT
SUT VIC AB 0 CT1 8-18 (SUTURE)
SUT VIC AB 1 CT1 18XCR BRD 8 (SUTURE) ×1 IMPLANT
SUT VIC AB 1 CT1 8-18 (SUTURE) ×3
SUT VIC AB 2-0 CT2 18 VCP726D (SUTURE) ×3 IMPLANT
SYR 20CC LL (SYRINGE) ×3 IMPLANT
SYR BULB IRRIGATION 50ML (SYRINGE) ×3 IMPLANT
SYR CONTROL 10ML LL (SYRINGE) ×6 IMPLANT
SYR TB 1ML 26GX3/8 SAFETY (SYRINGE) ×6 IMPLANT
SYR TB 1ML LUER SLIP (SYRINGE) ×6 IMPLANT
TAPE CLOTH SURG 4X10 WHT LF (GAUZE/BANDAGES/DRESSINGS) ×2 IMPLANT
TOWEL OR 17X24 6PK STRL BLUE (TOWEL DISPOSABLE) ×3 IMPLANT
TOWEL OR 17X26 10 PK STRL BLUE (TOWEL DISPOSABLE) ×3 IMPLANT
WATER STERILE IRR 1000ML POUR (IV SOLUTION) ×3 IMPLANT
YANKAUER SUCT BULB TIP NO VENT (SUCTIONS) ×3 IMPLANT

## 2016-08-19 NOTE — Anesthesia Procedure Notes (Signed)
Procedure Name: Intubation Date/Time: 08/19/2016 1:40 PM Performed by: Freddie Breech Pre-anesthesia Checklist: Patient identified, Emergency Drugs available, Suction available and Patient being monitored Patient Re-evaluated:Patient Re-evaluated prior to induction Oxygen Delivery Method: Circle System Utilized Preoxygenation: Pre-oxygenation with 100% oxygen Induction Type: IV induction Ventilation: Mask ventilation without difficulty Laryngoscope Size: Mac and 3 Grade View: Grade I Tube type: Oral Tube size: 7.0 mm Number of attempts: 1 Airway Equipment and Method: Stylet and Oral airway Placement Confirmation: ETT inserted through vocal cords under direct vision,  positive ETCO2 and breath sounds checked- equal and bilateral Secured at: 21 cm Tube secured with: Tape Dental Injury: Teeth and Oropharynx as per pre-operative assessment

## 2016-08-19 NOTE — H&P (Signed)
PREOPERATIVE H&P  Chief Complaint: Right leg pain  HPI: Wanda James is a 61 y.o. female who presents with ongoing pain in the right leg  MRI reveals stenosis at L4/5  Patient has failed multiple forms of conservative care and continues to have pain (see office notes for additional details regarding the patient's full course of treatment)  Past Medical History:  Diagnosis Date  . Acid reflux   . Arthritis   . Cancer ([place])    kidney  . Cataract   . Chiari malformation type I ([place])   . Complication of anesthesia    Hard To Awaken  . COPD (chronic obstructive pulmonary disease) ([place])   . Diverticulitis   . Headache   . Pneumonia   . Stroke ([place])   . Subclavian artery stenosis, left ([facility])    s/p angioplasty/stent 04/25/08   Past Surgical History:  Procedure Laterality Date  . ABDOMINAL HYSTERECTOMY     partial  . BREAST SURGERY     left lumpectomy  . carotidendarterectomy    . COLON SURGERY    . EYE SURGERY    . INNER EAR SURGERY     right  . NEPHRECTOMY     left  . PARTIAL COLECTOMY    . RHINOPLASTY    . TONSILLECTOMY    . TUBAL LIGATION     Social History   Social History  . Marital status: Married    Spouse name: N/A  . Number of children: N/A  . Years of education: N/A   Social History Main Topics  . Smoking status: Current Every Day Smoker    Packs/day: 1.00    Types: Cigarettes  . Smokeless tobacco: Never Used  . Alcohol use No  . Drug use: No  . Sexual activity: Not on file   Other Topics Concern  . Not on file   Social History Narrative  . No narrative on file   Family History  Problem Relation Age of Onset  . Lung cancer Father    Allergies  Allergen Reactions  . Iohexol Anaphylaxis     Desc: ANAPHALAXIS-ARS ON 08-22-04 Went into a coma  . Sodium Hypochlorite Other (See Comments)    Solution used to place on packing burned very bad, patient states she can't use it any more due to this/thjcma  . Penicillins Hives and  Swelling    Has patient had a PCN reaction causing immediate rash, facial/tongue/throat swelling, SOB or lightheadedness with hypotension: No Has patient had a PCN reaction causing severe rash involving mucus membranes or skin necrosis: Yes Has patient had a PCN reaction that required hospitalization: No Has patient had a PCN reaction occurring within the last 10 years: No If all of the above answers are "NO", then may proceed with Cephalosporin use.   Marland Kitchen Hydrocodone Nausea And Vomiting  . Tape Rash    Itchy rash   Prior to Admission medications   Medication Sig Start Date End Date Taking? Authorizing Provider  aspirin EC 81 MG tablet Take 81 mg by mouth daily.   Yes [provider]  Aspirin-Salicylamide-Caffeine (BC HEADACHE POWDER PO) Take 1 packet by mouth daily as needed (headache).    Yes [provider]  diclofenac (VOLTAREN) 75 MG EC tablet Take 75 mg by mouth 2 (two) times daily.   Yes [provider]  diphenhydrAMINE (BENADRYL) 25 MG tablet Take 25 mg by mouth 2 (two) times daily as needed (allergic reactions).   Yes [provider]  docusate sodium (COLACE) 100 MG capsule Take 100 mg by mouth as needed for mild constipation.   Yes [provider]  Melatonin 3 MG TABS Take 3 mg by mouth at bedtime.   Yes [provider]  traMADol (ULTRAM) 50 MG tablet Take 50 mg by mouth every 6 (six) hours as needed for moderate pain.   Yes [provider]     All other systems have been reviewed and were otherwise negative with the exception of those mentioned in the HPI and as above.  Physical Exam: There were no vitals filed for this visit.  General: Alert, no acute distress Cardiovascular: No pedal edema Respiratory: No cyanosis, no use of accessory musculature Skin: No lesions in the area of chief complaint Neurologic: Sensation intact distally Psychiatric: Patient is competent for consent with normal mood and  affect Lymphatic: No axillary or cervical lymphadenopathy   Assessment/Plan: RIGHT LEG PAIN  Plan for Procedure(s): LUMBAR 4-5 DECOMPRESSION   Sinclair Ship, MD 08/19/2016 7:30 AM

## 2016-08-19 NOTE — Transfer of Care (Signed)
Immediate Anesthesia Transfer of Care Note  Patient: Wanda James  Procedure(s) Performed: Procedure(s) with comments: LUMBAR 4-5 DECOMPRESSION (N/A) - LUMBAR 4-5 DECOMPRESSION REQUESTED TIME 2.5 HRS   Patient Location: PACU  Anesthesia Type:General  Level of Consciousness: awake, alert  and oriented  Airway & Oxygen Therapy: Patient Spontanous Breathing and Patient connected to nasal cannula oxygen  Post-op Assessment: Report given to RN and Post -op Vital signs reviewed and stable  Post vital signs: Reviewed and stable  Last Vitals:  Vitals:   08/19/16 0902 08/19/16 1647  BP: 135/76   Pulse: 64   Resp: 18   Temp: 36.5 C (P) 36.6 C  SpO2: 97%     Last Pain:  Vitals:   08/19/16 1647  TempSrc:   PainSc: (P) 0-No pain      Patients Stated Pain Goal: 3 (09/40/76 8088)  Complications: No apparent anesthesia complications

## 2016-08-19 NOTE — Anesthesia Preprocedure Evaluation (Signed)
Anesthesia Evaluation  Patient identified by MRN, date of birth, ID band Patient awake    Reviewed: Allergy & Precautions, NPO status , Patient's Chart, lab work & pertinent test results  History of Anesthesia Complications (+) PROLONGED EMERGENCE  Airway Mallampati: II  TM Distance: >3 FB Neck ROM: Full    Dental no notable dental hx. (+) Edentulous Upper, Edentulous Lower   Pulmonary COPD, Current Smoker,    Pulmonary exam normal breath sounds clear to auscultation       Cardiovascular negative cardio ROS Normal cardiovascular exam Rhythm:Regular Rate:Normal     Neuro/Psych CVA, No Residual Symptoms negative psych ROS   GI/Hepatic negative GI ROS, Neg liver ROS,   Endo/Other  negative endocrine ROS  Renal/GU negative Renal ROS  negative genitourinary   Musculoskeletal negative musculoskeletal ROS (+)   Abdominal   Peds negative pediatric ROS (+)  Hematology negative hematology ROS (+)   Anesthesia Other Findings   Reproductive/Obstetrics negative OB ROS                             Anesthesia Physical Anesthesia Plan  ASA: III  Anesthesia Plan: General   Post-op Pain Management:    Induction: Intravenous  PONV Risk Score and Plan: 2 and Ondansetron, Dexamethasone and Treatment may vary due to age or medical condition  Airway Management Planned: Oral ETT  Additional Equipment:   Intra-op Plan:   Post-operative Plan: Extubation in OR  Informed Consent: I have reviewed the patients History and Physical, chart, labs and discussed the procedure including the risks, benefits and alternatives for the proposed anesthesia with the patient or authorized representative who has indicated his/her understanding and acceptance.   Dental advisory given  Plan Discussed with: CRNA  Anesthesia Plan Comments:         Anesthesia Quick Evaluation

## 2016-08-20 ENCOUNTER — Encounter (HOSPITAL_COMMUNITY): Payer: Self-pay | Admitting: Orthopedic Surgery

## 2016-08-20 NOTE — Op Note (Signed)
NAME:  Wanda James                      ACCOUNT NO.:  MEDICAL RECORD NO.:  [id number]  PHYSICIAN:  Phylliss Bob, MD           DATE OF BIRTH:  DATE OF PROCEDURE:  08/19/2016                              OPERATIVE REPORT   PREOPERATIVE DIAGNOSES: 1. Right-sided lumbar radiculopathy. 2. Moderate-to-severe spinal stenosis, L4-L5.  POSTOPERATIVE DIAGNOSES: 1. Right-sided lumbar radiculopathy. 2. Moderate-to-severe spinal stenosis, L4-L5.  PROCEDURE:  L4-5 laminectomy with bilateral partial facetectomy and foraminotomy.  SURGEON:  Phylliss Bob, MD.  ASSISTANTPricilla Holm, PA-C.  ANESTHESIA:  General endotracheal anesthesia.  COMPLICATIONS:  None.  DISPOSITION:  Stable.  ESTIMATED BLOOD LOSS:  Minimal.  INDICATIONS FOR SURGERY:  Briefly, Ms. Wanda James is a pleasant 61 year old female, who did initially present to me with ongoing and rather debilitating pain in her right leg.  The pain did extend into her thigh and leg and into her foot.  Her symptoms were very much consistent with right-sided L5 radiculopathy.  The patient's MRI was notable for the stenosis outlined above, with compression of both the right and left L5 nerves.  She did get excellent temporary benefit for just 1 hour with a right L5 selective nerve block, which did help confirm the diagnosis. Given the patient's ongoing pain and lack of improvement, we did discuss proceeding with the procedure reflected above.  After a full understanding of the risks and limitations of surgery, the patient did elect to proceed.  OPERATIVE DETAILS:  On August 19, 2016, the patient was brought to surgery and general endotracheal anesthesia was administered.  The patient was placed prone on a well-padded flat Jackson bed with a spinal frame.  Antibiotics were given and a time-out procedure was performed. The back was prepped and draped and a midline incision was made overlying the L4-5 intervertebral space.  The fascia was  incised at the midline and the paraspinal musculature was bluntly swept laterally and retracted.  I then subperiosteally exposed the lamina of L4 and L5 bilaterally.  I then obtained an intraoperative lateral x-ray to confirm the appropriate operative level.  At this point, the spinous process of L4 was removed.  The medial and inferior aspect of the L4 lamina were removed, as was the ligamentum flavum, first on the right side, and then on the left.  I then meticulously performed a bilateral partial facetectomy and foraminotomy, entirely compressing both the right and left L5 nerves.  I was very pleased with the decompression that I was able to accomplish.  The wound was then copiously irrigated.  All bleeding was controlled.  Depo-Medrol 40 mg was then introduced about the epidural space.  The wound was then closed in layers using #1 Vicryl, followed by 2-0 Vicryl, followed by 4-0 Monocryl.  Benzoin and Steri-Strips were applied, followed by sterile dressing.  All instrument counts were correct at the termination of the procedure.  Of note, Pricilla Holm, PA-C, was my assistant throughout surgery, and did aid in retraction, suctioning, and closure throughout surgery.     Phylliss Bob, MD     MD/MEDQ  D:  08/19/2016  T:  08/19/2016  Job:  [id number]  cc:   Gaye Alken, PA-C

## 2016-08-20 NOTE — Anesthesia Postprocedure Evaluation (Signed)
Anesthesia Post Note  Patient: Wanda James  Procedure(s) Performed: Procedure(s) (LRB): LUMBAR 4-5 DECOMPRESSION (N/A)     Patient location during evaluation: PACU Anesthesia Type: General Level of consciousness: awake and alert and patient cooperative Pain management: pain level controlled Vital Signs Assessment: post-procedure vital signs reviewed and stable Respiratory status: spontaneous breathing and respiratory function stable Cardiovascular status: stable Anesthetic complications: no    Last Vitals:  Vitals:   08/19/16 1715 08/19/16 1730  BP: 130/74 (!) 141/82  Pulse: 77 77  Resp: 12 10  Temp:  (!) 36.3 C  SpO2: 92% 92%    Last Pain:  Vitals:   08/19/16 1730  TempSrc:   PainSc: [place]

## 2016-09-15 ENCOUNTER — Emergency Department (HOSPITAL_COMMUNITY)
Admission: EM | Admit: 2016-09-15 | Discharge: 2016-09-15 | Discharge status: Against Medical Advice | Payer: BLUE CROSS/BLUE SHIELD | Attending: Emergency Medicine | Admitting: Emergency Medicine

## 2016-09-15 ENCOUNTER — Encounter (HOSPITAL_COMMUNITY): Payer: Self-pay | Admitting: Emergency Medicine

## 2016-09-15 DIAGNOSIS — R1110 Vomiting, unspecified: Secondary | ICD-10-CM | POA: Insufficient documentation

## 2016-09-15 DIAGNOSIS — R197 Diarrhea, unspecified: Secondary | ICD-10-CM | POA: Diagnosis not present

## 2016-09-15 DIAGNOSIS — Z5321 Procedure and treatment not carried out due to patient leaving prior to being seen by health care provider: Secondary | ICD-10-CM | POA: Insufficient documentation

## 2016-09-15 LAB — COMPREHENSIVE METABOLIC PANEL
ALBUMIN: 3.3 g/dL — AB (ref 3.5–5.0)
ALT: 12 U/L — ABNORMAL LOW (ref 14–54)
AST: 23 U/L (ref 15–41)
Alkaline Phosphatase: 110 U/L (ref 38–126)
Anion gap: 9 (ref 5–15)
BILIRUBIN TOTAL: 0.2 mg/dL — AB (ref 0.3–1.2)
BUN: 10 mg/dL (ref 6–20)
CHLORIDE: 106 mmol/L (ref 101–111)
CO2: 23 mmol/L (ref 22–32)
Calcium: 9.4 mg/dL (ref 8.9–10.3)
Creatinine, Ser: 0.86 mg/dL (ref 0.44–1.00)
GFR calc Af Amer: >60 mL/min (ref >60)
GFR calc non Af Amer: >60 mL/min (ref >60)
GLUCOSE: 137 mg/dL — AB (ref 65–99)
Potassium: 3.5 mmol/L (ref 3.5–5.1)
Sodium: 138 mmol/L (ref 135–145)
TOTAL PROTEIN: 6.9 g/dL (ref 6.5–8.1)

## 2016-09-15 LAB — CBC
HEMATOCRIT: 44.7 % (ref 36.0–46.0)
Hemoglobin: 14.8 g/dL (ref 12.0–15.0)
MCH: 29.4 pg (ref 26.0–34.0)
MCHC: 33.1 g/dL (ref 30.0–36.0)
MCV: 88.9 fL (ref 78.0–100.0)
Platelets: 325 10*3/uL (ref 150–400)
RBC: 5.03 MIL/uL (ref 3.87–5.11)
RDW: 14.2 % (ref 11.5–15.5)
WBC: 9.4 10*3/uL (ref 4.0–10.5)

## 2016-09-15 LAB — LIPASE, BLOOD: Lipase: 61 U/L — ABNORMAL HIGH (ref 11–51)

## 2016-09-15 NOTE — ED Triage Notes (Signed)
Pt reports n/v for the past few weeks, seen at [facility] medical and placed on abx for suspected uti, pt reports diarrhea that began this am.

## 2017-05-09 ENCOUNTER — Ambulatory Visit: Payer: BLUE CROSS/BLUE SHIELD | Admitting: Diagnostic Neuroimaging

## 2017-06-26 ENCOUNTER — Encounter (HOSPITAL_COMMUNITY): Payer: Self-pay

## 2017-06-26 ENCOUNTER — Emergency Department (HOSPITAL_COMMUNITY): Payer: BLUE CROSS/BLUE SHIELD

## 2017-06-26 ENCOUNTER — Other Ambulatory Visit: Payer: Self-pay

## 2017-06-26 ENCOUNTER — Inpatient Hospital Stay (HOSPITAL_COMMUNITY)
Admission: EM | Admit: 2017-06-26 | Discharge: 2017-07-01 | DRG: 190 | Disposition: A | Discharge status: Home / Self Care | Payer: BLUE CROSS/BLUE SHIELD | Attending: Internal Medicine | Admitting: Internal Medicine

## 2017-06-26 DIAGNOSIS — Z7982 Long term (current) use of aspirin: Secondary | ICD-10-CM

## 2017-06-26 DIAGNOSIS — Z79899 Other long term (current) drug therapy: Secondary | ICD-10-CM | POA: Diagnosis not present

## 2017-06-26 DIAGNOSIS — T380X5A Adverse effect of glucocorticoids and synthetic analogues, initial encounter: Secondary | ICD-10-CM | POA: Diagnosis not present

## 2017-06-26 DIAGNOSIS — J181 Lobar pneumonia, unspecified organism: Secondary | ICD-10-CM | POA: Diagnosis not present

## 2017-06-26 DIAGNOSIS — D72829 Elevated white blood cell count, unspecified: Secondary | ICD-10-CM | POA: Diagnosis not present

## 2017-06-26 DIAGNOSIS — E119 Type 2 diabetes mellitus without complications: Secondary | ICD-10-CM

## 2017-06-26 DIAGNOSIS — Z880 Allergy status to penicillin: Secondary | ICD-10-CM | POA: Diagnosis not present

## 2017-06-26 DIAGNOSIS — Z8673 Personal history of transient ischemic attack (TIA), and cerebral infarction without residual deficits: Secondary | ICD-10-CM | POA: Diagnosis not present

## 2017-06-26 DIAGNOSIS — Z955 Presence of coronary angioplasty implant and graft: Secondary | ICD-10-CM

## 2017-06-26 DIAGNOSIS — E1165 Type 2 diabetes mellitus with hyperglycemia: Secondary | ICD-10-CM | POA: Diagnosis not present

## 2017-06-26 DIAGNOSIS — Z720 Tobacco use: Secondary | ICD-10-CM | POA: Diagnosis not present

## 2017-06-26 DIAGNOSIS — R739 Hyperglycemia, unspecified: Secondary | ICD-10-CM | POA: Diagnosis not present

## 2017-06-26 DIAGNOSIS — R0902 Hypoxemia: Secondary | ICD-10-CM

## 2017-06-26 DIAGNOSIS — R51 Headache: Secondary | ICD-10-CM | POA: Diagnosis present

## 2017-06-26 DIAGNOSIS — J189 Pneumonia, unspecified organism: Secondary | ICD-10-CM

## 2017-06-26 DIAGNOSIS — J441 Chronic obstructive pulmonary disease with (acute) exacerbation: Secondary | ICD-10-CM | POA: Diagnosis not present

## 2017-06-26 DIAGNOSIS — J9601 Acute respiratory failure with hypoxia: Secondary | ICD-10-CM | POA: Diagnosis present

## 2017-06-26 DIAGNOSIS — M1990 Unspecified osteoarthritis, unspecified site: Secondary | ICD-10-CM | POA: Diagnosis present

## 2017-06-26 DIAGNOSIS — Z85528 Personal history of other malignant neoplasm of kidney: Secondary | ICD-10-CM | POA: Diagnosis not present

## 2017-06-26 DIAGNOSIS — K219 Gastro-esophageal reflux disease without esophagitis: Secondary | ICD-10-CM | POA: Diagnosis present

## 2017-06-26 DIAGNOSIS — K8689 Other specified diseases of pancreas: Secondary | ICD-10-CM | POA: Diagnosis present

## 2017-06-26 DIAGNOSIS — Z885 Allergy status to narcotic agent status: Secondary | ICD-10-CM

## 2017-06-26 DIAGNOSIS — J8410 Pulmonary fibrosis, unspecified: Secondary | ICD-10-CM | POA: Diagnosis present

## 2017-06-26 DIAGNOSIS — Z905 Acquired absence of kidney: Secondary | ICD-10-CM | POA: Diagnosis not present

## 2017-06-26 DIAGNOSIS — J440 Chronic obstructive pulmonary disease with acute lower respiratory infection: Secondary | ICD-10-CM | POA: Diagnosis present

## 2017-06-26 DIAGNOSIS — F17200 Nicotine dependence, unspecified, uncomplicated: Secondary | ICD-10-CM | POA: Diagnosis present

## 2017-06-26 DIAGNOSIS — R519 Headache, unspecified: Secondary | ICD-10-CM

## 2017-06-26 DIAGNOSIS — Z91048 Other nonmedicinal substance allergy status: Secondary | ICD-10-CM | POA: Diagnosis not present

## 2017-06-26 DIAGNOSIS — R0602 Shortness of breath: Secondary | ICD-10-CM

## 2017-06-26 LAB — CBC WITH DIFFERENTIAL/PLATELET
BASOS ABS: 0.1 10*3/uL (ref 0.0–0.1)
BASOS PCT: 1 %
EOS PCT: 5 %
Eosinophils Absolute: 0.3 10*3/uL (ref 0.0–0.7)
HCT: 47.2 % — ABNORMAL HIGH (ref 36.0–46.0)
Hemoglobin: 15.3 g/dL — ABNORMAL HIGH (ref 12.0–15.0)
Lymphocytes Relative: 23 %
Lymphs Abs: 1.3 10*3/uL (ref 0.7–4.0)
MCH: 29.7 pg (ref 26.0–34.0)
MCHC: 32.4 g/dL (ref 30.0–36.0)
MCV: 91.7 fL (ref 78.0–100.0)
MONO ABS: 0.4 10*3/uL (ref 0.1–1.0)
Monocytes Relative: 7 %
Neutro Abs: 3.6 10*3/uL (ref 1.7–7.7)
Neutrophils Relative %: 64 %
PLATELETS: 288 10*3/uL (ref 150–400)
RBC: 5.15 MIL/uL — ABNORMAL HIGH (ref 3.87–5.11)
RDW: 15.2 % (ref 11.5–15.5)
WBC: 5.5 10*3/uL (ref 4.0–10.5)

## 2017-06-26 LAB — BASIC METABOLIC PANEL
Anion gap: 13 (ref 5–15)
BUN: 20 mg/dL (ref 6–20)
CO2: 24 mmol/L (ref 22–32)
CREATININE: 0.94 mg/dL (ref 0.44–1.00)
Calcium: 9 mg/dL (ref 8.9–10.3)
Chloride: 106 mmol/L (ref 101–111)
GFR calc Af Amer: >60 mL/min (ref >60)
Glucose, Bld: 103 mg/dL — ABNORMAL HIGH (ref 65–99)
POTASSIUM: 4 mmol/L (ref 3.5–5.1)
Sodium: 143 mmol/L (ref 135–145)

## 2017-06-26 LAB — I-STAT TROPONIN, ED: Troponin i, poc: 0 ng/mL (ref 0.00–0.08)

## 2017-06-26 LAB — BRAIN NATRIURETIC PEPTIDE: B NATRIURETIC PEPTIDE 5: 25 pg/mL (ref 0.0–100.0)

## 2017-06-26 MED ORDER — MELATONIN 3 MG PO TABS
3.0000 mg | ORAL_TABLET | Freq: Every day | ORAL | Status: DC
  Administered 2017-06-26 – 2017-06-30 (×5): 3 mg via ORAL
  Filled 2017-06-26 (×5): qty 1

## 2017-06-26 MED ORDER — IPRATROPIUM-ALBUTEROL 0.5-2.5 (3) MG/3ML IN SOLN
3.0000 mL | Freq: Once | RESPIRATORY_TRACT | Status: AC
  Administered 2017-06-26: 3 mL via RESPIRATORY_TRACT
  Filled 2017-06-26: qty 3

## 2017-06-26 MED ORDER — METHYLPREDNISOLONE SODIUM SUCC 125 MG IJ SOLR
80.0000 mg | Freq: Four times a day (QID) | INTRAMUSCULAR | Status: DC
  Administered 2017-06-26 (×2): 80 mg via INTRAVENOUS
  Administered 2017-06-27: 62.5 mg via INTRAVENOUS
  Administered 2017-06-27 – 2017-06-28 (×3): 80 mg via INTRAVENOUS
  Administered 2017-06-28: 125 mg via INTRAVENOUS
  Filled 2017-06-26 (×9): qty 2

## 2017-06-26 MED ORDER — ENOXAPARIN SODIUM 40 MG/0.4ML ~~LOC~~ SOLN
40.0000 mg | SUBCUTANEOUS | Status: DC
  Administered 2017-06-26 – 2017-06-30 (×5): 40 mg via SUBCUTANEOUS
  Filled 2017-06-26 (×5): qty 0.4

## 2017-06-26 MED ORDER — SODIUM CHLORIDE 0.9% FLUSH: 3.0000 mL | INTRAVENOUS | Status: DC | PRN

## 2017-06-26 MED ORDER — GUAIFENESIN-DM 100-10 MG/5ML PO SYRP
5.0000 mL | ORAL_SOLUTION | ORAL | Status: DC | PRN
  Administered 2017-06-27: 5 mL via ORAL
  Filled 2017-06-26 (×2): qty 10

## 2017-06-26 MED ORDER — ALBUTEROL SULFATE (2.5 MG/3ML) 0.083% IN NEBU
2.5000 mg | INHALATION_SOLUTION | RESPIRATORY_TRACT | Status: DC | PRN
Start: 2017-06-26 — End: 2017-07-01
  Administered 2017-06-30: 2.5 mg via RESPIRATORY_TRACT
  Filled 2017-06-26 (×2): qty 3

## 2017-06-26 MED ORDER — LEVOFLOXACIN IN D5W 500 MG/100ML IV SOLN
500.0000 mg | Freq: Once | INTRAVENOUS | Status: AC
  Administered 2017-06-26: 500 mg via INTRAVENOUS
  Filled 2017-06-26: qty 100

## 2017-06-26 MED ORDER — TRAMADOL HCL 50 MG PO TABS
50.0000 mg | ORAL_TABLET | Freq: Four times a day (QID) | ORAL | Status: DC | PRN
  Administered 2017-06-27 – 2017-07-01 (×8): 50 mg via ORAL
  Filled 2017-06-26 (×8): qty 1

## 2017-06-26 MED ORDER — SODIUM CHLORIDE 0.9 % IV SOLN
250.0000 mL | INTRAVENOUS | Status: DC | PRN
Start: 2017-06-26 — End: 2017-07-01

## 2017-06-26 MED ORDER — ONDANSETRON HCL 4 MG/2ML IJ SOLN
4.0000 mg | Freq: Four times a day (QID) | INTRAMUSCULAR | Status: DC | PRN
  Administered 2017-06-27 – 2017-06-28 (×2): 4 mg via INTRAVENOUS
  Filled 2017-06-26 (×2): qty 2

## 2017-06-26 MED ORDER — PANTOPRAZOLE SODIUM 40 MG PO TBEC
80.0000 mg | DELAYED_RELEASE_TABLET | Freq: Every day | ORAL | Status: DC
  Administered 2017-06-27 – 2017-07-01 (×5): 80 mg via ORAL
  Filled 2017-06-26 (×5): qty 2

## 2017-06-26 MED ORDER — METHYLPREDNISOLONE SODIUM SUCC 125 MG IJ SOLR
125.0000 mg | Freq: Once | INTRAMUSCULAR | Status: AC
  Administered 2017-06-26: 125 mg via INTRAVENOUS
  Filled 2017-06-26: qty 2

## 2017-06-26 MED ORDER — ONDANSETRON HCL 4 MG PO TABS: 4.0000 mg | ORAL_TABLET | Freq: Four times a day (QID) | ORAL | Status: DC | PRN

## 2017-06-26 MED ORDER — ALBUTEROL SULFATE (2.5 MG/3ML) 0.083% IN NEBU
5.0000 mg | INHALATION_SOLUTION | Freq: Once | RESPIRATORY_TRACT | Status: AC
  Administered 2017-06-26: 5 mg via RESPIRATORY_TRACT
  Filled 2017-06-26: qty 6

## 2017-06-26 MED ORDER — DOXYCYCLINE HYCLATE 100 MG PO TABS
100.0000 mg | ORAL_TABLET | Freq: Two times a day (BID) | ORAL | Status: DC
  Administered 2017-06-26 – 2017-06-27 (×3): 100 mg via ORAL
  Filled 2017-06-26 (×3): qty 1

## 2017-06-26 MED ORDER — ACETAMINOPHEN 650 MG RE SUPP: 650.0000 mg | Freq: Four times a day (QID) | RECTAL | Status: DC | PRN

## 2017-06-26 MED ORDER — IPRATROPIUM-ALBUTEROL 0.5-2.5 (3) MG/3ML IN SOLN
3.0000 mL | Freq: Four times a day (QID) | RESPIRATORY_TRACT | Status: DC
  Administered 2017-06-26 – 2017-06-27 (×4): 3 mL via RESPIRATORY_TRACT
  Filled 2017-06-26 (×5): qty 3

## 2017-06-26 MED ORDER — ACETAMINOPHEN 325 MG PO TABS
650.0000 mg | ORAL_TABLET | Freq: Four times a day (QID) | ORAL | Status: DC | PRN
  Administered 2017-06-26 – 2017-07-01 (×7): 650 mg via ORAL
  Filled 2017-06-26 (×7): qty 2

## 2017-06-26 MED ORDER — SODIUM CHLORIDE 0.9% FLUSH
3.0000 mL | Freq: Two times a day (BID) | INTRAVENOUS | Status: DC
  Administered 2017-06-26 – 2017-07-01 (×7): 3 mL via INTRAVENOUS

## 2017-06-26 MED ORDER — ASPIRIN EC 325 MG PO TBEC
325.0000 mg | DELAYED_RELEASE_TABLET | Freq: Every day | ORAL | Status: DC
  Administered 2017-06-27 – 2017-07-01 (×5): 325 mg via ORAL
  Filled 2017-06-26 (×5): qty 1

## 2017-06-26 MED ORDER — PANCRELIPASE (LIP-PROT-AMYL) 12000-38000 UNITS PO CPEP
24000.0000 [IU] | ORAL_CAPSULE | Freq: Three times a day (TID) | ORAL | Status: DC
  Administered 2017-06-27 – 2017-06-30 (×5): 24000 [IU] via ORAL
  Administered 2017-07-01: 12000 [IU] via ORAL
  Filled 2017-06-26 (×12): qty 2

## 2017-06-26 NOTE — ED Triage Notes (Signed)
PT C/O SOB X2-3 WEEKS. PT STS SHE HAS A HX OF COPD, AND SMOKES 1PPD, BTU THIS STARTED AFTER GETTING REMODELING DONE TO HER HOUSE. PT WENT TO HER PCP TODAY, O2 SAT 88%, SO SHE WAS TOLD TO COME HERE. O2 SAT 90-91% ON RA. O2 AT 3L GIVEN IN TRIAGE.

## 2017-06-26 NOTE — ED Notes (Signed)
Pt ambulatory to restroom

## 2017-06-26 NOTE — ED Provider Notes (Signed)
[facility] DEPT Provider Note   CSN: [id number] Arrival date & time: 06/26/17  1302     History   Chief Complaint Chief Complaint  Patient presents with  . Shortness of Breath    HPI Wanda James is a 62 y.o. female.  HPI Patient reports for 2 weeks she has had increasing difficulty breathing.  2 weeks ago her house is being remodeled and they used Patent examiner and sanded the floors.  She reports the smell was very strong.  She and her husband moved out temporarily.  She reports however ever since then she is been coughing and having progressively increasing difficulty breathing.  Patient is a daily smoker.  She smoked up to 2 packs/day.  Patient did continue to smoke today as well.  She was seen by her PCP today and found to be hypoxic and referred to the emergency department.  Patient does not use home oxygen.  She denies that she has any routine or regular use of inhalers.  She has not had chest pain.  She has had cough but no documented fever.  No lower extremity swelling or calf pain. Past Medical History:  Diagnosis Date  . Acid reflux   . Arthritis   . Cancer ([place])    kidney  . Cataract   . Chiari malformation type I ([place])   . Complication of anesthesia    Hard To Awaken  . COPD (chronic obstructive pulmonary disease) ([place])   . Diverticulitis   . Headache   . Pneumonia   . Stroke ([place])   . Subclavian artery stenosis, left (HCC)    s/p angioplasty/stent 04/25/08    Patient Active Problem List   Diagnosis Date Noted  . COPD UNSPECIFIED 04/29/2009  . PULMONARY FIBROSIS ILD POST INFLAMMATORY CHRONIC 04/29/2009  . DYSLIPIDEMIA 03/10/2009  . 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 PRECORDIAL PAIN 02/04/2009  . TOBACCO ABUSE 01/23/2009  . DEPRESSION 01/23/2009    Past Surgical History:  Procedure Laterality Date  . ABDOMINAL HYSTERECTOMY     partial  . BREAST SURGERY     left lumpectomy  . carotidendarterectomy    . COLON  SURGERY    . EYE SURGERY    . INNER EAR SURGERY     right  . LUMBAR LAMINECTOMY/DECOMPRESSION MICRODISCECTOMY N/A 08/19/2016   Procedure: LUMBAR 4-5 DECOMPRESSION;  Surgeon: Phylliss Bob, MD;  Location: [facility];  Service: Orthopedics;  Laterality: N/A;  LUMBAR 4-5 DECOMPRESSION REQUESTED TIME 2.5 HRS   . NEPHRECTOMY     left  . PARTIAL COLECTOMY    . RHINOPLASTY    . TONSILLECTOMY    . TUBAL LIGATION       OB History   None      Home Medications    Prior to Admission medications   Medication Sig Start Date End Date Taking? Authorizing Provider  aspirin EC 325 MG tablet Take 325 mg by mouth daily.   Yes [provider]  CREON 24000-76000 units CPEP TK 1 C PO TID WITH MEALS AND WITH EVENING SNACKS 06/15/17  Yes [provider]  Melatonin 3 MG TABS Take 3 mg by mouth at bedtime.   Yes [provider]  omeprazole (PRILOSEC) 40 MG capsule TK ONE C PO QD 06/02/17  Yes [provider]  promethazine (PHENERGAN) 25 MG tablet TK 1/2 T PO BID PRF NAUSEA 05/13/17  Yes [provider]  traMADol (ULTRAM) 50 MG tablet Take 50 mg by mouth every 6 (six)  hours as needed for severe pain.   Yes [provider]  diphenhydrAMINE (BENADRYL) 25 MG tablet Take 25 mg by mouth 2 (two) times daily as needed (allergic reactions).    [provider]  docusate sodium (COLACE) 100 MG capsule Take 100 mg by mouth as needed for mild constipation.    [provider]    Family History Family History  Problem Relation Age of Onset  . Lung cancer Father     Social History Social History   Tobacco Use  . Smoking status: Current Every Day Smoker    Packs/day: 1.00    Types: Cigarettes  . Smokeless tobacco: Never Used  Substance Use Topics  . Alcohol use: No  . Drug use: No     Allergies   Iohexol; Sodium hypochlorite; Penicillins; Hydrocodone; and Tape   Review of Systems Review of Systems 10 Systems reviewed and are negative  for acute change except as noted in the HPI.   Physical Exam Updated Vital Signs BP 125/76   Pulse 84   Temp (!) 97.4 F (36.3 C) (Oral)   Resp (!) 26   Ht 5\' 2"  (1.575 m)   Wt 53.4 kg (117 lb 12.8 oz)   SpO2 93%   BMI 21.55 kg/m   Physical Exam  Constitutional: She is oriented to person, place, and time. She appears well-developed and well-nourished.  HENT:  Head: Normocephalic and atraumatic.  Mouth/Throat: Oropharynx is clear and moist.  Eyes: Pupils are equal, round, and reactive to light. EOM are normal.  Neck: Neck supple.  Cardiovascular: Normal rate, regular rhythm, normal heart sounds and intact distal pulses.  Pulmonary/Chest: Effort normal.  Bilateral coarse expiratory wheeze.  Fair airflow to the bases.  More diminished on the right than left.  Abdominal: Soft. Bowel sounds are normal. She exhibits no distension. There is no tenderness.  Musculoskeletal: Normal range of motion. She exhibits no edema or tenderness.  Neurological: She is alert and oriented to person, place, and time. She has normal strength. Coordination normal. GCS eye subscore is 4. GCS verbal subscore is 5. GCS motor subscore is 6.  Skin: Skin is warm, dry and intact.  Psychiatric: She has a normal mood and affect.     ED Treatments / Results  Labs (all labs ordered are listed, but only abnormal results are displayed) Labs Reviewed  CBC WITH DIFFERENTIAL/PLATELET - Abnormal; Notable for the following components:      Result Value   RBC 5.15 (*)    Hemoglobin 15.3 (*)    HCT 47.2 (*)    All other components within normal limits  CULTURE, BLOOD (ROUTINE X 2)  CULTURE, BLOOD (ROUTINE X 2)  BASIC METABOLIC PANEL  BRAIN NATRIURETIC PEPTIDE  I-STAT TROPONIN, ED    EKG EKG Interpretation  Date/Time:  Sunday June 26 2017 13:26:14 EDT Ventricular Rate:  84 PR Interval:    QRS Duration: 74 QT Interval:  367 QTC Calculation: 434 R Axis:   66 Text Interpretation:  Sinus rhythm Consider  left ventricular hypertrophy Anterior Q waves, possibly due to LVH no change from previous Confirmed by Charlesetta Shanks [phone]) on 06/26/2017 3:52:17 PM   Radiology Dg Chest 2 View  Result Date: 06/26/2017 CLINICAL DATA:  Pt c/o SOB x2-3 weeks. Hx of COPD, PNA. Smokes 1 ppd. EXAM: CHEST - 2 VIEW COMPARISON:  03/05/2015 FINDINGS: Cardiac silhouette is normal in size. No mediastinal or hilar masses. No evidence of adenopathy. Lungs are mildly hyperexpanded. There are reticular and linear  lung base opacities, greater on the right, similar to the prior study. It intervening hazy airspace opacity is noted in the posterolateral right lower lobe, less confluent than noted on the prior exam. Remainder of the lungs is clear. No pleural effusion or pneumothorax. Skeletal structures are intact. IMPRESSION: 1. No convincing acute cardiopulmonary disease. 2. Right greater than left lung base opacities most likely due to interstitial fibrosis. Electronically Signed   By: Lajean Manes M.D.   On: 06/26/2017 15:32    Procedures Procedures (including critical care time) CRITICAL CARE Performed by: Charlesetta Shanks   Total critical care time: 30  minutes  Critical care time was exclusive of separately billable procedures and treating other patients.  Critical care was necessary to treat or prevent imminent or life-threatening deterioration.  Critical care was time spent personally by me on the following activities: development of treatment plan with patient and/or surrogate as well as nursing, discussions with consultants, evaluation of patient's response to treatment, examination of patient, obtaining history from patient or surrogate, ordering and performing treatments and interventions, ordering and review of laboratory studies, ordering and review of radiographic studies, pulse oximetry and re-evaluation of patient's condition. Medications Ordered in ED Medications  levofloxacin (LEVAQUIN) IVPB 500 mg (has no  administration in time range)  albuterol (PROVENTIL) (2.5 MG/3ML) 0.083% nebulizer solution 5 mg (5 mg Nebulization Given 06/26/17 1332)  ipratropium-albuterol (DUONEB) 0.5-2.5 (3) MG/3ML nebulizer solution 3 mL (3 mLs Nebulization Given 06/26/17 1512)  methylPREDNISolone sodium succinate (SOLU-MEDROL) 125 mg/2 mL injection 125 mg (125 mg Intravenous Given 06/26/17 1512)     Initial Impression / Assessment and Plan / ED Course  I have reviewed the triage vital signs and the nursing notes.  Pertinent labs & imaging results that were available during my care of the patient were reviewed by me and considered in my medical decision making (see chart for details).      Final Clinical Impressions(s) / ED Diagnoses   Final diagnoses:  COPD exacerbation ([place])  Community acquired pneumonia, unspecified laterality  Hypoxia   Patient has had 2 weeks of increasing cough and shortness of breath.  Endings are most suggestive of COPD exacerbation.  Patient is hypoxic.  She has diffuse wheeze.  She has very strong smoking history.  Also, x-ray does make note of an area of infiltrate.  Given patient's history of increased cough in conjunction with progressive dyspnea will opt to treat for community-acquired pneumonia.  For COPD initiated with DuoNeb therapy and Solu-Medrol.  Plan for admission for hypoxia with COPD exacerbation and suspected pneumonia. ED Discharge Orders    None       Charlesetta Shanks, MD 06/26/17 [phone]

## 2017-06-26 NOTE — H&P (Signed)
History and Physical    Wanda James MVH:[id number] DOB: 1955-01-27 DOA: 06/26/2017  PCP: Gaye Alken, PA-C  Patient coming from: Home  Chief Complaint: SOB   HPI: Wanda James is a 62 y.o. female with medical history significant of COPD, tobacco history 1ppd for 40 years, diverticulitis who presents with 2 week history of SOB. She states that her home has been under renovation and since then she has had worsening SOB, dry cough, feeling of air hunger. No fevers, chills, chest pain. No new bowel changes.     ED Course: Patient with SOB, wheezes on exam. CXR without acute cardiopulmonary disease. Patient given breathing tx, levaquin, solumedrol.   Review of Systems: As per HPI otherwise 10 point review of systems negative.   Past Medical History:  Diagnosis Date  . Acid reflux   . Arthritis   . Cancer ([place])    kidney  . Cataract   . Chiari malformation type I ([place])   . Complication of anesthesia    Hard To Awaken  . COPD (chronic obstructive pulmonary disease) ([place])   . Diverticulitis   . Headache   . Pneumonia   . Stroke ([place])   . Subclavian artery stenosis, left (HCC)    s/p angioplasty/stent 04/25/08    Past Surgical History:  Procedure Laterality Date  . ABDOMINAL HYSTERECTOMY     partial  . BREAST SURGERY     left lumpectomy  . carotidendarterectomy    . COLON SURGERY    . EYE SURGERY    . INNER EAR SURGERY     right  . LUMBAR LAMINECTOMY/DECOMPRESSION MICRODISCECTOMY N/A 08/19/2016   Procedure: LUMBAR 4-5 DECOMPRESSION;  Surgeon: Phylliss Bob, MD;  Location: [facility];  Service: Orthopedics;  Laterality: N/A;  LUMBAR 4-5 DECOMPRESSION REQUESTED TIME 2.5 HRS   . NEPHRECTOMY     left  . PARTIAL COLECTOMY    . RHINOPLASTY    . TONSILLECTOMY    . TUBAL LIGATION       reports that she has been smoking cigarettes.  She has been smoking about 1.00 pack per day. She has never used smokeless tobacco. She reports that she does not drink alcohol or use  drugs.  Allergies  Allergen Reactions  . Iohexol Anaphylaxis     Desc: ANAPHALAXIS-ARS ON 08-22-04 Went into a coma  . Sodium Hypochlorite Other (See Comments)    Solution used to place on packing burned very bad, patient states she can't use it any more due to this/thjcma  . Penicillins Hives and Swelling    Has patient had a PCN reaction causing immediate rash, facial/tongue/throat swelling, SOB or lightheadedness with hypotension: No Has patient had a PCN reaction causing severe rash involving mucus membranes or skin necrosis: Yes Has patient had a PCN reaction that required hospitalization: No Has patient had a PCN reaction occurring within the last 10 years: No If all of the above answers are "NO", then may proceed with Cephalosporin use.   Marland Kitchen Hydrocodone Nausea And Vomiting  . Tape Rash    Itchy rash    Family History  Problem Relation Age of Onset  . Lung cancer Father     Prior to Admission medications   Medication Sig Start Date End Date Taking? Authorizing Provider  aspirin EC 325 MG tablet Take 325 mg by mouth daily.   Yes [provider]  CREON 24000-76000 units CPEP TK 1 C PO TID WITH MEALS AND WITH EVENING SNACKS 06/15/17  Yes [provider]  Melatonin 3 MG TABS Take 3 mg by mouth at bedtime.   Yes [provider]  omeprazole (PRILOSEC) 40 MG capsule TK ONE C PO QD 06/02/17  Yes [provider]  promethazine (PHENERGAN) 25 MG tablet TK 1/2 T PO BID PRF NAUSEA 05/13/17  Yes [provider]  traMADol (ULTRAM) 50 MG tablet Take 50 mg by mouth every 6 (six) hours as needed for severe pain.   Yes [provider]  diphenhydrAMINE (BENADRYL) 25 MG tablet Take 25 mg by mouth 2 (two) times daily as needed (allergic reactions).    [provider]  docusate sodium (COLACE) 100 MG capsule Take 100 mg by mouth as needed for mild constipation.    [provider]    Physical Exam: Vitals:   06/26/17 1500  06/26/17 1520 06/26/17 1540 06/26/17 1600  BP: (!) 142/94 (!) 142/114 125/76 130/79  Pulse: 81 77 84 88  Resp: (!) 21 (!) 21 (!) 26 (!) 26  Temp:      TempSrc:      SpO2: 95% 100% 93% 95%  Weight:      Height:        Constitutional: NAD, calm, comfortable Eyes: PERRL, lids and conjunctivae normal ENMT: Mucous membranes are moist. Posterior pharynx clear of any exudate or lesions.Normal dentition.  Neck: normal, supple, no masses, no thyromegaly Respiratory: bilateral wheezes, no conversational dyspnea or acute respiratory distress, chest appears hyperinflated Cardiovascular: Regular rate and rhythm, no murmurs / rubs / gallops. No extremity edema.  Abdomen: no tenderness, no masses palpated. No hepatosplenomegaly. Bowel sounds positive.  Musculoskeletal: no clubbing / cyanosis. No joint deformity upper and lower extremities. Good ROM, no contractures. Normal muscle tone.  Skin: no rashes, lesions, ulcers. No induration Neurologic: CN 2-12 grossly intact. Strength 5/5 in all 4.  Psychiatric: Normal judgment and insight. Alert and oriented x 3. Normal mood.   Labs on Admission: I have personally reviewed following labs and imaging studies  CBC: Recent Labs  Lab 06/26/17 1506  WBC 5.5  NEUTROABS 3.6  HGB 15.3*  HCT 47.2*  MCV 91.7  PLT 850   Basic Metabolic Panel: No results for input(s): NA, K, CL, CO2, GLUCOSE, BUN, CREATININE, CALCIUM, MG, PHOS in the last 168 hours. GFR: CrCl cannot be calculated (Patient's most recent lab result is older than the maximum 21 days allowed.). Liver Function Tests: No results for input(s): AST, ALT, ALKPHOS, BILITOT, PROT, ALBUMIN in the last 168 hours. No results for input(s): LIPASE, AMYLASE in the last 168 hours. No results for input(s): AMMONIA in the last 168 hours. Coagulation Profile: No results for input(s): INR, PROTIME in the last 168 hours. Cardiac Enzymes: No results for input(s): CKTOTAL, CKMB, CKMBINDEX, TROPONINI in the  last 168 hours. BNP (last 3 results) No results for input(s): PROBNP in the last 8760 hours. HbA1C: No results for input(s): HGBA1C in the last 72 hours. CBG: No results for input(s): GLUCAP in the last 168 hours. Lipid Profile: No results for input(s): CHOL, HDL, LDLCALC, TRIG, CHOLHDL, LDLDIRECT in the last 72 hours. Thyroid Function Tests: No results for input(s): TSH, T4TOTAL, FREET4, T3FREE, THYROIDAB in the last 72 hours. Anemia Panel: No results for input(s): VITAMINB12, FOLATE, FERRITIN, TIBC, IRON, RETICCTPCT in the last 72 hours. Urine analysis:    Component Value Date/Time   COLORURINE YELLOW 08/16/2016 [place] 08/16/2016 1441   LABSPEC 1.021 08/16/2016 1441   PHURINE 5.0 08/16/2016 1441   GLUCOSEU NEGATIVE 08/16/2016 1441  HGBUR MODERATE (A) 08/16/2016 1441   BILIRUBINUR NEGATIVE 08/16/2016 1441   KETONESUR NEGATIVE 08/16/2016 1441   PROTEINUR 100 (A) 08/16/2016 1441   UROBILINOGEN 0.2 05/29/2008 1030   NITRITE NEGATIVE 08/16/2016 1441   LEUKOCYTESUR TRACE (A) 08/16/2016 1441   Sepsis Labs: !!!!!!!!!!!!!!!!!!!!!!!!!!!!!!!!!!!!!!!!!!!! @LABRCNTIP (procalcitonin:4,lacticidven:4) )No results found for this or any previous visit (from the past 240 hour(s)).   Radiological Exams on Admission: Dg Chest 2 View  Result Date: 06/26/2017 CLINICAL DATA:  Pt c/o SOB x2-3 weeks. Hx of COPD, PNA. Smokes 1 ppd. EXAM: CHEST - 2 VIEW COMPARISON:  03/05/2015 FINDINGS: Cardiac silhouette is normal in size. No mediastinal or hilar masses. No evidence of adenopathy. Lungs are mildly hyperexpanded. There are reticular and linear lung base opacities, greater on the right, similar to the prior study. It intervening hazy airspace opacity is noted in the posterolateral right lower lobe, less confluent than noted on the prior exam. Remainder of the lungs is clear. No pleural effusion or pneumothorax. Skeletal structures are intact. IMPRESSION: 1. No convincing acute  cardiopulmonary disease. 2. Right greater than left lung base opacities most likely due to interstitial fibrosis. Electronically Signed   By: Lajean Manes M.D.   On: 06/26/2017 15:32    EKG: Independently reviewed. Normal sinus rhythm  Assessment/Plan Principal Problem:   COPD exacerbation (HCC) Active Problems:   Acute hypoxemic respiratory failure (HCC)   GERD (gastroesophageal reflux disease)   Tobacco abuse   COPD exacerbation -Solumedrol, doxycycline, nebs  Acute hypoxemic respiratory failure -Currently on 3L  O2, wean as able   GERD -Continue PPI   Tobacco abuse -Cessation counseling    DVT prophylaxis: Lovenox Code Status: Full  Family Communication: At bedside Disposition Plan: Pending clinical improvement Consults called: None  Admission status: Inpatient   * I certify that at the point of admission it is my clinical judgment that the patient will require inpatient hospital care spanning beyond 2 midnights from the point of admission due to high intensity of service, high risk for further deterioration and high frequency of surveillance required.*   Dessa Phi, DO [facility] [email or web address] Password [facility] 06/26/2017, 4:46 PM

## 2017-06-26 NOTE — ED Notes (Signed)
ED TO INPATIENT HANDOFF REPORT  Name/Age/Gender Wanda James 62 y.o. female  Code Status   Home/SNF/Other Home  Chief Complaint SOB  Level of Care/Admitting Diagnosis ED Disposition    ED Disposition Condition [facility] Area: [facility] [[id number]]  Level of Care: Med-Surg [16]  Diagnosis: COPD exacerbation [facility]) [[id number]]  Admitting Physician: Dessa Phi [id number]  Attending Physician: Dessa Phi [phone]  Estimated length of stay: past midnight tomorrow  Certification:: I certify this patient will need inpatient services for at least 2 midnights  PT Class (Do Not Modify): Inpatient [101]  PT Acc Code (Do Not Modify): Private [1]       Medical History Past Medical History:  Diagnosis Date  . Acid reflux   . Arthritis   . Cancer ([place])    kidney  . Cataract   . Chiari malformation type I ([place])   . Complication of anesthesia    Hard To Awaken  . COPD (chronic obstructive pulmonary disease) ([place])   . Diverticulitis   . Headache   . Pneumonia   . Stroke ([place])   . Subclavian artery stenosis, left (HCC)    s/p angioplasty/stent 04/25/08    Allergies Allergies  Allergen Reactions  . Iohexol Anaphylaxis     Desc: ANAPHALAXIS-ARS ON 08-22-04 Went into a coma  . Sodium Hypochlorite Other (See Comments)    Solution used to place on packing burned very bad, patient states she can't use it any more due to this/thjcma  . Penicillins Hives and Swelling    Has patient had a PCN reaction causing immediate rash, facial/tongue/throat swelling, SOB or lightheadedness with hypotension: No Has patient had a PCN reaction causing severe rash involving mucus membranes or skin necrosis: Yes Has patient had a PCN reaction that required hospitalization: No Has patient had a PCN reaction occurring within the last 10 years: No If all of the above answers are "NO", then may proceed with Cephalosporin use.   Marland Kitchen Hydrocodone Nausea And Vomiting   . Tape Rash    Itchy rash    IV Location/Drains/Wounds Patient Lines/Drains/Airways Status   Active Line/Drains/Airways    Name:   Placement date:   Placement time:   Site:   Days:   Incision (Closed) 08/19/16 Back Other (Comment)   08/19/16    1621     311          Labs/Imaging Results for orders placed or performed during the hospital encounter of 06/26/17 (from the past 48 hour(s))  CBC with Differential     Status: Abnormal   Collection Time: 06/26/17  3:06 PM  Result Value Ref Range   WBC 5.5 4.0 - 10.5 K/uL   RBC 5.15 (H) 3.87 - 5.11 MIL/uL   Hemoglobin 15.3 (H) 12.0 - 15.0 g/dL   HCT 47.2 (H) 36.0 - 46.0 %   MCV 91.7 78.0 - 100.0 fL   MCH 29.7 26.0 - 34.0 pg   MCHC 32.4 30.0 - 36.0 g/dL   RDW 15.2 11.5 - 15.5 %   Platelets 288 150 - 400 K/uL   Neutrophils Relative % 64 %   Neutro Abs 3.6 1.7 - 7.7 K/uL   Lymphocytes Relative 23 %   Lymphs Abs 1.3 0.7 - 4.0 K/uL   Monocytes Relative 7 %   Monocytes Absolute 0.4 0.1 - 1.0 K/uL   Eosinophils Relative 5 %   Eosinophils Absolute 0.3 0.0 - 0.7 K/uL   Basophils Relative 1 %  Basophils Absolute 0.1 0.0 - 0.1 K/uL    Comment: Performed at [facility], [place] [address], [place], [place] [address]  I-stat troponin, ED     Status: None   Collection Time: 06/26/17  3:11 PM  Result Value Ref Range   Troponin i, poc 0.00 0.00 - 0.08 ng/mL   Comment 3            Comment: Due to the release kinetics of cTnI, a negative result within the first hours of the onset of symptoms does not rule out myocardial infarction with certainty. If myocardial infarction is still suspected, repeat the test at appropriate intervals.    Dg Chest 2 View  Result Date: 06/26/2017 CLINICAL DATA:  Pt c/o SOB x2-3 weeks. Hx of COPD, PNA. Smokes 1 ppd. EXAM: CHEST - 2 VIEW COMPARISON:  03/05/2015 FINDINGS: Cardiac silhouette is normal in size. No mediastinal or hilar masses. No evidence of adenopathy. Lungs are mildly  hyperexpanded. There are reticular and linear lung base opacities, greater on the right, similar to the prior study. It intervening hazy airspace opacity is noted in the posterolateral right lower lobe, less confluent than noted on the prior exam. Remainder of the lungs is clear. No pleural effusion or pneumothorax. Skeletal structures are intact. IMPRESSION: 1. No convincing acute cardiopulmonary disease. 2. Right greater than left lung base opacities most likely due to interstitial fibrosis. Electronically Signed   By: Lajean Manes M.D.   On: 06/26/2017 15:32    Pending Labs Unresulted Labs (From admission, onward)   Start     Ordered   06/26/17 1447  Culture, blood (routine x 2)  BLOOD CULTURE X 2,   STAT     06/26/17 1446   06/26/17 8016  Basic metabolic panel  Once,   STAT     06/26/17 1446   06/26/17 1446  Brain natriuretic peptide  Once,   STAT     06/26/17 1446      Vitals/Pain Today's Vitals   06/26/17 1500 06/26/17 1520 06/26/17 1540 06/26/17 1600  BP: (!) 142/94 (!) 142/114 125/76 130/79  Pulse: 81 77 84 88  Resp: (!) 21 (!) 21 (!) 26 (!) 26  Temp:      TempSrc:      SpO2: 95% 100% 93% 95%  Weight:      Height:      PainSc:        Isolation Precautions No active isolations  Medications Medications  levofloxacin (LEVAQUIN) IVPB 500 mg (500 mg Intravenous New Bag/Given 06/26/17 1557)  albuterol (PROVENTIL) (2.5 MG/3ML) 0.083% nebulizer solution 5 mg (5 mg Nebulization Given 06/26/17 1332)  ipratropium-albuterol (DUONEB) 0.5-2.5 (3) MG/3ML nebulizer solution 3 mL (3 mLs Nebulization Given 06/26/17 1512)  methylPREDNISolone sodium succinate (SOLU-MEDROL) 125 mg/2 mL injection 125 mg (125 mg Intravenous Given 06/26/17 1512)    Mobility walks

## 2017-06-27 DIAGNOSIS — Z8673 Personal history of transient ischemic attack (TIA), and cerebral infarction without residual deficits: Secondary | ICD-10-CM

## 2017-06-27 DIAGNOSIS — R739 Hyperglycemia, unspecified: Secondary | ICD-10-CM

## 2017-06-27 LAB — CBC
HEMATOCRIT: 43.3 % (ref 36.0–46.0)
HEMOGLOBIN: 13.7 g/dL (ref 12.0–15.0)
MCH: 28.8 pg (ref 26.0–34.0)
MCHC: 31.6 g/dL (ref 30.0–36.0)
MCV: 91 fL (ref 78.0–100.0)
Platelets: 306 10*3/uL (ref 150–400)
RBC: 4.76 MIL/uL (ref 3.87–5.11)
RDW: 15.2 % (ref 11.5–15.5)
WBC: 6.7 10*3/uL (ref 4.0–10.5)

## 2017-06-27 LAB — HIV ANTIBODY (ROUTINE TESTING W REFLEX): HIV Screen 4th Generation wRfx: NONREACTIVE

## 2017-06-27 LAB — BASIC METABOLIC PANEL
Anion gap: 13 (ref 5–15)
BUN: 20 mg/dL (ref 6–20)
CHLORIDE: 104 mmol/L (ref 101–111)
CO2: 24 mmol/L (ref 22–32)
Calcium: 9.1 mg/dL (ref 8.9–10.3)
Creatinine, Ser: 0.85 mg/dL (ref 0.44–1.00)
GFR calc Af Amer: >60 mL/min (ref >60)
GFR calc non Af Amer: >60 mL/min (ref >60)
Glucose, Bld: 173 mg/dL — ABNORMAL HIGH (ref 65–99)
POTASSIUM: 3.8 mmol/L (ref 3.5–5.1)
Sodium: 141 mmol/L (ref 135–145)

## 2017-06-27 LAB — RESPIRATORY PANEL BY PCR
ADENOVIRUS-RVPPCR: NOT DETECTED
Bordetella pertussis: NOT DETECTED
CHLAMYDOPHILA PNEUMONIAE-RVPPCR: NOT DETECTED
CORONAVIRUS 229E-RVPPCR: NOT DETECTED
CORONAVIRUS HKU1-RVPPCR: NOT DETECTED
CORONAVIRUS NL63-RVPPCR: NOT DETECTED
Coronavirus OC43: NOT DETECTED
Influenza A: NOT DETECTED
Influenza B: NOT DETECTED
MYCOPLASMA PNEUMONIAE-RVPPCR: NOT DETECTED
Metapneumovirus: NOT DETECTED
PARAINFLUENZA VIRUS 3-RVPPCR: NOT DETECTED
Parainfluenza Virus 1: NOT DETECTED
Parainfluenza Virus 2: NOT DETECTED
Parainfluenza Virus 4: NOT DETECTED
Respiratory Syncytial Virus: NOT DETECTED
Rhinovirus / Enterovirus: NOT DETECTED

## 2017-06-27 MED ORDER — IPRATROPIUM-ALBUTEROL 0.5-2.5 (3) MG/3ML IN SOLN
3.0000 mL | Freq: Three times a day (TID) | RESPIRATORY_TRACT | Status: DC
  Administered 2017-06-28 – 2017-07-01 (×10): 3 mL via RESPIRATORY_TRACT
  Filled 2017-06-27 (×10): qty 3

## 2017-06-27 MED ORDER — GUAIFENESIN ER 600 MG PO TB12
1200.0000 mg | ORAL_TABLET | Freq: Two times a day (BID) | ORAL | Status: DC
  Administered 2017-06-27 – 2017-06-28 (×4): 1200 mg via ORAL
  Filled 2017-06-27 (×4): qty 2

## 2017-06-27 NOTE — Progress Notes (Signed)
PROGRESS NOTE    Wanda James  EZM:[id number] DOB: 1955-01-23 DOA: 06/26/2017 PCP: Gaye Alken, PA-C   Brief Narrative:   Wanda James is a 62 y.o. female with medical history significant of COPD, tobacco history 1ppd for 40 years, diverticulitis who presents with 2 week history of SOB. She states that her home has been under renovation and since then she has had worsening SOB, dry cough, feeling of air hunger. No fevers, chills, chest pain. No new bowel changes.     ED Course: Patient with SOB, wheezes on exam. CXR without acute cardiopulmonary disease. Patient given breathing tx, levaquin, solumedrol.   Assessment & Plan:   Principal Problem:   COPD exacerbation ([place]) Active Problems:   Acute hypoxemic respiratory failure (HCC)   GERD (gastroesophageal reflux disease)   Tobacco abuse   Acute Respiratory Failure with Hypoxia -Chest x-ray on admission showed no convincing acute cardiopulmonary disease but did have right greater than left lung base opacities most likely due to interstitial fibrosis -Patient was hypoxic on room air and her primary care physician's office as O2 saturation was 88% -Patient was placed on supplemental oxygen via nasal cannula and will continue and wean as tolerated; 3 L currently and wean as tolerated -Continuous pulse oximetry and maintain O2 saturations greater than 92% -Continue with duo nebs 3 mL's nebulized every 6 scheduled as well as albuterol 2.5 mg nebs every 2 PRN for wheezing and shortness of breath -Continue with IV Solu-Medrol 80 mg every 6 scheduled -Check respiratory virus panel and placed on droplet precautions empirically for now -Repeat chest x-ray in a.m.  Acute COPD Exacerbation -CXR as above -Started guaifenesin 1200 mg p.o. twice daily and stop Mucinex DM -Continue with antibiotic coverage with doxycycline 100 mg every 12 hours.  Was given IV levofloxacin in the emergency room -Continue with duo nebs 3 mL's nebulized every  6 scheduled as well as albuterol 2.5 mg nebs every 2 PRN for wheezing and shortness of breath -Continue with IV Solu-Medrol 80 mg every 6 scheduled; given 125 mg of methylprednisolone in the emergency room -Started flutter valve and incentive spirometer -If not significantly improving will obtain a CT of the chest and consider pulmonary evaluation  GERD/Acid Reflux -Continue PPI with Pantoprazole 80 mg p.o. daily  Tobacco Abuse -Smoking Cessation Counseling given  Hx of Kidney Cancer with left Nephrectomy -Stable no current active issue -Patient's BUN/creatinine is currently stable at 20/0.85 -Continue to Monitor Renal Function carefully  History of Diverticulitis with Partial Colectomy -Continue with Creon p.o. 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 daily  Hyperglycemia -Patient's blood sugar on BMP range from 103-1 73 -Likely in the setting of IV steroid demargination -Check Hemoglobin A1c -Continue monitor Blood Sugar on BMP's slightly elevated will consider starting sensitive NovoLog sliding scale insulin   DVT prophylaxis: Enoxaparin 40 mg sq q24h Code Status: FULL CODE Family Communication: Discussed with Family at bedside  Disposition Plan: Remain Inpatient for current treatment and workup  Consultants:   None   Procedures: None  Antimicrobials:  Anti-infectives (From admission, onward)   Start     Dose/Rate Route Frequency Ordered Stop   06/26/17 2200  doxycycline (VIBRA-TABS) tablet 100 mg     100 mg Oral Every 12 hours 06/26/17 1753     06/26/17 1600  levofloxacin (LEVAQUIN) IVPB 500 mg  500 mg 100 mL/hr over 60 Minutes Intravenous  Once 06/26/17 1547 06/26/17 1702     Subjective: Seen and examined at bedside states that her house is being currently remodeled and states that she had the [facility]  primer on home wall which caused her irritation and causing worsening shortness of breath.  She complains of a dry cough air hunger and being short of breath today.  She does not wear oxygen per my discussion with her at home.  Denies chest pain, lightheadedness, dizziness.  No other concerns or complaints at this time  Objective: Vitals:   06/26/17 2001 06/26/17 2133 06/27/17 0134 06/27/17 0526  BP: 117/90   108/71  Pulse: 86   99  Resp: 20   20  Temp: 97.9 F (36.6 C)   97.7 F (36.5 C)  TempSrc: Oral   Oral  SpO2: 97% 96% 98% 96%  Weight:      Height:        Intake/Output Summary (Last 24 hours) at 06/27/2017 1601 Last data filed at 06/26/2017 1800 Gross per 24 hour  Intake 60 ml  Output -  Net 60 ml   Filed Weights   06/26/17 1319  Weight: 53.4 kg (117 lb 12.8 oz)   Examination: Physical Exam:  Constitutional: Thin Caucasian female in NAD and appears calm  Eyes: Lids and conjunctivae normal, sclerae anicteric  ENMT: External Ears, Nose appear normal. Grossly normal hearing.   Neck: Appears normal, supple, no cervical masses, normal ROM, no appreciable thyromegaly Respiratory: Diminished to auscultation bilaterally with some rhonchi and mild expiratory wheezing; No appreciable rales, crackles. Normal respiratory effort and patient is not tachypenic. No accessory muscle use but is wearing supplemental O2 via [place].  Cardiovascular: RRR, no murmurs / rubs / gallops. S1 and S2 auscultated. No extremity edema.  Abdomen: Soft, Tender to palpate, non-distended. Bowel sounds positive x4.  GU: Deferred. Musculoskeletal: No clubbing / cyanosis of digits/nails. No joint deformity upper and lower extremities.  Skin: No rashes, lesions, ulcers on a limited skin evaluation. No induration; Warm and dry.  Neurologic: CN 2-12 grossly intact with no focal deficits. Romberg sign and cerebellar reflexes not assessed.  Psychiatric: Normal judgment and insight. Alert and oriented x 3. Normal mood  and appropriate affect.   Data Reviewed: I have personally reviewed following labs and imaging studies  CBC: Recent Labs  Lab 06/26/17 1506 06/27/17 0600  WBC 5.5 6.7  NEUTROABS 3.6  --   HGB 15.3* 13.7  HCT 47.2* 43.3  MCV 91.7 91.0  PLT 288 093   Basic Metabolic Panel: Recent Labs  Lab 06/26/17 1506 06/27/17 0600  NA 143 141  K 4.0 3.8  CL 106 104  CO2 24 24  GLUCOSE 103* 173*  BUN 20 20  CREATININE 0.94 0.85  CALCIUM 9.0 9.1   GFR: Estimated Creatinine Clearance: 54.3 mL/min (by C-G formula based on SCr of 0.85 mg/dL). Liver Function Tests: No results for input(s): AST, ALT, ALKPHOS, BILITOT, PROT, ALBUMIN in the last 168 hours. No results for input(s): LIPASE, AMYLASE in the last 168 hours. No results for input(s): AMMONIA in the last 168 hours. Coagulation Profile: No results for input(s): INR, PROTIME in the last 168 hours. Cardiac Enzymes: No results for input(s): CKTOTAL, CKMB, CKMBINDEX, TROPONINI in the last 168 hours. BNP (last 3 results) No results for input(s): PROBNP in the last 8760 hours. HbA1C: No results for input(s): HGBA1C in the last 72 hours. CBG: No results for input(s): GLUCAP in the last  168 hours. Lipid Profile: No results for input(s): CHOL, HDL, LDLCALC, TRIG, CHOLHDL, LDLDIRECT in the last 72 hours. Thyroid Function Tests: No results for input(s): TSH, T4TOTAL, FREET4, T3FREE, THYROIDAB in the last 72 hours. Anemia Panel: No results for input(s): VITAMINB12, FOLATE, FERRITIN, TIBC, IRON, RETICCTPCT in the last 72 hours. Sepsis Labs: No results for input(s): PROCALCITON, LATICACIDVEN in the last 168 hours.  No results found for this or any previous visit (from the past 240 hour(s)).   Radiology Studies: Dg Chest 2 View  Result Date: 06/26/2017 CLINICAL DATA:  Pt c/o SOB x2-3 weeks. Hx of COPD, PNA. Smokes 1 ppd. EXAM: CHEST - 2 VIEW COMPARISON:  03/05/2015 FINDINGS: Cardiac silhouette is normal in size. No mediastinal or hilar  masses. No evidence of adenopathy. Lungs are mildly hyperexpanded. There are reticular and linear lung base opacities, greater on the right, similar to the prior study. It intervening hazy airspace opacity is noted in the posterolateral right lower lobe, less confluent than noted on the prior exam. Remainder of the lungs is clear. No pleural effusion or pneumothorax. Skeletal structures are intact. IMPRESSION: 1. No convincing acute cardiopulmonary disease. 2. Right greater than left lung base opacities most likely due to interstitial fibrosis. Electronically Signed   By: Lajean Manes M.D.   On: 06/26/2017 15:32   Scheduled Meds: . aspirin EC  325 mg Oral Daily  . doxycycline  100 mg Oral Q12H  . enoxaparin (LOVENOX) injection  40 mg Subcutaneous Q24H  . ipratropium-albuterol  3 mL Nebulization Q6H  . lipase/protease/amylase  24,000 Units Oral TID AC  . Melatonin  3 mg Oral QHS  . methylPREDNISolone (SOLU-MEDROL) injection  80 mg Intravenous Q6H  . pantoprazole  80 mg Oral Daily  . sodium chloride flush  3 mL Intravenous Q12H   Continuous Infusions: . sodium chloride      LOS: 1 day   Kerney Elbe, DO [facility] Pager [phone]  If 7PM-7AM, please contact night-coverage [email or web address] Password [facility] 06/27/2017, 8:29 AM

## 2017-06-28 ENCOUNTER — Inpatient Hospital Stay (HOSPITAL_COMMUNITY): Payer: BLUE CROSS/BLUE SHIELD

## 2017-06-28 DIAGNOSIS — J189 Pneumonia, unspecified organism: Secondary | ICD-10-CM

## 2017-06-28 DIAGNOSIS — D72829 Elevated white blood cell count, unspecified: Secondary | ICD-10-CM

## 2017-06-28 DIAGNOSIS — R51 Headache: Secondary | ICD-10-CM

## 2017-06-28 DIAGNOSIS — E119 Type 2 diabetes mellitus without complications: Secondary | ICD-10-CM

## 2017-06-28 DIAGNOSIS — J8410 Pulmonary fibrosis, unspecified: Secondary | ICD-10-CM

## 2017-06-28 DIAGNOSIS — R519 Headache, unspecified: Secondary | ICD-10-CM

## 2017-06-28 LAB — CBC WITH DIFFERENTIAL/PLATELET
BASOS ABS: 0 10*3/uL (ref 0.0–0.1)
BASOS PCT: 0 %
EOS ABS: 0 10*3/uL (ref 0.0–0.7)
Eosinophils Relative: 0 %
HCT: 42.5 % (ref 36.0–46.0)
HEMOGLOBIN: 13.7 g/dL (ref 12.0–15.0)
Lymphocytes Relative: 3 %
Lymphs Abs: 0.4 10*3/uL — ABNORMAL LOW (ref 0.7–4.0)
MCH: 29.2 pg (ref 26.0–34.0)
MCHC: 32.2 g/dL (ref 30.0–36.0)
MCV: 90.6 fL (ref 78.0–100.0)
MONOS PCT: 2 %
Monocytes Absolute: 0.3 10*3/uL (ref 0.1–1.0)
NEUTROS ABS: 12.3 10*3/uL — AB (ref 1.7–7.7)
NEUTROS PCT: 95 %
Platelets: 289 10*3/uL (ref 150–400)
RBC: 4.69 MIL/uL (ref 3.87–5.11)
RDW: 15.5 % (ref 11.5–15.5)
WBC: 13 10*3/uL — AB (ref 4.0–10.5)

## 2017-06-28 LAB — COMPREHENSIVE METABOLIC PANEL
ALBUMIN: 3.1 g/dL — AB (ref 3.5–5.0)
ALT: 19 U/L (ref 0–44)
ANION GAP: 10 (ref 5–15)
AST: 28 U/L (ref 15–41)
Alkaline Phosphatase: 144 U/L — ABNORMAL HIGH (ref 38–126)
BUN: 20 mg/dL (ref 8–23)
CHLORIDE: 103 mmol/L (ref 98–111)
CO2: 28 mmol/L (ref 22–32)
Calcium: 9.1 mg/dL (ref 8.9–10.3)
Creatinine, Ser: 0.69 mg/dL (ref 0.44–1.00)
Glucose, Bld: 132 mg/dL — ABNORMAL HIGH (ref 70–99)
Potassium: 3.8 mmol/L (ref 3.5–5.1)
SODIUM: 141 mmol/L (ref 135–145)
Total Bilirubin: 0.3 mg/dL (ref 0.3–1.2)
Total Protein: 6.6 g/dL (ref 6.5–8.1)

## 2017-06-28 LAB — PHOSPHORUS: PHOSPHORUS: 3.3 mg/dL (ref 2.5–4.6)

## 2017-06-28 LAB — HEMOGLOBIN A1C
HEMOGLOBIN A1C: 6.8 % — AB (ref 4.8–5.6)
MEAN PLASMA GLUCOSE: 148.46 mg/dL

## 2017-06-28 LAB — MAGNESIUM: MAGNESIUM: 1.7 mg/dL (ref 1.7–2.4)

## 2017-06-28 LAB — GLUCOSE, CAPILLARY
GLUCOSE-CAPILLARY: 115 mg/dL — AB (ref 70–99)
Glucose-Capillary: 165 mg/dL — ABNORMAL HIGH (ref 70–99)

## 2017-06-28 MED ORDER — INSULIN ASPART 100 UNIT/ML ~~LOC~~ SOLN: 0.0000 [IU] | Freq: Every day | SUBCUTANEOUS | Status: DC

## 2017-06-28 MED ORDER — LIVING WELL WITH DIABETES BOOK
Freq: Once | Status: DC
  Filled 2017-06-28 (×2): qty 1

## 2017-06-28 MED ORDER — METHYLPREDNISOLONE SODIUM SUCC 125 MG IJ SOLR
60.0000 mg | Freq: Three times a day (TID) | INTRAMUSCULAR | Status: DC
  Administered 2017-06-28 – 2017-07-01 (×8): 60 mg via INTRAVENOUS
  Filled 2017-06-28 (×9): qty 2

## 2017-06-28 MED ORDER — BUTALBITAL-APAP-CAFFEINE 50-325-40 MG PO TABS
1.0000 | ORAL_TABLET | Freq: Three times a day (TID) | ORAL | Status: DC | PRN
  Administered 2017-06-28 – 2017-06-30 (×4): 1 via ORAL
  Filled 2017-06-28 (×4): qty 1

## 2017-06-28 MED ORDER — LEVOFLOXACIN IN D5W 750 MG/150ML IV SOLN
750.0000 mg | INTRAVENOUS | Status: DC
  Administered 2017-06-28 – 2017-06-29 (×2): 750 mg via INTRAVENOUS
  Filled 2017-06-28 (×4): qty 150

## 2017-06-28 MED ORDER — INSULIN ASPART 100 UNIT/ML ~~LOC~~ SOLN
0.0000 [IU] | Freq: Three times a day (TID) | SUBCUTANEOUS | Status: DC
  Administered 2017-06-28: 2 [IU] via SUBCUTANEOUS
  Administered 2017-06-29: 1 [IU] via SUBCUTANEOUS
  Administered 2017-06-30: 2 [IU] via SUBCUTANEOUS

## 2017-06-28 NOTE — Progress Notes (Signed)
PROGRESS NOTE    Wanda James  BJY:[id number] DOB: Sep 27, 1955 DOA: 06/26/2017 PCP: Gaye Alken, PA-C   Brief Narrative:  Wanda James is a 62 y.o. female with medical history significant of COPD, tobacco history 1ppd for 40 years, diverticulitis who presents with 2 week history of SOB. She states that her home has been under renovation and since then she has had worsening SOB, dry cough, feeling of air hunger. No fevers, chills, chest pain. No new bowel changes. Admitted for acute respiratory failure with hypoxia and found to have a COPD exacerbation with concomitant pneumonia.  Assessment & Plan:   Principal Problem:   COPD exacerbation ([place]) Active Problems:   PULMONARY FIBROSIS ILD POST INFLAMMATORY CHRONIC   Acute hypoxemic respiratory failure (HCC)   GERD (gastroesophageal reflux disease)   Tobacco abuse   CAP (community acquired pneumonia)   New onset type 2 diabetes mellitus ([place])   Leukocytosis   Headache   Acute Respiratory Failure with Hypoxia COPD exacerbation and concomitant pneumonia; Improving -Chest x-ray on admission showed no convincing acute cardiopulmonary disease but did have right greater than left lung base opacities most likely due to interstitial fibrosis -Repeat chest x-ray this a.m. Showed Persistent right basilar infiltrate. The left lung remains clear. -Patient was hypoxic on room air and her primary care physician's office as O2 saturation was 88% -Patient was placed on supplemental oxygen via nasal cannula and will continue and wean as tolerated; Was on 3 L currently and weaning and now down to 1 Liter; Continue to Wean as tolerated. -Continuous pulse oximetry and maintain O2 saturations greater than 92% -Received Levofloxacin 500 mg IV in the ED and was change to doxycycline however will now DC doxycycline and change back to Levofloxacin -Continue with duo nebs 3 mL's nebulized every 6 scheduled as well as albuterol 2.5 mg nebs every 2 PRN for  wheezing and shortness of breath -Changed IV Solu-Medrol 80 mg every 6 scheduled to 60 mg IV q8h; continue with PPI -Checked respiratory virus panel and Negative So removed droplet precautions empirically for now -Repeat CXR in a.m.  Acute COPD Exacerbation -Admission CXR as above; Repeat CXR shows Right Basilar Infiltrate that is persistent  -Started guaifenesin 1200 mg p.o. twice daily and stop Mucinex DM -Continued with antibiotic coverage with doxycycline 100 mg every 12 hours but will change back to Levofloxacin.  Was given IV levofloxacin in the emergency room -Continue with duo nebs 3 mL's nebulized every 6 scheduled as well as albuterol 2.5 mg nebs every 2 PRN for wheezing and shortness of breath -Weaned IV Solu-Medrol 80 mg every 6 scheduled to 60 mg q8h; given 125 mg of methylprednisolone in the emergency room -Started flutter valve and incentive spirometer -If not significantly improving will obtain a CT of the chest and consider Pulmonary Evaluation  CAP -Present on admission -Prominent on chest x-ray this morning -Treatment as above -We will change antibiotics to IV Levaquin 750 mg every 24 for community acquired pneumonia  GERD/Acid Reflux -Continue PPI with Pantoprazole 80 mg p.o. daily  Tobacco Abuse -Smoking Cessation Counseling given  Hx of Kidney Cancer with left Nephrectomy -Stable no current active issue -Patient's BUN/creatinine is currently stable at 20/0.69 -Continue to Monitor Renal Function carefully  History of Diverticulitis with Partial Colectomy -Continue with Creon p.o. 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 daily  Hyperglycemia in the setting of Diabetes Mellitus Type 2 (New Diagnosis)  -Patient's blood sugar on BMP range from 103-173 -Was suspected the setting of IV steroid  demargination -Checked Hemoglobin A1c and was 6.8 -Will add Sensitive Novolog SSI AC/HS -Continue to Monitor CBG's -Will Consult Diabetes Education Coordinator   Leukocytosis -BC went from 6.7 is now 13.0 -Likely in the setting of IV steroid demargination -Continue to monitor for signs and symptoms of infection. -Repeat CBC in a.m.  Headache -Continue with acetaminophen 650 mg p.o. every 6 as needed and will add Fioricet 1 tab p.o. every 8 hours PRN for headache  DVT prophylaxis: Enoxaparin 40 mg sq q24h Code Status: FULL CODE Family Communication: Discussed with Family at bedside  Disposition Plan: Remain Inpatient for current treatment and workup  Consultants:   None   Procedures: None  Antimicrobials:  Anti-infectives (From admission, onward)   Start     Dose/Rate Route Frequency Ordered Stop   06/28/17 1100  levofloxacin (LEVAQUIN) IVPB 750 mg     750 mg 100 mL/hr over 90 Minutes Intravenous Every 24 hours 06/28/17 1027     06/26/17 2200  doxycycline (VIBRA-TABS) tablet 100 mg  Status:  Discontinued     100 mg Oral Every 12 hours 06/26/17 1753 06/28/17 1027   06/26/17 1600  levofloxacin (LEVAQUIN) IVPB 500 mg     500 mg 100 mL/hr over 60 Minutes Intravenous  Once 06/26/17 1547 06/26/17 1702     Subjective: Seen and examined at bedside states her breathing was slightly better but significantly.  No chest pain, lightheadedness but did complain of a headache.  No other complaints or concerns at this time.  Was surprised to find out that she had a pneumonia.  Objective: Vitals:   06/27/17 1949 06/27/17 1957 06/28/17 0448 06/28/17 0914  BP:  134/89 124/77   Pulse:  86 79 83  Resp:  15  17  Temp:  (!) 97.5 F (36.4 C) 97.8 F (36.6 C)   TempSrc:  Oral Oral   SpO2: 93% 92% 97% 94%  Weight:      Height:        Intake/Output Summary (Last 24 hours) at 06/28/2017 1219 Last data filed at 06/28/2017 0900 Gross per 24 hour  Intake 600 ml  Output -  Net 600 ml    Filed Weights   06/26/17 1319  Weight: 53.4 kg (117 lb 12.8 oz)   Examination: Physical Exam:  Constitutional: Thin Caucasian female is currently in no acute distress and appears calm and comfortable sitting in bed Eyes: Sclera anicteric.  Lids and conjunctive are normal. ENMT: External ears and nose appear normal.  Grossly normal hearing Neck: Appears supple with no JVD  Respiratory: Diminished to auscultation bilaterally with some rhonchi and mild expiratory wheezing still.  Worse on the right compared to left.  No appreciable rales or crackles.  Patient has a normal respiratory effort is not tachypneic but is wearing supplemental oxygen via nasal cannula and has been weaned down to 1 L Cardiovascular: Regular rate and rhythm with no appreciable murmurs, rubs, gallops.  No extremity edema noted Abdomen: Soft, slightly tender to palpate.  Bowel sounds present and is nondistended.  Has abdominal scars from prior surgeries. GU: Deferred Musculoskeletal: No contractures or cyanosis.  No joint deformities noted Skin: Skin is warm and dry with no appreciable rashes or lesions limited skin evaluation Neurologic: Cranial nerves II through XII grossly intact no appreciable focal deficits.  Romberg sign and cerebellar reflexes not  assessed Psychiatric: Normal mood and affect.  Intact judgment and insight.  Patient is awake and alert and oriented x3  Data Reviewed: I have personally reviewed following labs and imaging studies  CBC: Recent Labs  Lab 06/26/17 1506 06/27/17 0600 06/28/17 0559  WBC 5.5 6.7 13.0*  NEUTROABS 3.6  --  12.3*  HGB 15.3* 13.7 13.7  HCT 47.2* 43.3 42.5  MCV 91.7 91.0 90.6  PLT 288 306 734   Basic Metabolic Panel: Recent Labs  Lab 06/26/17 1506 06/27/17 0600 06/28/17 0559  NA 143 141 141  K 4.0 3.8 3.8  CL 106 104 103  CO2 24 24 28   GLUCOSE 103* 173* 132*  BUN 20 20 20   CREATININE 0.94 0.85 0.69  CALCIUM 9.0 9.1 9.1  MG  --   --  1.7  PHOS  --   --   3.3   GFR: Estimated Creatinine Clearance: 57.7 mL/min (by C-G formula based on SCr of 0.69 mg/dL). Liver Function Tests: Recent Labs  Lab 06/28/17 0559  AST 28  ALT 19  ALKPHOS 144*  BILITOT 0.3  PROT 6.6  ALBUMIN 3.1*   No results for input(s): LIPASE, AMYLASE in the last 168 hours. No results for input(s): AMMONIA in the last 168 hours. Coagulation Profile: No results for input(s): INR, PROTIME in the last 168 hours. Cardiac Enzymes: No results for input(s): CKTOTAL, CKMB, CKMBINDEX, TROPONINI in the last 168 hours. BNP (last 3 results) No results for input(s): PROBNP in the last 8760 hours. HbA1C: Recent Labs    06/28/17 0559  HGBA1C 6.8*   CBG: No results for input(s): GLUCAP in the last 168 hours. Lipid Profile: No results for input(s): CHOL, HDL, LDLCALC, TRIG, CHOLHDL, LDLDIRECT in the last 72 hours. Thyroid Function Tests: No results for input(s): TSH, T4TOTAL, FREET4, T3FREE, THYROIDAB in the last 72 hours. Anemia Panel: No results for input(s): VITAMINB12, FOLATE, FERRITIN, TIBC, IRON, RETICCTPCT in the last 72 hours. Sepsis Labs: No results for input(s): PROCALCITON, LATICACIDVEN in the last 168 hours.  Recent Results (from the past 240 hour(s))  Culture, blood (routine x 2)     Status: None (Preliminary result)   Collection Time: 06/26/17  3:06 PM  Result Value Ref Range Status   Specimen Description   Final    BLOOD RIGHT ANTECUBITAL Performed at [facility] [address] [place], [place] [address]    Special Requests   Final    BOTTLES DRAWN AEROBIC AND ANAEROBIC Blood Culture adequate volume Performed at [facility] [address] [place], [place] [address]    Culture   Final    NO GROWTH 2 DAYS Performed at [facility] [address], [place], [place] [address]    Report Status PENDING  Incomplete  Culture, blood (routine x 2)     Status: None (Preliminary result)   Collection Time:  06/26/17  3:06 PM  Result Value Ref Range Status   Specimen Description   Final    BLOOD LEFT ANTECUBITAL Performed at [facility] [address], [place], [place] [address]    Special Requests   Final    BOTTLES DRAWN AEROBIC AND ANAEROBIC Blood Culture adequate volume Performed at [facility] [address], [place], [place] [address]    Culture   Final    NO GROWTH 2 DAYS Performed at [facility] [address] [place], [place] [address]    Report Status PENDING  Incomplete  Respiratory Panel by PCR     Status: None   Collection Time: 06/27/17 11:07 AM  Result Value Ref Range Status   Adenovirus NOT DETECTED NOT DETECTED Final   Coronavirus 229E NOT DETECTED NOT DETECTED Final   Coronavirus HKU1 NOT DETECTED NOT DETECTED Final   Coronavirus NL63 NOT DETECTED NOT DETECTED Final   Coronavirus OC43 NOT DETECTED NOT DETECTED Final   Metapneumovirus NOT DETECTED NOT DETECTED Final   Rhinovirus / Enterovirus NOT DETECTED NOT DETECTED Final   Influenza A NOT DETECTED NOT DETECTED Final   Influenza B NOT DETECTED NOT DETECTED Final   Parainfluenza Virus 1 NOT DETECTED NOT DETECTED Final   Parainfluenza Virus 2 NOT DETECTED NOT DETECTED Final   Parainfluenza Virus 3 NOT DETECTED NOT DETECTED Final   Parainfluenza Virus 4 NOT DETECTED NOT DETECTED Final   Respiratory Syncytial Virus NOT DETECTED NOT DETECTED Final   Bordetella pertussis NOT DETECTED NOT DETECTED Final   Chlamydophila pneumoniae NOT DETECTED NOT DETECTED Final   Mycoplasma pneumoniae NOT DETECTED NOT DETECTED Final    Comment: Performed at [facility], [place] [address] [place], [place] [address]     Radiology Studies: Dg Chest 2 View  Result Date: 06/26/2017 CLINICAL DATA:  Pt c/o SOB x2-3 weeks. Hx of COPD, PNA. Smokes 1 ppd. EXAM: CHEST - 2 VIEW COMPARISON:  03/05/2015 FINDINGS: Cardiac silhouette is normal in size. No mediastinal or hilar masses. No evidence  of adenopathy. Lungs are mildly hyperexpanded. There are reticular and linear lung base opacities, greater on the right, similar to the prior study. It intervening hazy airspace opacity is noted in the posterolateral right lower lobe, less confluent than noted on the prior exam. Remainder of the lungs is clear. No pleural effusion or pneumothorax. Skeletal structures are intact. IMPRESSION: 1. No convincing acute cardiopulmonary disease. 2. Right greater than left lung base opacities most likely due to interstitial fibrosis. Electronically Signed   By: Lajean Manes M.D.   On: 06/26/2017 15:32   Dg Chest Port 1 View  Result Date: 06/28/2017 CLINICAL DATA:  Shortness of breath EXAM: PORTABLE CHEST 1 VIEW COMPARISON:  06/26/2017 FINDINGS: Cardiac shadow is stable. The lungs are well aerated bilaterally with persistent right basilar infiltrate. No focal effusion is seen. No bony abnormality is noted. IMPRESSION: Persistent right basilar infiltrate.  The left lung remains clear. Electronically Signed   By: Inez Catalina M.D.   On: 06/28/2017 07:52   Scheduled Meds: . aspirin EC  325 mg Oral Daily  . enoxaparin (LOVENOX) injection  40 mg Subcutaneous Q24H  . guaiFENesin  1,200 mg Oral BID  . insulin aspart  0-5 Units Subcutaneous QHS  . insulin aspart  0-9 Units Subcutaneous TID WC  . ipratropium-albuterol  3 mL Nebulization TID  . lipase/protease/amylase  24,000 Units Oral TID AC  . Melatonin  3 mg Oral QHS  . methylPREDNISolone (SOLU-MEDROL) injection  60 mg Intravenous Q8H  . pantoprazole  80 mg Oral Daily  . sodium chloride flush  3 mL Intravenous Q12H   Continuous Infusions: . sodium chloride    . levofloxacin (LEVAQUIN) IV      LOS: 2 days   Kerney Elbe, DO [facility] Pager [phone]  If 7PM-7AM, please contact night-coverage [email or web address] Password [facility] 06/28/2017, 12:19 PM

## 2017-06-28 NOTE — Progress Notes (Signed)
Respiratory Panel negative. May come off of precautions.

## 2017-06-28 NOTE — Progress Notes (Signed)
Inpatient Diabetes Program Recommendations  AACE/ADA: New Consensus Statement on Inpatient Glycemic Control (2015)  Target Ranges:  Prepandial:   less than 140 mg/dL      Peak postprandial:   less than 180 mg/dL (1-2 hours)      Critically ill patients:  140 - 180 mg/dL   Lab Results  Component Value Date   HGBA1C 6.8 (H) 06/28/2017    Review of Glycemic Control  Diabetes history: New-onset Outpatient Diabetes medications: None Current orders for Inpatient glycemic control: Novolog 0-9 units tidwc and hs  HgbA1C - 6.8%. Blood sugars adequately controlled.  Inpatient Diabetes Program Recommendations:     Agree with orders. Spoke with pt regarding new-onset DM. Ordered Living Well book and diabetes videos on pt ed channel.   Instructed pt to f/u with her PCP for diabetes management.  Pt states she stays away from sweets, although she drinks 2 16 oz Mountain Dews per day. Discussed switching to diet soda and pt states she will do this.   Will follow blood sugars closely.  Thank you. Lorenda Peck, RD, LDN, CDE Inpatient Diabetes Coordinator [phone]

## 2017-06-29 ENCOUNTER — Other Ambulatory Visit: Payer: Self-pay

## 2017-06-29 ENCOUNTER — Inpatient Hospital Stay (HOSPITAL_COMMUNITY): Payer: BLUE CROSS/BLUE SHIELD

## 2017-06-29 DIAGNOSIS — R51 Headache: Secondary | ICD-10-CM

## 2017-06-29 DIAGNOSIS — J9601 Acute respiratory failure with hypoxia: Secondary | ICD-10-CM

## 2017-06-29 DIAGNOSIS — J441 Chronic obstructive pulmonary disease with (acute) exacerbation: Principal | ICD-10-CM

## 2017-06-29 DIAGNOSIS — K219 Gastro-esophageal reflux disease without esophagitis: Secondary | ICD-10-CM

## 2017-06-29 DIAGNOSIS — E119 Type 2 diabetes mellitus without complications: Secondary | ICD-10-CM

## 2017-06-29 DIAGNOSIS — Z720 Tobacco use: Secondary | ICD-10-CM

## 2017-06-29 LAB — CBC WITH DIFFERENTIAL/PLATELET
Basophils Absolute: 0 10*3/uL (ref 0.0–0.1)
Basophils Relative: 0 %
EOS ABS: 0 10*3/uL (ref 0.0–0.7)
Eosinophils Relative: 0 %
HCT: 41.8 % (ref 36.0–46.0)
Hemoglobin: 13.4 g/dL (ref 12.0–15.0)
LYMPHS ABS: 0.3 10*3/uL — AB (ref 0.7–4.0)
LYMPHS PCT: 3 %
MCH: 29.1 pg (ref 26.0–34.0)
MCHC: 32.1 g/dL (ref 30.0–36.0)
MCV: 90.7 fL (ref 78.0–100.0)
Monocytes Absolute: 0.4 10*3/uL (ref 0.1–1.0)
Monocytes Relative: 3 %
Neutro Abs: 10.7 10*3/uL — ABNORMAL HIGH (ref 1.7–7.7)
Neutrophils Relative %: 94 %
Platelets: 296 10*3/uL (ref 150–400)
RBC: 4.61 MIL/uL (ref 3.87–5.11)
RDW: 15.3 % (ref 11.5–15.5)
WBC: 11.4 10*3/uL — ABNORMAL HIGH (ref 4.0–10.5)

## 2017-06-29 LAB — GLUCOSE, CAPILLARY
GLUCOSE-CAPILLARY: 100 mg/dL — AB (ref 70–99)
GLUCOSE-CAPILLARY: 126 mg/dL — AB (ref 70–99)
Glucose-Capillary: 133 mg/dL — ABNORMAL HIGH (ref 70–99)
Glucose-Capillary: 110 mg/dL — ABNORMAL HIGH (ref 70–99)

## 2017-06-29 LAB — COMPREHENSIVE METABOLIC PANEL
ALT: 18 U/L (ref 0–44)
AST: 25 U/L (ref 15–41)
Albumin: 2.9 g/dL — ABNORMAL LOW (ref 3.5–5.0)
Alkaline Phosphatase: 139 U/L — ABNORMAL HIGH (ref 38–126)
Anion gap: 9 (ref 5–15)
BUN: 22 mg/dL (ref 8–23)
CALCIUM: 9.1 mg/dL (ref 8.9–10.3)
CO2: 28 mmol/L (ref 22–32)
CREATININE: 0.68 mg/dL (ref 0.44–1.00)
Chloride: 104 mmol/L (ref 98–111)
GFR calc non Af Amer: >60 mL/min (ref >60)
GLUCOSE: 107 mg/dL — AB (ref 70–99)
Potassium: 3.9 mmol/L (ref 3.5–5.1)
SODIUM: 141 mmol/L (ref 135–145)
Total Bilirubin: 0.3 mg/dL (ref 0.3–1.2)
Total Protein: 6.3 g/dL — ABNORMAL LOW (ref 6.5–8.1)

## 2017-06-29 LAB — MAGNESIUM: Magnesium: 1.7 mg/dL (ref 1.7–2.4)

## 2017-06-29 LAB — PHOSPHORUS: Phosphorus: 3.1 mg/dL (ref 2.5–4.6)

## 2017-06-29 MED ORDER — METFORMIN HCL 500 MG PO TABS
500.0000 mg | ORAL_TABLET | Freq: Two times a day (BID) | ORAL | Status: DC
  Administered 2017-06-29 – 2017-07-01 (×4): 500 mg via ORAL
  Filled 2017-06-29 (×4): qty 1

## 2017-06-29 MED ORDER — PROMETHAZINE HCL 25 MG/ML IJ SOLN
12.5000 mg | Freq: Once | INTRAMUSCULAR | Status: AC
  Administered 2017-06-29: 12.5 mg via INTRAVENOUS
  Filled 2017-06-29: qty 1

## 2017-06-29 MED ORDER — DM-GUAIFENESIN ER 30-600 MG PO TB12
1.0000 | ORAL_TABLET | Freq: Two times a day (BID) | ORAL | Status: DC
  Administered 2017-06-29 – 2017-07-01 (×6): 1 via ORAL
  Filled 2017-06-29 (×5): qty 1

## 2017-06-29 MED ORDER — NICOTINE 21 MG/24HR TD PT24
21.0000 mg | MEDICATED_PATCH | Freq: Every day | TRANSDERMAL | Status: DC
  Administered 2017-06-29 – 2017-07-01 (×3): 21 mg via TRANSDERMAL
  Filled 2017-06-29 (×3): qty 1

## 2017-06-29 NOTE — Progress Notes (Signed)
Patient ambulated in hallway. O2 saturations 94% on 3L O2 via nasal cannula prior to ambulating. O2 saturations drop to 84% with complaints of difficulty with breathing with ambulation. Assisted back to room. O2 saturations increased back up to 92%-94%.

## 2017-06-29 NOTE — Progress Notes (Signed)
PROGRESS NOTE    Wanda James   JEH:[id number]  DOB: 26-Jun-1955  DOA: 06/26/2017 PCP: Gaye Alken, PA-C   Brief Narrative:  Wanda James is a 62 y.o.femalewith medical history significant ofCOPD, tobacco history 1ppd for 40 years, diverticulitis who presents with 2 week history of SOB. She states that her home has been under renovation and since then she has had worsening SOB, dry cough, feeling of air hunger. No fevers, chills, chest pain. No new bowel changes.Admitted for acute respiratory failure with hypoxia and found to have a COPD exacerbation with concomitant pneumonia.  Subjective: Srill has cough and dyspnea but she is better than yesterday.  ROS: no complaints of nausea, vomiting, constipation diarrhea, cough, dyspnea or dysuria. No other complaints.   Assessment & Plan:   Principal Problem:   COPD exacerbation / pneumonia with acute hypoxic resp failure - cont IV steroids and nebs along with Levaquin - wean O2 as able  Active Problems:     GERD (gastroesophageal reflux disease) - cont PPI    Tobacco abuse - smoking cessation done- she states Nicotine patches and gum have never helped and Chantix is too expensive    New onset type 2 diabetes mellitus  - Hemoglobin A1c and was 6.8 - will start Metformin and give diabetes teaching  Pancreatic insufficiency - cont Creon   headache  - not improved with Fiorecet or Ultram - due to cough- control cough with cough surpressants  DVT prophylaxis: Lovenox Code Status: Full code Family Communication: husband  Disposition Plan: home Consultants:    Procedures:    Antimicrobials:  Anti-infectives (From admission, onward)   Start     Dose/Rate Route Frequency Ordered Stop   06/28/17 1100  levofloxacin (LEVAQUIN) IVPB 750 mg     750 mg 100 mL/hr over 90 Minutes Intravenous Every 24 hours 06/28/17 1027     06/26/17 2200  doxycycline (VIBRA-TABS) tablet 100 mg  Status:  Discontinued     100 mg Oral Every  12 hours 06/26/17 1753 06/28/17 1027   06/26/17 1600  levofloxacin (LEVAQUIN) IVPB 500 mg     500 mg 100 mL/hr over 60 Minutes Intravenous  Once 06/26/17 1547 06/26/17 1702       Objective: Vitals:   06/29/17 0538 06/29/17 0742 06/29/17 1347 06/29/17 1502  BP: 120/77   119/81  Pulse: 79   86  Resp: 18   18  Temp: 97.8 F (36.6 C)   97.8 F (36.6 C)  TempSrc: Oral   Oral  SpO2: 97% 92% 95% 90%  Weight:      Height:        Intake/Output Summary (Last 24 hours) at 06/29/2017 1514 Last data filed at 06/29/2017 0300 Gross per 24 hour  Intake 270 ml  Output -  Net 270 ml   Filed Weights   06/26/17 1319  Weight: 53.4 kg (117 lb 12.8 oz)    Examination: General exam: Appears comfortable  HEENT: PERRLA, oral mucosa moist, no sclera icterus or thrush Respiratory system: b/l wheezing Cardiovascular system: S1 & S2 heard, RRR.   Gastrointestinal system: Abdomen soft, non-tender, nondistended. Normal bowel sound. No organomegaly Central nervous system: Alert and oriented. No focal neurological deficits. Extremities: No cyanosis, clubbing or edema Skin: No rashes or ulcers Psychiatry:  Mood & affect appropriate.     Data Reviewed: I have personally reviewed following labs and imaging studies  CBC: Recent Labs  Lab 06/26/17 1506 06/27/17 0600 06/28/17 0559 06/29/17 0511  WBC 5.5 6.7  13.0* 11.4*  NEUTROABS 3.6  --  12.3* 10.7*  HGB 15.3* 13.7 13.7 13.4  HCT 47.2* 43.3 42.5 41.8  MCV 91.7 91.0 90.6 90.7  PLT 288 306 289 993   Basic Metabolic Panel: Recent Labs  Lab 06/26/17 1506 06/27/17 0600 06/28/17 0559 06/29/17 0511  NA 143 141 141 141  K 4.0 3.8 3.8 3.9  CL 106 104 103 104  CO2 24 24 28 28   GLUCOSE 103* 173* 132* 107*  BUN 20 20 20 22   CREATININE 0.94 0.85 0.69 0.68  CALCIUM 9.0 9.1 9.1 9.1  MG  --   --  1.7 1.7  PHOS  --   --  3.3 3.1   GFR: Estimated Creatinine Clearance: 57.7 mL/min (by C-G formula based on SCr of 0.68 mg/dL). Liver Function  Tests: Recent Labs  Lab 06/28/17 0559 06/29/17 0511  AST 28 25  ALT 19 18  ALKPHOS 144* 139*  BILITOT 0.3 0.3  PROT 6.6 6.3*  ALBUMIN 3.1* 2.9*   No results for input(s): LIPASE, AMYLASE in the last 168 hours. No results for input(s): AMMONIA in the last 168 hours. Coagulation Profile: No results for input(s): INR, PROTIME in the last 168 hours. Cardiac Enzymes: No results for input(s): CKTOTAL, CKMB, CKMBINDEX, TROPONINI in the last 168 hours. BNP (last 3 results) No results for input(s): PROBNP in the last 8760 hours. HbA1C: Recent Labs    06/28/17 0559  HGBA1C 6.8*   CBG: Recent Labs  Lab 06/28/17 1739 06/28/17 2205 06/29/17 0808 06/29/17 1212  GLUCAP 165* 115* 133* 110*   Lipid Profile: No results for input(s): CHOL, HDL, LDLCALC, TRIG, CHOLHDL, LDLDIRECT in the last 72 hours. Thyroid Function Tests: No results for input(s): TSH, T4TOTAL, FREET4, T3FREE, THYROIDAB in the last 72 hours. Anemia Panel: No results for input(s): VITAMINB12, FOLATE, FERRITIN, TIBC, IRON, RETICCTPCT in the last 72 hours. Urine analysis:    Component Value Date/Time   COLORURINE YELLOW 08/16/2016 1441   APPEARANCEUR CLEAR 08/16/2016 1441   LABSPEC 1.021 08/16/2016 1441   PHURINE 5.0 08/16/2016 1441   GLUCOSEU NEGATIVE 08/16/2016 1441   HGBUR MODERATE (A) 08/16/2016 1441   BILIRUBINUR NEGATIVE 08/16/2016 1441   KETONESUR NEGATIVE 08/16/2016 1441   PROTEINUR 100 (A) 08/16/2016 1441   UROBILINOGEN 0.2 05/29/2008 1030   NITRITE NEGATIVE 08/16/2016 1441   LEUKOCYTESUR TRACE (A) 08/16/2016 1441   Sepsis Labs: @LABRCNTIP (procalcitonin:4,lacticidven:4) ) Recent Results (from the past 240 hour(s))  Culture, blood (routine x 2)     Status: None (Preliminary result)   Collection Time: 06/26/17  3:06 PM  Result Value Ref Range Status   Specimen Description   Final    BLOOD RIGHT ANTECUBITAL Performed at [facility], [place] [address] [place], [place] [address]     Special Requests   Final    BOTTLES DRAWN AEROBIC AND ANAEROBIC Blood Culture adequate volume Performed at [place] [address] [place], [place] [address]    Culture   Final    NO GROWTH 3 DAYS Performed at [facility], [place] [address], [place], [place] [address]    Report Status PENDING  Incomplete  Culture, blood (routine x 2)     Status: None (Preliminary result)   Collection Time: 06/26/17  3:06 PM  Result Value Ref Range Status   Specimen Description   Final    BLOOD LEFT ANTECUBITAL Performed at [place] [place] [address], [place], [place] [address]    Special Requests  Final    BOTTLES DRAWN AEROBIC AND ANAEROBIC Blood Culture adequate volume Performed at [place] [address], [place], [place] [address]    Culture   Final    NO GROWTH 3 DAYS Performed at [facility], [place] [address], [place], [place] [address]    Report Status PENDING  Incomplete  Respiratory Panel by PCR     Status: None   Collection Time: 06/27/17 11:07 AM  Result Value Ref Range Status   Adenovirus NOT DETECTED NOT DETECTED Final   Coronavirus 229E NOT DETECTED NOT DETECTED Final   Coronavirus HKU1 NOT DETECTED NOT DETECTED Final   Coronavirus NL63 NOT DETECTED NOT DETECTED Final   Coronavirus OC43 NOT DETECTED NOT DETECTED Final   Metapneumovirus NOT DETECTED NOT DETECTED Final   Rhinovirus / Enterovirus NOT DETECTED NOT DETECTED Final   Influenza A NOT DETECTED NOT DETECTED Final   Influenza B NOT DETECTED NOT DETECTED Final   Parainfluenza Virus 1 NOT DETECTED NOT DETECTED Final   Parainfluenza Virus 2 NOT DETECTED NOT DETECTED Final   Parainfluenza Virus 3 NOT DETECTED NOT DETECTED Final   Parainfluenza Virus 4 NOT DETECTED NOT DETECTED Final   Respiratory Syncytial Virus NOT DETECTED NOT DETECTED Final   Bordetella pertussis NOT DETECTED NOT DETECTED Final   Chlamydophila pneumoniae NOT DETECTED NOT  DETECTED Final   Mycoplasma pneumoniae NOT DETECTED NOT DETECTED Final    Comment: Performed at [facility], [place] [address], [place], [place] [address]         Radiology Studies: Dg Chest Port 1 View  Result Date: 06/29/2017 CLINICAL DATA:  Short of breath EXAM: PORTABLE CHEST 1 VIEW COMPARISON:  06/28/2017 FINDINGS: Normal heart size. Hyperaeration. Stable airspace disease at the right base. Persistent volume loss in the right lung with shift of the mediastinum to the right. No pneumothorax. No definite pleural effusion. IMPRESSION: Stable airspace disease at the right lung base with volume loss. Electronically Signed   By: Marybelle Killings M.D.   On: 06/29/2017 06:59   Dg Chest Port 1 View  Result Date: 06/28/2017 CLINICAL DATA:  Shortness of breath EXAM: PORTABLE CHEST 1 VIEW COMPARISON:  06/26/2017 FINDINGS: Cardiac shadow is stable. The lungs are well aerated bilaterally with persistent right basilar infiltrate. No focal effusion is seen. No bony abnormality is noted. IMPRESSION: Persistent right basilar infiltrate.  The left lung remains clear. Electronically Signed   By: Inez Catalina M.D.   On: 06/28/2017 07:52      Scheduled Meds: . aspirin EC  325 mg Oral Daily  . dextromethorphan-guaiFENesin  1 tablet Oral BID  . enoxaparin (LOVENOX) injection  40 mg Subcutaneous Q24H  . insulin aspart  0-5 Units Subcutaneous QHS  . insulin aspart  0-9 Units Subcutaneous TID WC  . ipratropium-albuterol  3 mL Nebulization TID  . lipase/protease/amylase  24,000 Units Oral TID AC  . living well with diabetes book   Does not apply Once  . Melatonin  3 mg Oral QHS  . methylPREDNISolone (SOLU-MEDROL) injection  60 mg Intravenous Q8H  . nicotine  21 mg Transdermal Daily  . pantoprazole  80 mg Oral Daily  . sodium chloride flush  3 mL Intravenous Q12H   Continuous Infusions: . sodium chloride    . levofloxacin (LEVAQUIN) IV Stopped (06/29/17 1418)     LOS: 3 days    Time spent in  minutes: 35    Debbe Odea, MD [facility] Pager: [email or web address] Password [facility] 06/29/2017,  3:14 PM

## 2017-06-30 DIAGNOSIS — D72829 Elevated white blood cell count, unspecified: Secondary | ICD-10-CM

## 2017-06-30 LAB — GLUCOSE, CAPILLARY
GLUCOSE-CAPILLARY: 100 mg/dL — AB (ref 70–99)
GLUCOSE-CAPILLARY: 108 mg/dL — AB (ref 70–99)
Glucose-Capillary: 144 mg/dL — ABNORMAL HIGH (ref 70–99)
Glucose-Capillary: 109 mg/dL — ABNORMAL HIGH (ref 70–99)

## 2017-06-30 MED ORDER — LEVOFLOXACIN 750 MG PO TABS
750.0000 mg | ORAL_TABLET | Freq: Every day | ORAL | Status: DC
  Administered 2017-06-30 – 2017-07-01 (×2): 750 mg via ORAL
  Filled 2017-06-30 (×2): qty 1

## 2017-06-30 NOTE — Progress Notes (Signed)
PROGRESS NOTE    Wanda James   KXF:[id number]  DOB: 12-07-1955  DOA: 06/26/2017 PCP: Gaye Alken, PA-C   Brief Narrative:  Wanda James is a 62 y.o.femalewith medical history significant ofCOPD, tobacco history 1ppd for 40 years, diverticulitis who presents with 2 week history of SOB. She states that her home has been under renovation and since then she has had worsening SOB, dry cough, feeling of air hunger. No fevers, chills, chest pain. No new bowel changes.Admitted for acute respiratory failure with hypoxia and found to have a COPD exacerbation with concomitant pneumonia.  Subjective: Quite short of breath when ambulating to the bathroom but feeling just a little bit better then yesterday. Per RN, pulse ox drops to 88 when moving on 3 L o2 ROS: no complaints of nausea, vomiting, constipation diarrhea, cough, dyspnea or dysuria. No other complaints.   Assessment & Plan:   Principal Problem:   COPD exacerbation / pneumonia with acute hypoxic resp failure - cont current dose of IV steroids and nebs along with Levaquin (which can be changed to oral today) - wean O2 as able- ambulate on O2 in the hall  Active Problems:     GERD (gastroesophageal reflux disease) - cont PPI    Tobacco abuse - smoking cessation done- she states Nicotine patches and gum have never helped and Chantix is too expensive    New onset type 2 diabetes mellitus  - Hemoglobin A1c and was 6.8 - will start Metformin and give diabetes teaching  Pancreatic insufficiency - cont Creon   headache  - not improved with Fiorecet or Ultram - due to cough- control cough with cough surpressants  DVT prophylaxis: Lovenox Code Status: Full code Family Communication: husband  Disposition Plan: home Consultants:    Procedures:    Antimicrobials:  Anti-infectives (From admission, onward)   Start     Dose/Rate Route Frequency Ordered Stop   06/30/17 1000  levofloxacin (LEVAQUIN) tablet 750 mg     750 mg Oral Daily 06/30/17 0913     06/28/17 1100  levofloxacin (LEVAQUIN) IVPB 750 mg  Status:  Discontinued     750 mg 100 mL/hr over 90 Minutes Intravenous Every 24 hours 06/28/17 1027 06/30/17 0913   06/26/17 2200  doxycycline (VIBRA-TABS) tablet 100 mg  Status:  Discontinued     100 mg Oral Every 12 hours 06/26/17 1753 06/28/17 1027   06/26/17 1600  levofloxacin (LEVAQUIN) IVPB 500 mg     500 mg 100 mL/hr over 60 Minutes Intravenous  Once 06/26/17 1547 06/26/17 1702       Objective: Vitals:   06/30/17 0448 06/30/17 0602 06/30/17 0800 06/30/17 0834  BP:  117/72    Pulse:  84    Resp:  18    Temp:  98 F (36.7 C)    TempSrc:  Oral    SpO2: 92% 97% 90% 91%  Weight:      Height:        Intake/Output Summary (Last 24 hours) at 06/30/2017 1027 Last data filed at 06/29/2017 2300 Gross per 24 hour  Intake 1130 ml  Output -  Net 1130 ml   Filed Weights   06/26/17 1319  Weight: 53.4 kg (117 lb 12.8 oz)    Examination: General exam: Appears comfortable  HEENT: PERRLA, oral mucosa moist, no sclera icterus or thrush Respiratory system: still has b/l weezing- 92% on 3 L at rest Cardiovascular system: S1 & S2 heard, RRR.   Gastrointestinal system: Abdomen soft, non-tender, nondistended.  Normal bowel sound. No organomegaly Central nervous system: Alert and oriented. No focal neurological deficits. Extremities: No cyanosis, clubbing or edema Skin: No rashes or ulcers Psychiatry:  Mood & affect appropriate.     Data Reviewed: I have personally reviewed following labs and imaging studies  CBC: Recent Labs  Lab 06/26/17 1506 06/27/17 0600 06/28/17 0559 06/29/17 0511  WBC 5.5 6.7 13.0* 11.4*  NEUTROABS 3.6  --  12.3* 10.7*  HGB 15.3* 13.7 13.7 13.4  HCT 47.2* 43.3 42.5 41.8  MCV 91.7 91.0 90.6 90.7  PLT 288 306 289 654   Basic Metabolic Panel: Recent Labs  Lab 06/26/17 1506 06/27/17 0600 06/28/17 0559 06/29/17 0511  NA 143 141 141 141  K 4.0 3.8 3.8 3.9  CL  106 104 103 104  CO2 24 24 28 28   GLUCOSE 103* 173* 132* 107*  BUN 20 20 20 22   CREATININE 0.94 0.85 0.69 0.68  CALCIUM 9.0 9.1 9.1 9.1  MG  --   --  1.7 1.7  PHOS  --   --  3.3 3.1   GFR: Estimated Creatinine Clearance: 57.7 mL/min (by C-G formula based on SCr of 0.68 mg/dL). Liver Function Tests: Recent Labs  Lab 06/28/17 0559 06/29/17 0511  AST 28 25  ALT 19 18  ALKPHOS 144* 139*  BILITOT 0.3 0.3  PROT 6.6 6.3*  ALBUMIN 3.1* 2.9*   No results for input(s): LIPASE, AMYLASE in the last 168 hours. No results for input(s): AMMONIA in the last 168 hours. Coagulation Profile: No results for input(s): INR, PROTIME in the last 168 hours. Cardiac Enzymes: No results for input(s): CKTOTAL, CKMB, CKMBINDEX, TROPONINI in the last 168 hours. BNP (last 3 results) No results for input(s): PROBNP in the last 8760 hours. HbA1C: Recent Labs    06/28/17 0559  HGBA1C 6.8*   CBG: Recent Labs  Lab 06/29/17 0808 06/29/17 1212 06/29/17 1657 06/29/17 2052 06/30/17 0756  GLUCAP 133* 110* 100* 126* 100*   Lipid Profile: No results for input(s): CHOL, HDL, LDLCALC, TRIG, CHOLHDL, LDLDIRECT in the last 72 hours. Thyroid Function Tests: No results for input(s): TSH, T4TOTAL, FREET4, T3FREE, THYROIDAB in the last 72 hours. Anemia Panel: No results for input(s): VITAMINB12, FOLATE, FERRITIN, TIBC, IRON, RETICCTPCT in the last 72 hours. Urine analysis:    Component Value Date/Time   COLORURINE YELLOW 08/16/2016 1441   APPEARANCEUR CLEAR 08/16/2016 1441   LABSPEC 1.021 08/16/2016 1441   PHURINE 5.0 08/16/2016 1441   GLUCOSEU NEGATIVE 08/16/2016 1441   HGBUR MODERATE (A) 08/16/2016 1441   BILIRUBINUR NEGATIVE 08/16/2016 1441   KETONESUR NEGATIVE 08/16/2016 1441   PROTEINUR 100 (A) 08/16/2016 1441   UROBILINOGEN 0.2 05/29/2008 1030   NITRITE NEGATIVE 08/16/2016 1441   LEUKOCYTESUR TRACE (A) 08/16/2016 1441   Sepsis Labs: @LABRCNTIP (procalcitonin:4,lacticidven:4) ) Recent  Results (from the past 240 hour(s))  Culture, blood (routine x 2)     Status: None (Preliminary result)   Collection Time: 06/26/17  3:06 PM  Result Value Ref Range Status   Specimen Description   Final    BLOOD RIGHT ANTECUBITAL Performed at [facility], [place] [address] [place], [place] [address]    Special Requests   Final    BOTTLES DRAWN AEROBIC AND ANAEROBIC Blood Culture adequate volume Performed at [place] [address] [place], [place] [address]    Culture   Final    NO GROWTH 4 DAYS Performed at [facility], [place] [address] [place], [place]  [address]    Report Status PENDING  Incomplete  Culture, blood (routine x 2)     Status: None (Preliminary result)   Collection Time: 06/26/17  3:06 PM  Result Value Ref Range Status   Specimen Description   Final    BLOOD LEFT ANTECUBITAL Performed at [place] [address] [place], [place] [address]    Special Requests   Final    BOTTLES DRAWN AEROBIC AND ANAEROBIC Blood Culture adequate volume Performed at [place] [address], [place], [place] [address]    Culture   Final    NO GROWTH 4 DAYS Performed at [facility], [place] [address] [place], [place] [address]    Report Status PENDING  Incomplete  Respiratory Panel by PCR     Status: None   Collection Time: 06/27/17 11:07 AM  Result Value Ref Range Status   Adenovirus NOT DETECTED NOT DETECTED Final   Coronavirus 229E NOT DETECTED NOT DETECTED Final   Coronavirus HKU1 NOT DETECTED NOT DETECTED Final   Coronavirus NL63 NOT DETECTED NOT DETECTED Final   Coronavirus OC43 NOT DETECTED NOT DETECTED Final   Metapneumovirus NOT DETECTED NOT DETECTED Final   Rhinovirus / Enterovirus NOT DETECTED NOT DETECTED Final   Influenza A NOT DETECTED NOT DETECTED Final   Influenza B NOT DETECTED NOT DETECTED Final   Parainfluenza Virus 1 NOT DETECTED NOT DETECTED Final    Parainfluenza Virus 2 NOT DETECTED NOT DETECTED Final   Parainfluenza Virus 3 NOT DETECTED NOT DETECTED Final   Parainfluenza Virus 4 NOT DETECTED NOT DETECTED Final   Respiratory Syncytial Virus NOT DETECTED NOT DETECTED Final   Bordetella pertussis NOT DETECTED NOT DETECTED Final   Chlamydophila pneumoniae NOT DETECTED NOT DETECTED Final   Mycoplasma pneumoniae NOT DETECTED NOT DETECTED Final    Comment: Performed at [facility], [place] [address] [place], [place] [address]         Radiology Studies: Dg Chest Port 1 View  Result Date: 06/29/2017 CLINICAL DATA:  Short of breath EXAM: PORTABLE CHEST 1 VIEW COMPARISON:  06/28/2017 FINDINGS: Normal heart size. Hyperaeration. Stable airspace disease at the right base. Persistent volume loss in the right lung with shift of the mediastinum to the right. No pneumothorax. No definite pleural effusion. IMPRESSION: Stable airspace disease at the right lung base with volume loss. Electronically Signed   By: Marybelle Killings M.D.   On: 06/29/2017 06:59      Scheduled Meds: . aspirin EC  325 mg Oral Daily  . dextromethorphan-guaiFENesin  1 tablet Oral BID  . enoxaparin (LOVENOX) injection  40 mg Subcutaneous Q24H  . insulin aspart  0-5 Units Subcutaneous QHS  . insulin aspart  0-9 Units Subcutaneous TID WC  . ipratropium-albuterol  3 mL Nebulization TID  . levofloxacin  750 mg Oral Daily  . lipase/protease/amylase  24,000 Units Oral TID AC  . living well with diabetes book   Does not apply Once  . Melatonin  3 mg Oral QHS  . metFORMIN  500 mg Oral BID WC  . methylPREDNISolone (SOLU-MEDROL) injection  60 mg Intravenous Q8H  . nicotine  21 mg Transdermal Daily  . pantoprazole  80 mg Oral Daily  . sodium chloride flush  3 mL Intravenous Q12H   Continuous Infusions: . sodium chloride       LOS: 4 days    Time spent in minutes: 35    Debbe Odea, MD [facility] Pager: [email or web address] Password  [email or web address] 06/30/2017, 10:27 AM

## 2017-06-30 NOTE — Progress Notes (Signed)
SATURATION QUALIFICATIONS: (This note is used to comply with regulatory documentation for home oxygen)  Patient Saturations on Room Air at Rest = 88%  Patient Saturations on Room Air while Ambulating = 86%  Patient Saturations on 2L Liters of oxygen while Ambulating = 88%  Patient Saturations on 3L Liters of oxygen while Ambulating = 88 -89%  Patient Saturations on 4L Liters of oxygen while Ambulating = 90-92%  Please briefly explain why patient needs home oxygen:

## 2017-06-30 NOTE — Progress Notes (Signed)
Received report from [employer]. Assessment unchanged from previous assessment. Will continue to monitor.

## 2017-07-01 DIAGNOSIS — J181 Lobar pneumonia, unspecified organism: Secondary | ICD-10-CM

## 2017-07-01 LAB — CULTURE, BLOOD (ROUTINE X 2)
Culture: NO GROWTH
Culture: NO GROWTH
SPECIAL REQUESTS: ADEQUATE
Special Requests: ADEQUATE

## 2017-07-01 LAB — GLUCOSE, CAPILLARY
GLUCOSE-CAPILLARY: 95 mg/dL (ref 70–99)
Glucose-Capillary: 89 mg/dL (ref 70–99)

## 2017-07-01 MED ORDER — DM-GUAIFENESIN ER 30-600 MG PO TB12: 1.0000 | ORAL_TABLET | Freq: Two times a day (BID) | ORAL | 0 refills | Status: AC

## 2017-07-01 MED ORDER — FREESTYLE SYSTEM KIT: 1.0000 | PACK | Freq: Three times a day (TID) | 1 refills | Status: AC

## 2017-07-01 MED ORDER — ALBUTEROL SULFATE (2.5 MG/3ML) 0.083% IN NEBU: 2.5000 mg | INHALATION_SOLUTION | RESPIRATORY_TRACT | 12 refills | Status: AC | PRN

## 2017-07-01 MED ORDER — IPRATROPIUM-ALBUTEROL 0.5-2.5 (3) MG/3ML IN SOLN: 3.0000 mL | Freq: Four times a day (QID) | RESPIRATORY_TRACT | 0 refills | Status: AC | PRN

## 2017-07-01 MED ORDER — NICOTINE 21 MG/24HR TD PT24: 21.0000 mg | MEDICATED_PATCH | Freq: Every day | TRANSDERMAL | 0 refills | Status: AC

## 2017-07-01 MED ORDER — NICOTINE 14 MG/24HR TD PT24: 14.0000 mg | MEDICATED_PATCH | Freq: Every day | TRANSDERMAL | 0 refills | Status: AC

## 2017-07-01 MED ORDER — IPRATROPIUM-ALBUTEROL 0.5-2.5 (3) MG/3ML IN SOLN: 3.0000 mL | Freq: Two times a day (BID) | RESPIRATORY_TRACT | Status: DC

## 2017-07-01 MED ORDER — NICOTINE 7 MG/24HR TD PT24: 7.0000 mg | MEDICATED_PATCH | Freq: Every day | TRANSDERMAL | 0 refills | Status: AC

## 2017-07-01 MED ORDER — ACETAMINOPHEN 325 MG PO TABS: 650.0000 mg | ORAL_TABLET | Freq: Four times a day (QID) | ORAL | Status: AC | PRN

## 2017-07-01 MED ORDER — LEVOFLOXACIN 750 MG PO TABS: 750.0000 mg | ORAL_TABLET | Freq: Every day | ORAL | 0 refills | Status: AC

## 2017-07-01 NOTE — Progress Notes (Signed)
Went over AVS with patient and husband.  Both verbalized understanding.  Patient left with husband, AVS, portable concentrator and oxygen tank. Virginia [place], RN

## 2017-07-01 NOTE — Care Management Note (Addendum)
Case Management Note  Patient Details  Name: Wanda James MRN: [id number] Date of Birth: 13-Sep-1955  Subjective/Objective:                  Discharged to home/  Action/Plan: Will evaulte for home o2 need per Dr. Trellis Moment Expected Discharge Date:  07/01/17               Expected Discharge Plan:  Home/Self Care  In-House Referral:     Discharge planning Services  CM Consult  Post Acute Care Choice:    Choice offered to:     DME Arranged:    DME Agency:     HH Arranged:    [place]:     Status of Service:  Completed, signed off  If discussed at [employer] of Stay Meetings, dates discussed:    Additional Comments:  Leeroy Cha, RN 07/01/2017, 9:51 AM

## 2017-07-01 NOTE — Discharge Summary (Addendum)
Physician Discharge Summary  Wanda James TOI:[id number] DOB: 10-18-1955 DOA: 06/26/2017  PCP: Gaye Alken, PA-C  Admit date: 06/26/2017 Discharge date: 07/01/2017  Admitted From: home Disposition:  home   Recommendations for Outpatient Follow-up:  1. Encourage smoking cessation 2. Check pulse ox in 2 wks and attempt to wean O2 3. She needs PFTs  4. Continue education in regards to diabetes  Discharge Condition:  stable   CODE STATUS:  Full code   Consultations:  none    Discharge Diagnoses:  Principal Problem:   Acute hypoxemic respiratory failure ([place]) Active Problems:   COPD exacerbation (HCC)   Tobacco abuse   CAP (community acquired pneumonia)   GERD (gastroesophageal reflux disease)   New onset type 2 diabetes mellitus ([place])   Leukocytosis    Brief Summary: Wanda James is a 62 y.o.femalewith medical history significant ofCOPD, tobacco history 1ppd for 40 years, diverticulitis who presents with 2 week history of SOB. She states that her home has been under renovation and since then she has had worsening SOB, dry cough, feeling of air hunger. No fevers, chills, chest pain. No new bowel changes.Admitted for acute respiratory failure with hypoxia and found to have a COPD exacerbation with concomitant pneumonia.  Hospital Course:  Principal Problem:   COPD exacerbation / pneumonia with acute hypoxic resp failure - she has steadily improved with IV steroids and nebs along with Levaquin   - will go home with 2 L O2 for now and needs a pulse ox screen in [redacted] wks along with PFTs - d/c home with Prednisone taper, total 5 days of Levaquin and Nebs with a Neb machine - attempt to wean O2 as outpt - she works near [employer] and cannot return to work as long as she is wearing O2  Active Problems:     GERD (gastroesophageal reflux disease) - cont PPI    Tobacco abuse - smoking cessation done- unfortunately, she states Nicotine patches and gum have never helped and  Chantix is too expensive- I doubt she will quit but I have again prescribed Nicotine patches    New onset type 2 diabetes mellitus  - Hemoglobin A1cand was6.8 -  gived diabetes teaching  Pancreatic insufficiency - cont Creon  Headache  - not improved with Fiorecet or Ultram - due to cough- control cough with cough surpressants   Discharge Exam: Vitals:   07/01/17 0450 07/01/17 0747  BP: 129/86   Pulse: 79   Resp: 15   Temp:    SpO2: 94% 93%   Vitals:   06/30/17 2106 07/01/17 0448 07/01/17 0450 07/01/17 0747  BP:  124/81 129/86   Pulse:  88 79   Resp:  19 15   Temp:  97.6 F (36.4 C)    TempSrc:  Oral    SpO2: 96% 97% 94% 93%  Weight:      Height:        General: Pt is alert, awake, not in acute distress Cardiovascular: RRR, S1/S2 +, no rubs, no gallops Respiratory: CTA bilaterally, no wheezing, no rhonchi Abdominal: Soft, NT, ND, bowel sounds + Extremities: no edema, no cyanosis   Discharge Instructions  Discharge Instructions    DME Nebulizer machine   Complete by:  As directed    Patient needs a nebulizer to treat with the following condition:  COPD with acute exacerbation (HCC)   Diet - low sodium heart healthy   Complete by:  As directed    Diet Carb Modified   Complete by:  As directed    Discharge instructions   Complete by:  As directed    Because of your damaged pancrease, you have mild diabetes. Try not to eat to much sugars. See diabetic diet outlined on your d/c summary.   Increase activity slowly   Complete by:  As directed      Allergies as of 07/01/2017      Reactions   Iohexol Anaphylaxis    Desc: ANAPHALAXIS-ARS ON 08-22-04 Went into a coma   Sodium Hypochlorite Other (See Comments)   Solution used to place on packing burned very bad, patient states she can't use it any more due to this/thjcma   Penicillins Hives, Swelling   Has patient had a PCN reaction causing immediate rash, facial/tongue/throat swelling, SOB or  lightheadedness with hypotension: No Has patient had a PCN reaction causing severe rash involving mucus membranes or skin necrosis: Yes Has patient had a PCN reaction that required hospitalization: No Has patient had a PCN reaction occurring within the last 10 years: No If all of the above answers are "NO", then may proceed with Cephalosporin use.   Hydrocodone Nausea And Vomiting   Tape Rash   Itchy rash      Medication List    TAKE these medications   acetaminophen 325 MG tablet Commonly known as:  TYLENOL Take 2 tablets (650 mg total) by mouth every 6 (six) hours as needed for mild pain (or Fever >/= 101).   albuterol (2.5 MG/3ML) 0.083% nebulizer solution Commonly known as:  PROVENTIL Take 3 mLs (2.5 mg total) by nebulization every 2 (two) hours as needed for wheezing or shortness of breath.   aspirin EC 325 MG tablet Take 325 mg by mouth daily.   CREON 24000-76000 units Cpep Generic drug:  Pancrelipase (Lip-Prot-Amyl) TK 1 C PO TID WITH MEALS AND WITH EVENING SNACKS   dextromethorphan-guaiFENesin 30-600 MG 12hr tablet Commonly known as:  MUCINEX DM Take 1 tablet by mouth 2 (two) times daily.   diphenhydrAMINE 25 MG tablet Commonly known as:  BENADRYL Take 25 mg by mouth 2 (two) times daily as needed (allergic reactions).   docusate sodium 100 MG capsule Commonly known as:  COLACE Take 100 mg by mouth as needed for mild constipation.   glucose monitoring kit monitoring kit 1 each by Does not apply route 4 (four) times daily - after meals and at bedtime. 1 month Diabetic Testing Supplies for QAC-QHS accuchecks.   ipratropium-albuterol 0.5-2.5 (3) MG/3ML Soln Commonly known as:  DUONEB Take 3 mLs by nebulization every 6 (six) hours as needed.   levofloxacin 750 MG tablet Commonly known as:  LEVAQUIN Take 1 tablet (750 mg total) by mouth daily.   Melatonin 3 MG Tabs Take 3 mg by mouth at bedtime.   nicotine 21 mg/24hr patch Commonly known as:  NICODERM CQ -  dosed in mg/24 hours Place 1 patch (21 mg total) onto the skin daily for 14 days.   nicotine 14 mg/24hr patch Commonly known as:  NICODERM CQ Place 1 patch (14 mg total) onto the skin daily. Start when the 21 mg patches are finished   nicotine 7 mg/24hr patch Commonly known as:  NICODERM CQ Place 1 patch (7 mg total) onto the skin daily.   omeprazole 40 MG capsule Commonly known as:  PRILOSEC TK ONE C PO QD   promethazine 25 MG tablet Commonly known as:  PHENERGAN TK 1/2 T PO BID PRF NAUSEA   traMADol 50 MG tablet Commonly known as:  ULTRAM Take 50 mg by mouth every 6 (six) hours as needed for severe pain.            Durable Medical Equipment  (From admission, onward)        Start     Ordered   07/01/17 0000  DME Nebulizer machine    Question:  Patient needs a nebulizer to treat with the following condition  Answer:  COPD with acute exacerbation ([place])   07/01/17 0944      Allergies  Allergen Reactions  . Iohexol Anaphylaxis     Desc: ANAPHALAXIS-ARS ON 08-22-04 Went into a coma  . Sodium Hypochlorite Other (See Comments)    Solution used to place on packing burned very bad, patient states she can't use it any more due to this/thjcma  . Penicillins Hives and Swelling    Has patient had a PCN reaction causing immediate rash, facial/tongue/throat swelling, SOB or lightheadedness with hypotension: No Has patient had a PCN reaction causing severe rash involving mucus membranes or skin necrosis: Yes Has patient had a PCN reaction that required hospitalization: No Has patient had a PCN reaction occurring within the last 10 years: No If all of the above answers are "NO", then may proceed with Cephalosporin use.   Marland Kitchen Hydrocodone Nausea And Vomiting  . Tape Rash    Itchy rash     Procedures/Studies:    Dg Chest 2 View  Result Date: 06/26/2017 CLINICAL DATA:  Pt c/o SOB x2-3 weeks. Hx of COPD, PNA. Smokes 1 ppd. EXAM: CHEST - 2 VIEW COMPARISON:  03/05/2015  FINDINGS: Cardiac silhouette is normal in size. No mediastinal or hilar masses. No evidence of adenopathy. Lungs are mildly hyperexpanded. There are reticular and linear lung base opacities, greater on the right, similar to the prior study. It intervening hazy airspace opacity is noted in the posterolateral right lower lobe, less confluent than noted on the prior exam. Remainder of the lungs is clear. No pleural effusion or pneumothorax. Skeletal structures are intact. IMPRESSION: 1. No convincing acute cardiopulmonary disease. 2. Right greater than left lung base opacities most likely due to interstitial fibrosis. Electronically Signed   By: Lajean Manes M.D.   On: 06/26/2017 15:32   Dg Chest Port 1 View  Result Date: 06/29/2017 CLINICAL DATA:  Short of breath EXAM: PORTABLE CHEST 1 VIEW COMPARISON:  06/28/2017 FINDINGS: Normal heart size. Hyperaeration. Stable airspace disease at the right base. Persistent volume loss in the right lung with shift of the mediastinum to the right. No pneumothorax. No definite pleural effusion. IMPRESSION: Stable airspace disease at the right lung base with volume loss. Electronically Signed   By: Marybelle Killings M.D.   On: 06/29/2017 06:59   Dg Chest Port 1 View  Result Date: 06/28/2017 CLINICAL DATA:  Shortness of breath EXAM: PORTABLE CHEST 1 VIEW COMPARISON:  06/26/2017 FINDINGS: Cardiac shadow is stable. The lungs are well aerated bilaterally with persistent right basilar infiltrate. No focal effusion is seen. No bony abnormality is noted. IMPRESSION: Persistent right basilar infiltrate.  The left lung remains clear. Electronically Signed   By: Inez Catalina M.D.   On: 06/28/2017 07:52     The results of significant diagnostics from this hospitalization (including imaging, microbiology, ancillary and laboratory) are listed below for reference.     Microbiology: Recent Results (from the past 240 hour(s))  Culture, blood (routine x 2)     Status: None   Collection  Time: 06/26/17  3:06 PM  Result Value Ref Range  Status   Specimen Description   Final    BLOOD RIGHT ANTECUBITAL Performed at [facility] [address] [place], [place] [address]    Special Requests   Final    BOTTLES DRAWN AEROBIC AND ANAEROBIC Blood Culture adequate volume Performed at [facility] [address], [place], [place] [address]    Culture   Final    NO GROWTH 5 DAYS Performed at [facility], [place] [address] [place], [place] [address]    Report Status 07/01/2017 FINAL  Final  Culture, blood (routine x 2)     Status: None   Collection Time: 06/26/17  3:06 PM  Result Value Ref Range Status   Specimen Description   Final    BLOOD LEFT ANTECUBITAL Performed at [facility] [address], [place], [place] [address]    Special Requests   Final    BOTTLES DRAWN AEROBIC AND ANAEROBIC Blood Culture adequate volume Performed at [facility] [address], [place], [place] [address]    Culture   Final    NO GROWTH 5 DAYS Performed at [facility], [place] [address] [place], [place] [address]    Report Status 07/01/2017 FINAL  Final  Respiratory Panel by PCR     Status: None   Collection Time: 06/27/17 11:07 AM  Result Value Ref Range Status   Adenovirus NOT DETECTED NOT DETECTED Final   Coronavirus 229E NOT DETECTED NOT DETECTED Final   Coronavirus HKU1 NOT DETECTED NOT DETECTED Final   Coronavirus NL63 NOT DETECTED NOT DETECTED Final   Coronavirus OC43 NOT DETECTED NOT DETECTED Final   Metapneumovirus NOT DETECTED NOT DETECTED Final   Rhinovirus / Enterovirus NOT DETECTED NOT DETECTED Final   Influenza A NOT DETECTED NOT DETECTED Final   Influenza B NOT DETECTED NOT DETECTED Final   Parainfluenza Virus 1 NOT DETECTED NOT DETECTED Final   Parainfluenza Virus 2 NOT DETECTED NOT DETECTED Final   Parainfluenza Virus 3 NOT DETECTED NOT DETECTED Final   Parainfluenza Virus 4 NOT  DETECTED NOT DETECTED Final   Respiratory Syncytial Virus NOT DETECTED NOT DETECTED Final   Bordetella pertussis NOT DETECTED NOT DETECTED Final   Chlamydophila pneumoniae NOT DETECTED NOT DETECTED Final   Mycoplasma pneumoniae NOT DETECTED NOT DETECTED Final    Comment: Performed at [facility], [place] [address], [place],  [address]     Labs: BNP (last 3 results) Recent Labs    06/26/17 1506  BNP 36.6   Basic Metabolic Panel: Recent Labs  Lab 06/26/17 1506 06/27/17 0600 06/28/17 0559 06/29/17 0511  NA 143 141 141 141  K 4.0 3.8 3.8 3.9  CL 106 104 103 104  CO2 '24 24 28 28  '$ GLUCOSE 103* 173* 132* 107*  BUN '20 20 20 22  '$ CREATININE 0.94 0.85 0.69 0.68  CALCIUM 9.0 9.1 9.1 9.1  MG  --   --  1.7 1.7  PHOS  --   --  3.3 3.1   Liver Function Tests: Recent Labs  Lab 06/28/17 0559 06/29/17 0511  AST 28 25  ALT 19 18  ALKPHOS 144* 139*  BILITOT 0.3 0.3  PROT 6.6 6.3*  ALBUMIN 3.1* 2.9*   No results for input(s): LIPASE, AMYLASE in the last 168 hours. No results for input(s): AMMONIA in the last 168 hours. CBC: Recent Labs  Lab 06/26/17 1506 06/27/17 0600 06/28/17 0559 06/29/17 0511  WBC 5.5 6.7 13.0* 11.4*  NEUTROABS 3.6  --  12.3* 10.7*  HGB 15.3* 13.7 13.7 13.4  HCT 47.2* 43.3 42.5 41.8  MCV 91.7 91.0 90.6 90.7  PLT 288 306 289 296   Cardiac Enzymes: No results for input(s): CKTOTAL, CKMB, CKMBINDEX, TROPONINI in the last 168 hours. BNP: Invalid input(s): POCBNP CBG: Recent Labs  Lab 06/30/17 0756 06/30/17 1216 06/30/17 1729 06/30/17 2313 07/01/17 0742  GLUCAP 100* 108* 144* 109* 95   D-Dimer No results for input(s): DDIMER in the last 72 hours. Hgb A1c No results for input(s): HGBA1C in the last 72 hours. Lipid Profile No results for input(s): CHOL, HDL, LDLCALC, TRIG, CHOLHDL, LDLDIRECT in the last 72 hours. Thyroid function studies No results for input(s): TSH, T4TOTAL, T3FREE, THYROIDAB in the last 72 hours.  Invalid  input(s): FREET3 Anemia work up No results for input(s): VITAMINB12, FOLATE, FERRITIN, TIBC, IRON, RETICCTPCT in the last 72 hours. Urinalysis    Component Value Date/Time   COLORURINE YELLOW 08/16/2016 1441   APPEARANCEUR CLEAR 08/16/2016 1441   LABSPEC 1.021 08/16/2016 1441   PHURINE 5.0 08/16/2016 1441   GLUCOSEU NEGATIVE 08/16/2016 1441   HGBUR MODERATE (A) 08/16/2016 1441   BILIRUBINUR NEGATIVE 08/16/2016 1441   KETONESUR NEGATIVE 08/16/2016 1441   PROTEINUR 100 (A) 08/16/2016 1441   UROBILINOGEN 0.2 05/29/2008 1030   NITRITE NEGATIVE 08/16/2016 1441   LEUKOCYTESUR TRACE (A) 08/16/2016 1441   Sepsis Labs Invalid input(s): PROCALCITONIN,  WBC,  LACTICIDVEN Microbiology Recent Results (from the past 240 hour(s))  Culture, blood (routine x 2)     Status: None   Collection Time: 06/26/17  3:06 PM  Result Value Ref Range Status   Specimen Description   Final    BLOOD RIGHT ANTECUBITAL Performed at [facility], [place] [address], [place], [place] [address]    Special Requests   Final    BOTTLES DRAWN AEROBIC AND ANAEROBIC Blood Culture adequate volume Performed at [facility] [address], [place], [place] [address]    Culture   Final    NO GROWTH 5 DAYS Performed at [facility], [place] [address] [place], [place] [address]    Report Status 07/01/2017 FINAL  Final  Culture, blood (routine x 2)     Status: None   Collection Time: 06/26/17  3:06 PM  Result Value Ref Range Status   Specimen Description   Final    BLOOD LEFT ANTECUBITAL Performed at [facility] [address] [place], [place] [address]    Special Requests   Final    BOTTLES DRAWN AEROBIC AND ANAEROBIC Blood Culture adequate volume Performed at [facility] [address] [place], [place] [address]    Culture   Final    NO GROWTH 5 DAYS Performed at [facility], [place] [address], [place], [place] [address]     Report Status 07/01/2017 FINAL  Final  Respiratory Panel by PCR     Status: None   Collection Time: 06/27/17 11:07 AM  Result Value Ref Range Status   Adenovirus NOT DETECTED NOT DETECTED Final   Coronavirus 229E NOT DETECTED NOT DETECTED Final   Coronavirus HKU1 NOT DETECTED NOT DETECTED Final   Coronavirus NL63 NOT DETECTED NOT DETECTED Final   Coronavirus OC43 NOT DETECTED NOT DETECTED Final   Metapneumovirus NOT DETECTED NOT DETECTED Final   Rhinovirus / Enterovirus NOT DETECTED NOT DETECTED Final   Influenza A NOT DETECTED NOT DETECTED Final   Influenza B NOT DETECTED  NOT DETECTED Final   Parainfluenza Virus 1 NOT DETECTED NOT DETECTED Final   Parainfluenza Virus 2 NOT DETECTED NOT DETECTED Final   Parainfluenza Virus 3 NOT DETECTED NOT DETECTED Final   Parainfluenza Virus 4 NOT DETECTED NOT DETECTED Final   Respiratory Syncytial Virus NOT DETECTED NOT DETECTED Final   Bordetella pertussis NOT DETECTED NOT DETECTED Final   Chlamydophila pneumoniae NOT DETECTED NOT DETECTED Final   Mycoplasma pneumoniae NOT DETECTED NOT DETECTED Final    Comment: Performed at [facility], [place] [address], [place], [place] [address]     Time coordinating discharge in minutes: 65  SIGNED:   Debbe Odea, MD  [facility] 07/01/2017, 9:47 AM Pager   If 7PM-7AM, please contact night-coverage [email or web address] Password [email or web address]

## 2017-07-01 NOTE — Progress Notes (Addendum)
Pt has become upset after a trip to the bathroom and her oxygen tubing was too short and she got very SOB & possibly a but anxious as she reports tingling in her arms. She did not call for assistance. Lungs appear to be clear with no wheezing. Pt is giving me the silent treatment and not wanting to answer questions requiring me to ask multiple times how I could help, Making remarks about not wanting any help but appears in distress. It was later determined she still has a frontal H/A so  Ultram & tylenol given.

## 2017-07-01 NOTE — Progress Notes (Signed)
SATURATION QUALIFICATIONS: (This note is used to comply with regulatory documentation for home oxygen)  Patient Saturations on Room Air at Rest = 88%  Patient Saturations on Room Air while Ambulating = 87%  Patient Saturations on 2 Liters of oxygen while Ambulating = 93%  Please briefly explain why patient needs home oxygen:  Patient becomes short of breath without 2L of oxygen via nasal canuala.  Virginia Rochester, RN

## 2017-07-02 ENCOUNTER — Telehealth: Payer: Self-pay | Admitting: *Deleted

## 2017-07-23 ENCOUNTER — Emergency Department (HOSPITAL_COMMUNITY): Payer: BLUE CROSS/BLUE SHIELD

## 2017-07-23 ENCOUNTER — Encounter (HOSPITAL_COMMUNITY): Payer: Self-pay | Admitting: *Deleted

## 2017-07-23 ENCOUNTER — Inpatient Hospital Stay (HOSPITAL_COMMUNITY)
Admission: EM | Admit: 2017-07-23 | Discharge: 2017-09-04 | DRG: 208 | Disposition: E | Payer: BLUE CROSS/BLUE SHIELD | Attending: Internal Medicine | Admitting: Internal Medicine

## 2017-07-23 ENCOUNTER — Other Ambulatory Visit: Payer: Self-pay

## 2017-07-23 DIAGNOSIS — Z79899 Other long term (current) drug therapy: Secondary | ICD-10-CM

## 2017-07-23 DIAGNOSIS — R0602 Shortness of breath: Secondary | ICD-10-CM

## 2017-07-23 DIAGNOSIS — J9611 Chronic respiratory failure with hypoxia: Secondary | ICD-10-CM | POA: Diagnosis present

## 2017-07-23 DIAGNOSIS — N179 Acute kidney failure, unspecified: Secondary | ICD-10-CM

## 2017-07-23 DIAGNOSIS — J90 Pleural effusion, not elsewhere classified: Secondary | ICD-10-CM

## 2017-07-23 DIAGNOSIS — K7290 Hepatic failure, unspecified without coma: Secondary | ICD-10-CM | POA: Diagnosis not present

## 2017-07-23 DIAGNOSIS — J441 Chronic obstructive pulmonary disease with (acute) exacerbation: Secondary | ICD-10-CM | POA: Diagnosis present

## 2017-07-23 DIAGNOSIS — E878 Other disorders of electrolyte and fluid balance, not elsewhere classified: Secondary | ICD-10-CM | POA: Diagnosis not present

## 2017-07-23 DIAGNOSIS — R948 Abnormal results of function studies of other organs and systems: Secondary | ICD-10-CM | POA: Diagnosis not present

## 2017-07-23 DIAGNOSIS — Z7951 Long term (current) use of inhaled steroids: Secondary | ICD-10-CM

## 2017-07-23 DIAGNOSIS — R0789 Other chest pain: Secondary | ICD-10-CM | POA: Diagnosis present

## 2017-07-23 DIAGNOSIS — E440 Moderate protein-calorie malnutrition: Secondary | ICD-10-CM | POA: Diagnosis present

## 2017-07-23 DIAGNOSIS — F17200 Nicotine dependence, unspecified, uncomplicated: Secondary | ICD-10-CM

## 2017-07-23 DIAGNOSIS — J9620 Acute and chronic respiratory failure, unspecified whether with hypoxia or hypercapnia: Secondary | ICD-10-CM

## 2017-07-23 DIAGNOSIS — Z85528 Personal history of other malignant neoplasm of kidney: Secondary | ICD-10-CM

## 2017-07-23 DIAGNOSIS — J189 Pneumonia, unspecified organism: Secondary | ICD-10-CM | POA: Diagnosis not present

## 2017-07-23 DIAGNOSIS — Z801 Family history of malignant neoplasm of trachea, bronchus and lung: Secondary | ICD-10-CM

## 2017-07-23 DIAGNOSIS — C3490 Malignant neoplasm of unspecified part of unspecified bronchus or lung: Secondary | ICD-10-CM

## 2017-07-23 DIAGNOSIS — I700 Atherosclerosis of aorta: Secondary | ICD-10-CM | POA: Diagnosis present

## 2017-07-23 DIAGNOSIS — C801 Malignant (primary) neoplasm, unspecified: Secondary | ICD-10-CM | POA: Diagnosis not present

## 2017-07-23 DIAGNOSIS — A419 Sepsis, unspecified organism: Secondary | ICD-10-CM | POA: Diagnosis present

## 2017-07-23 DIAGNOSIS — G9340 Encephalopathy, unspecified: Secondary | ICD-10-CM | POA: Diagnosis present

## 2017-07-23 DIAGNOSIS — J440 Chronic obstructive pulmonary disease with acute lower respiratory infection: Secondary | ICD-10-CM | POA: Diagnosis present

## 2017-07-23 DIAGNOSIS — Z9981 Dependence on supplemental oxygen: Secondary | ICD-10-CM | POA: Diagnosis not present

## 2017-07-23 DIAGNOSIS — G893 Neoplasm related pain (acute) (chronic): Secondary | ICD-10-CM | POA: Diagnosis present

## 2017-07-23 DIAGNOSIS — E7220 Disorder of urea cycle metabolism, unspecified: Secondary | ICD-10-CM | POA: Diagnosis present

## 2017-07-23 DIAGNOSIS — C787 Secondary malignant neoplasm of liver and intrahepatic bile duct: Secondary | ICD-10-CM

## 2017-07-23 DIAGNOSIS — Z515 Encounter for palliative care: Secondary | ICD-10-CM

## 2017-07-23 DIAGNOSIS — J9621 Acute and chronic respiratory failure with hypoxia: Secondary | ICD-10-CM | POA: Diagnosis present

## 2017-07-23 DIAGNOSIS — Z91048 Other nonmedicinal substance allergy status: Secondary | ICD-10-CM

## 2017-07-23 DIAGNOSIS — N2881 Hypertrophy of kidney: Secondary | ICD-10-CM | POA: Diagnosis present

## 2017-07-23 DIAGNOSIS — Z6826 Body mass index (BMI) 26.0-26.9, adult: Secondary | ICD-10-CM

## 2017-07-23 DIAGNOSIS — R7989 Other specified abnormal findings of blood chemistry: Secondary | ICD-10-CM

## 2017-07-23 DIAGNOSIS — R578 Other shock: Secondary | ICD-10-CM | POA: Diagnosis not present

## 2017-07-23 DIAGNOSIS — Z0189 Encounter for other specified special examinations: Secondary | ICD-10-CM

## 2017-07-23 DIAGNOSIS — K7200 Acute and subacute hepatic failure without coma: Secondary | ICD-10-CM | POA: Diagnosis not present

## 2017-07-23 DIAGNOSIS — J9610 Chronic respiratory failure, unspecified whether with hypoxia or hypercapnia: Secondary | ICD-10-CM | POA: Diagnosis not present

## 2017-07-23 DIAGNOSIS — Z8673 Personal history of transient ischemic attack (TIA), and cerebral infarction without residual deficits: Secondary | ICD-10-CM

## 2017-07-23 DIAGNOSIS — K5909 Other constipation: Secondary | ICD-10-CM

## 2017-07-23 DIAGNOSIS — E1165 Type 2 diabetes mellitus with hyperglycemia: Secondary | ICD-10-CM | POA: Diagnosis present

## 2017-07-23 DIAGNOSIS — R1310 Dysphagia, unspecified: Secondary | ICD-10-CM | POA: Diagnosis present

## 2017-07-23 DIAGNOSIS — R6520 Severe sepsis without septic shock: Secondary | ICD-10-CM | POA: Diagnosis present

## 2017-07-23 DIAGNOSIS — R109 Unspecified abdominal pain: Secondary | ICD-10-CM | POA: Diagnosis not present

## 2017-07-23 DIAGNOSIS — E872 Acidosis, unspecified: Secondary | ICD-10-CM

## 2017-07-23 DIAGNOSIS — Y95 Nosocomial condition: Secondary | ICD-10-CM | POA: Diagnosis present

## 2017-07-23 DIAGNOSIS — R079 Chest pain, unspecified: Secondary | ICD-10-CM | POA: Diagnosis not present

## 2017-07-23 DIAGNOSIS — J449 Chronic obstructive pulmonary disease, unspecified: Secondary | ICD-10-CM | POA: Diagnosis not present

## 2017-07-23 DIAGNOSIS — Z888 Allergy status to other drugs, medicaments and biological substances status: Secondary | ICD-10-CM

## 2017-07-23 DIAGNOSIS — M1990 Unspecified osteoarthritis, unspecified site: Secondary | ICD-10-CM | POA: Diagnosis present

## 2017-07-23 DIAGNOSIS — I2699 Other pulmonary embolism without acute cor pulmonale: Secondary | ICD-10-CM | POA: Diagnosis present

## 2017-07-23 DIAGNOSIS — F411 Generalized anxiety disorder: Secondary | ICD-10-CM | POA: Diagnosis not present

## 2017-07-23 DIAGNOSIS — E875 Hyperkalemia: Secondary | ICD-10-CM | POA: Diagnosis not present

## 2017-07-23 DIAGNOSIS — J9811 Atelectasis: Secondary | ICD-10-CM | POA: Diagnosis present

## 2017-07-23 DIAGNOSIS — M545 Low back pain: Secondary | ICD-10-CM | POA: Diagnosis present

## 2017-07-23 DIAGNOSIS — R112 Nausea with vomiting, unspecified: Secondary | ICD-10-CM | POA: Diagnosis not present

## 2017-07-23 DIAGNOSIS — Z905 Acquired absence of kidney: Secondary | ICD-10-CM

## 2017-07-23 DIAGNOSIS — F17210 Nicotine dependence, cigarettes, uncomplicated: Secondary | ICD-10-CM | POA: Diagnosis present

## 2017-07-23 DIAGNOSIS — Z978 Presence of other specified devices: Secondary | ICD-10-CM

## 2017-07-23 DIAGNOSIS — Z923 Personal history of irradiation: Secondary | ICD-10-CM

## 2017-07-23 DIAGNOSIS — Z8701 Personal history of pneumonia (recurrent): Secondary | ICD-10-CM

## 2017-07-23 DIAGNOSIS — M549 Dorsalgia, unspecified: Secondary | ICD-10-CM | POA: Diagnosis present

## 2017-07-23 DIAGNOSIS — Z66 Do not resuscitate: Secondary | ICD-10-CM | POA: Diagnosis not present

## 2017-07-23 DIAGNOSIS — Z880 Allergy status to penicillin: Secondary | ICD-10-CM

## 2017-07-23 DIAGNOSIS — J181 Lobar pneumonia, unspecified organism: Secondary | ICD-10-CM | POA: Diagnosis present

## 2017-07-23 DIAGNOSIS — R590 Localized enlarged lymph nodes: Secondary | ICD-10-CM | POA: Diagnosis present

## 2017-07-23 DIAGNOSIS — K219 Gastro-esophageal reflux disease without esophagitis: Secondary | ICD-10-CM | POA: Diagnosis present

## 2017-07-23 DIAGNOSIS — C3431 Malignant neoplasm of lower lobe, right bronchus or lung: Secondary | ICD-10-CM | POA: Diagnosis not present

## 2017-07-23 DIAGNOSIS — E43 Unspecified severe protein-calorie malnutrition: Secondary | ICD-10-CM | POA: Diagnosis not present

## 2017-07-23 DIAGNOSIS — I248 Other forms of acute ischemic heart disease: Secondary | ICD-10-CM | POA: Diagnosis present

## 2017-07-23 DIAGNOSIS — J9601 Acute respiratory failure with hypoxia: Secondary | ICD-10-CM | POA: Diagnosis present

## 2017-07-23 DIAGNOSIS — R0902 Hypoxemia: Secondary | ICD-10-CM | POA: Diagnosis not present

## 2017-07-23 DIAGNOSIS — R579 Shock, unspecified: Secondary | ICD-10-CM

## 2017-07-23 DIAGNOSIS — Z7982 Long term (current) use of aspirin: Secondary | ICD-10-CM

## 2017-07-23 DIAGNOSIS — R918 Other nonspecific abnormal finding of lung field: Secondary | ICD-10-CM | POA: Diagnosis not present

## 2017-07-23 DIAGNOSIS — Z6820 Body mass index (BMI) 20.0-20.9, adult: Secondary | ICD-10-CM | POA: Diagnosis not present

## 2017-07-23 DIAGNOSIS — K5900 Constipation, unspecified: Secondary | ICD-10-CM | POA: Diagnosis not present

## 2017-07-23 DIAGNOSIS — R778 Other specified abnormalities of plasma proteins: Secondary | ICD-10-CM

## 2017-07-23 DIAGNOSIS — Z7189 Other specified counseling: Secondary | ICD-10-CM | POA: Diagnosis not present

## 2017-07-23 DIAGNOSIS — R000 Tachycardia, unspecified: Secondary | ICD-10-CM | POA: Diagnosis not present

## 2017-07-23 LAB — CBC WITH DIFFERENTIAL/PLATELET
Basophils Absolute: 0 10*3/uL (ref 0.0–0.1)
Basophils Relative: 1 %
EOS PCT: 2 %
Eosinophils Absolute: 0.2 10*3/uL (ref 0.0–0.7)
HCT: 43.7 % (ref 36.0–46.0)
Hemoglobin: 14.5 g/dL (ref 12.0–15.0)
LYMPHS PCT: 12 %
Lymphs Abs: 0.8 10*3/uL (ref 0.7–4.0)
MCH: 29.4 pg (ref 26.0–34.0)
MCHC: 33.2 g/dL (ref 30.0–36.0)
MCV: 88.6 fL (ref 78.0–100.0)
MONO ABS: 0.8 10*3/uL (ref 0.1–1.0)
MONOS PCT: 12 %
Neutro Abs: 5 10*3/uL (ref 1.7–7.7)
Neutrophils Relative %: 73 %
PLATELETS: 304 10*3/uL (ref 150–400)
RBC: 4.93 MIL/uL (ref 3.87–5.11)
RDW: 14.7 % (ref 11.5–15.5)
WBC: 6.8 10*3/uL (ref 4.0–10.5)

## 2017-07-23 LAB — COMPREHENSIVE METABOLIC PANEL
ALT: 129 U/L — ABNORMAL HIGH (ref 0–44)
ANION GAP: 13 (ref 5–15)
AST: 193 U/L — AB (ref 15–41)
Albumin: 2.9 g/dL — ABNORMAL LOW (ref 3.5–5.0)
Alkaline Phosphatase: 518 U/L — ABNORMAL HIGH (ref 38–126)
BUN: 11 mg/dL (ref 8–23)
CHLORIDE: 99 mmol/L (ref 98–111)
CO2: 26 mmol/L (ref 22–32)
Calcium: 9.1 mg/dL (ref 8.9–10.3)
Creatinine, Ser: 0.81 mg/dL (ref 0.44–1.00)
Glucose, Bld: 118 mg/dL — ABNORMAL HIGH (ref 70–99)
POTASSIUM: 3.7 mmol/L (ref 3.5–5.1)
Sodium: 138 mmol/L (ref 135–145)
TOTAL PROTEIN: 7 g/dL (ref 6.5–8.1)
Total Bilirubin: 0.3 mg/dL (ref 0.3–1.2)

## 2017-07-23 LAB — TROPONIN I: Troponin I: <0.03 ng/mL (ref <0.03)

## 2017-07-23 LAB — I-STAT CG4 LACTIC ACID, ED
LACTIC ACID, VENOUS: 1.49 mmol/L (ref 0.5–1.9)
Lactic Acid, Venous: 2.27 mmol/L (ref 0.5–1.9)

## 2017-07-23 LAB — I-STAT CHEM 8, ED
BUN: 9 mg/dL (ref 8–23)
CHLORIDE: 99 mmol/L (ref 98–111)
Calcium, Ion: 1.12 mmol/L — ABNORMAL LOW (ref 1.15–1.40)
Creatinine, Ser: 0.8 mg/dL (ref 0.44–1.00)
Glucose, Bld: 116 mg/dL — ABNORMAL HIGH (ref 70–99)
HEMATOCRIT: 46 % (ref 36.0–46.0)
HEMOGLOBIN: 15.6 g/dL — AB (ref 12.0–15.0)
POTASSIUM: 3.7 mmol/L (ref 3.5–5.1)
SODIUM: 136 mmol/L (ref 135–145)
TCO2: 24 mmol/L (ref 22–32)

## 2017-07-23 LAB — I-STAT TROPONIN, ED: Troponin i, poc: 0 ng/mL (ref 0.00–0.08)

## 2017-07-23 LAB — LIPASE, BLOOD: Lipase: 99 U/L — ABNORMAL HIGH (ref 11–51)

## 2017-07-23 MED ORDER — ONDANSETRON HCL 4 MG/2ML IJ SOLN
4.0000 mg | Freq: Once | INTRAMUSCULAR | Status: AC
  Administered 2017-07-23: 4 mg via INTRAVENOUS
  Filled 2017-07-23: qty 2

## 2017-07-23 MED ORDER — GUAIFENESIN ER 600 MG PO TB12
600.0000 mg | ORAL_TABLET | Freq: Two times a day (BID) | ORAL | Status: DC
  Administered 2017-07-23 – 2017-07-30 (×13): 600 mg via ORAL
  Filled 2017-07-23 (×16): qty 1

## 2017-07-23 MED ORDER — ALBUTEROL SULFATE (2.5 MG/3ML) 0.083% IN NEBU
2.5000 mg | INHALATION_SOLUTION | Freq: Three times a day (TID) | RESPIRATORY_TRACT | Status: DC
  Administered 2017-07-24 – 2017-07-30 (×19): 2.5 mg via RESPIRATORY_TRACT
  Filled 2017-07-23 (×18): qty 3

## 2017-07-23 MED ORDER — METHYLPREDNISOLONE SODIUM SUCC 125 MG IJ SOLR
80.0000 mg | Freq: Three times a day (TID) | INTRAMUSCULAR | Status: DC
  Administered 2017-07-23 – 2017-07-25 (×5): 80 mg via INTRAVENOUS
  Filled 2017-07-23 (×5): qty 2

## 2017-07-23 MED ORDER — MORPHINE SULFATE (PF) 4 MG/ML IV SOLN
4.0000 mg | Freq: Once | INTRAVENOUS | Status: AC
  Administered 2017-07-23: 4 mg via INTRAVENOUS
  Filled 2017-07-23: qty 1

## 2017-07-23 MED ORDER — ALBUTEROL SULFATE (2.5 MG/3ML) 0.083% IN NEBU
2.5000 mg | INHALATION_SOLUTION | Freq: Four times a day (QID) | RESPIRATORY_TRACT | Status: DC | PRN
  Administered 2017-07-23: 2.5 mg via RESPIRATORY_TRACT

## 2017-07-23 MED ORDER — ASPIRIN EC 325 MG PO TBEC
325.0000 mg | DELAYED_RELEASE_TABLET | Freq: Every day | ORAL | Status: DC
  Administered 2017-07-24 – 2017-08-01 (×7): 325 mg via ORAL
  Filled 2017-07-23 (×9): qty 1

## 2017-07-23 MED ORDER — PRO-STAT SUGAR FREE PO LIQD
30.0000 mL | Freq: Two times a day (BID) | ORAL | Status: DC
  Administered 2017-07-24 – 2017-07-25 (×2): 30 mL via ORAL
  Filled 2017-07-23 (×6): qty 30

## 2017-07-23 MED ORDER — ONDANSETRON HCL 4 MG/2ML IJ SOLN
4.0000 mg | Freq: Four times a day (QID) | INTRAMUSCULAR | Status: DC | PRN
  Administered 2017-07-23 – 2017-08-01 (×6): 4 mg via INTRAVENOUS
  Filled 2017-07-23 (×5): qty 2

## 2017-07-23 MED ORDER — TIOTROPIUM BROMIDE MONOHYDRATE 18 MCG IN CAPS
18.0000 ug | ORAL_CAPSULE | Freq: Every day | RESPIRATORY_TRACT | Status: DC
  Administered 2017-07-24 – 2017-07-31 (×6): 18 ug via RESPIRATORY_TRACT
  Filled 2017-07-23 (×2): qty 5

## 2017-07-23 MED ORDER — SODIUM CHLORIDE 0.9 % IV SOLN
2.0000 g | Freq: Once | INTRAVENOUS | Status: AC
  Administered 2017-07-23: 2 g via INTRAVENOUS
  Filled 2017-07-23: qty 2

## 2017-07-23 MED ORDER — SODIUM CHLORIDE 0.9 % IV BOLUS
1000.0000 mL | Freq: Once | INTRAVENOUS | Status: AC
  Administered 2017-07-23: 1000 mL via INTRAVENOUS

## 2017-07-23 MED ORDER — PANCRELIPASE (LIP-PROT-AMYL) 12000-38000 UNITS PO CPEP
12000.0000 [IU] | ORAL_CAPSULE | Freq: Three times a day (TID) | ORAL | Status: DC
  Administered 2017-07-24 – 2017-08-04 (×22): 12000 [IU] via ORAL
  Filled 2017-07-23 (×29): qty 1

## 2017-07-23 MED ORDER — TRAMADOL HCL 50 MG PO TABS
50.0000 mg | ORAL_TABLET | Freq: Four times a day (QID) | ORAL | Status: DC | PRN
  Administered 2017-07-24 (×3): 50 mg via ORAL
  Filled 2017-07-23 (×3): qty 1

## 2017-07-23 MED ORDER — ALBUTEROL SULFATE (2.5 MG/3ML) 0.083% IN NEBU
2.5000 mg | INHALATION_SOLUTION | Freq: Four times a day (QID) | RESPIRATORY_TRACT | Status: DC
  Filled 2017-07-23: qty 3

## 2017-07-23 MED ORDER — MOMETASONE FURO-FORMOTEROL FUM 100-5 MCG/ACT IN AERO
2.0000 | INHALATION_SPRAY | Freq: Two times a day (BID) | RESPIRATORY_TRACT | Status: DC
  Administered 2017-07-24 – 2017-07-31 (×15): 2 via RESPIRATORY_TRACT
  Filled 2017-07-23: qty 8.8

## 2017-07-23 MED ORDER — VANCOMYCIN HCL IN DEXTROSE 1-5 GM/200ML-% IV SOLN
1000.0000 mg | Freq: Once | INTRAVENOUS | Status: AC
  Administered 2017-07-23: 1000 mg via INTRAVENOUS
  Filled 2017-07-23: qty 200

## 2017-07-23 MED ORDER — SODIUM CHLORIDE 0.9 % IV SOLN
1.0000 g | Freq: Three times a day (TID) | INTRAVENOUS | Status: AC
  Administered 2017-07-24 – 2017-07-31 (×24): 1 g via INTRAVENOUS
  Filled 2017-07-23 (×25): qty 1

## 2017-07-23 MED ORDER — VANCOMYCIN HCL IN DEXTROSE 750-5 MG/150ML-% IV SOLN
750.0000 mg | INTRAVENOUS | Status: DC
  Administered 2017-07-24: 750 mg via INTRAVENOUS
  Filled 2017-07-23 (×2): qty 150

## 2017-07-23 MED ORDER — NICOTINE 21 MG/24HR TD PT24
21.0000 mg | MEDICATED_PATCH | Freq: Every day | TRANSDERMAL | Status: DC
  Administered 2017-07-24 – 2017-07-27 (×4): 21 mg via TRANSDERMAL
  Filled 2017-07-23 (×4): qty 1

## 2017-07-23 MED ORDER — ENOXAPARIN SODIUM 40 MG/0.4ML ~~LOC~~ SOLN
40.0000 mg | SUBCUTANEOUS | Status: DC
  Administered 2017-07-24: 40 mg via SUBCUTANEOUS
  Filled 2017-07-23 (×2): qty 0.4

## 2017-07-23 NOTE — ED Provider Notes (Signed)
[facility] DEPT Provider Note   CSN: [id number] Arrival date & time: 07/31/2017  1941     History   Chief Complaint Chief Complaint  Patient presents with  . Chest Pain    HPI Wanda James is a 62 y.o. female hx of COPD on 2 L [place] at baseline, here presenting with chest pain, shortness of breath.  Patient states that she was admitted to the hospital about a month ago for COPD exacerbation.  She finished a course of antibiotics and prednisone.  She recently restarted on a course of antibiotics due to her cough.  For the last week or so, she has been having poor appetite and nausea.  She has worsening cough and shortness of breath for the last 2 days.  She states that she actually had a vertebral fracture from coughing.  Denies any fevers.  Patient has a complicated surgical history as well.  Patient had diverticulitis and required partial colectomy and eventually required 4 subsequent surgeries.   The history is provided by the patient.    Past Medical History:  Diagnosis Date  . Acid reflux   . Arthritis   . Cancer ([place])    kidney  . Cataract   . Chiari malformation type I ([place])   . Complication of anesthesia    Hard To Awaken  . COPD (chronic obstructive pulmonary disease) ([place])   . Diverticulitis   . Headache   . Pneumonia   . Stroke ([place])   . Subclavian artery stenosis, left ([facility])    s/p angioplasty/stent 04/25/08    Patient Active Problem List   Diagnosis Date Noted  . CAP (community acquired pneumonia) 06/28/2017  . New onset type 2 diabetes mellitus ([place]) 06/28/2017  . Leukocytosis 06/28/2017  . COPD exacerbation ([place]) 06/26/2017  . Acute hypoxemic respiratory failure ([place]) 06/26/2017  . GERD (gastroesophageal reflux disease) 06/26/2017  . Tobacco abuse 06/26/2017  . COPD UNSPECIFIED 04/29/2009  . DYSLIPIDEMIA 03/10/2009  . 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 PRECORDIAL PAIN 02/04/2009  . TOBACCO ABUSE 01/23/2009  .  DEPRESSION 01/23/2009    Past Surgical History:  Procedure Laterality Date  . ABDOMINAL HYSTERECTOMY     partial  . BREAST SURGERY     left lumpectomy  . carotidendarterectomy    . COLON SURGERY    . EYE SURGERY    . INNER EAR SURGERY     right  . LUMBAR LAMINECTOMY/DECOMPRESSION MICRODISCECTOMY N/A 08/19/2016   Procedure: LUMBAR 4-5 DECOMPRESSION;  Surgeon: Phylliss Bob, MD;  Location: [facility];  Service: Orthopedics;  Laterality: N/A;  LUMBAR 4-5 DECOMPRESSION REQUESTED TIME 2.5 HRS   . NEPHRECTOMY     left  . PARTIAL COLECTOMY    . RHINOPLASTY    . TONSILLECTOMY    . TUBAL LIGATION       OB History   None      Home Medications    Prior to Admission medications   Medication Sig Start Date End Date Taking? Authorizing Provider  aspirin EC 325 MG tablet Take 325 mg by mouth daily.   Yes [provider]  clarithromycin (BIAXIN) 500 MG tablet TK 1 T PO BID 07/19/17  Yes [provider]  CREON 24000-76000 units CPEP TK 1 C PO TID WITH MEALS AND WITH EVENING SNACKS 06/15/17  Yes [provider]  Melatonin 3 MG TABS Take 3 mg by mouth at bedtime.   Yes [provider]  nicotine (NICODERM CQ - DOSED IN MG/24 HOURS)  21 mg/24hr patch APP 1 PA EXT TO THE SKIN D 07/20/17  Yes [provider]  ondansetron (ZOFRAN) 8 MG tablet TK 1 T PO TID PRN NAUSEA AND VOMITING 07/20/17  Yes [provider]  promethazine (PHENERGAN) 25 MG tablet TK 1/2 T PO BID PRF NAUSEA 05/13/17  Yes [provider]  SYMBICORT 80-4.5 MCG/ACT inhaler INHALE 2 PUFFS PO BID PRN FOR SOB AND WHEEZING 07/20/17  Yes [provider]  traMADol (ULTRAM) 50 MG tablet Take 50 mg by mouth every 6 (six) hours as needed for severe pain.   Yes [provider]  acetaminophen (TYLENOL) 325 MG tablet Take 2 tablets (650 mg total) by mouth every 6 (six) hours as needed for mild pain (or Fever >/= 101). 07/01/17   Debbe Odea, MD  albuterol (PROVENTIL) (2.5  MG/3ML) 0.083% nebulizer solution Take 3 mLs (2.5 mg total) by nebulization every 2 (two) hours as needed for wheezing or shortness of breath. 07/01/17   Debbe Odea, MD  dextromethorphan-guaiFENesin (MUCINEX DM) 30-600 MG 12hr tablet Take 1 tablet by mouth 2 (two) times daily. Patient not taking: Reported on 07/28/2017 07/01/17   Debbe Odea, MD  diphenhydrAMINE (BENADRYL) 25 MG tablet Take 25 mg by mouth 2 (two) times daily as needed (allergic reactions).    [provider]  docusate sodium (COLACE) 100 MG capsule Take 100 mg by mouth as needed for mild constipation.    [provider]  glucose monitoring kit (FREESTYLE) monitoring kit 1 each by Does not apply route 4 (four) times daily - after meals and at bedtime. 1 month Diabetic Testing Supplies for QAC-QHS accuchecks. 07/01/17   Debbe Odea, MD  ipratropium-albuterol (DUONEB) 0.5-2.5 (3) MG/3ML SOLN Take 3 mLs by nebulization every 6 (six) hours as needed. Patient taking differently: Take 3 mLs by nebulization every 6 (six) hours as needed (sob and wheezing).  07/01/17   Debbe Odea, MD  levofloxacin (LEVAQUIN) 750 MG tablet Take 1 tablet (750 mg total) by mouth daily. Patient not taking: Reported on 07/21/2017 07/01/17   Debbe Odea, MD  nicotine (NICODERM CQ) 14 mg/24hr patch Place 1 patch (14 mg total) onto the skin daily. Start when the 21 mg patches are finished Patient not taking: Reported on 07/22/2017 07/01/17   Debbe Odea, MD  nicotine (NICODERM CQ) 7 mg/24hr patch Place 1 patch (7 mg total) onto the skin daily. Patient not taking: Reported on 07/24/2017 07/01/17   Debbe Odea, MD  PROAIR HFA 108 [phone] Base) MCG/ACT inhaler INL 1 INHALATION PO QID PRN FOR SOB AND WHEEZING 07/19/17   [provider]    Family History Family History  Problem Relation Age of Onset  . Lung cancer Father     Social History Social History   Tobacco Use  . Smoking status: Current Every Day Smoker    Packs/day: 1.00     Types: Cigarettes  . Smokeless tobacco: Never Used  Substance Use Topics  . Alcohol use: No  . Drug use: No     Allergies   Iohexol; Sodium hypochlorite; Penicillins; Hydrocodone; and Tape   Review of Systems Review of Systems  Cardiovascular: Positive for chest pain.  All other systems reviewed and are negative.    Physical Exam Updated Vital Signs BP (!) 143/91 (BP Location: Right Arm)   Pulse (!) 114   Temp 98.1 F (36.7 C) (Oral)   Resp 18   Wt 51.3 kg (113 lb)   SpO2 97%   BMI 20.67 kg/m  Physical Exam  Constitutional: She is oriented to person, place, and time.  Chronically ill   HENT:  Head: Normocephalic.  Eyes: Pupils are equal, round, and reactive to light. EOM are normal.  Neck: Normal range of motion.  Cardiovascular: Normal rate, regular rhythm, intact distal pulses and normal pulses.  Pulmonary/Chest: Effort normal.  Diminished bilateral bases   Abdominal: Soft.  Distended, + epigastric tenderness   Musculoskeletal: Normal range of motion.       Right lower leg: Normal.       Left lower leg: Normal.  Neurological: She is alert and oriented to person, place, and time.  Skin: Skin is warm.  Psychiatric: She has a normal mood and affect. Her behavior is normal.  Nursing note and vitals reviewed.    ED Treatments / Results  Labs (all labs ordered are listed, but only abnormal results are displayed) Labs Reviewed  COMPREHENSIVE METABOLIC PANEL - Abnormal; Notable for the following components:      Result Value   Glucose, Bld 118 (*)    Albumin 2.9 (*)    AST 193 (*)    ALT 129 (*)    Alkaline Phosphatase 518 (*)    All other components within normal limits  LIPASE, BLOOD - Abnormal; Notable for the following components:   Lipase 99 (*)    All other components within normal limits  I-STAT CHEM 8, ED - Abnormal; Notable for the following components:   Glucose, Bld 116 (*)    Calcium, Ion 1.12 (*)    Hemoglobin 15.6 (*)    All other  components within normal limits  I-STAT CG4 LACTIC ACID, ED - Abnormal; Notable for the following components:   Lactic Acid, Venous 2.27 (*)    All other components within normal limits  TROPONIN I  CBC WITH DIFFERENTIAL/PLATELET  I-STAT TROPONIN, ED    EKG EKG Interpretation  Date/Time:  Saturday July 23 2017 20:00:50 EDT Ventricular Rate:  113 PR Interval:    QRS Duration: 69 QT Interval:  315 QTC Calculation: 428 R Axis:   77 Text Interpretation:  Sinus tachycardia Atrial premature complex Right atrial enlargement Baseline wander in lead(s) I III aVR V1 No significant change since last tracing Confirmed by Wandra Arthurs [phone]) on 07/25/2017 8:18:45 PM   Radiology Ct Abdomen Pelvis Wo Contrast  Result Date: 07/26/2017 CLINICAL DATA:  Mid chest and back pain. Patient was recently treated for pneumonia and has L4 and L5 fractures coughing. EXAM: CT CHEST, ABDOMEN AND PELVIS WITHOUT CONTRAST TECHNIQUE: Multidetector CT imaging of the chest, abdomen and pelvis was performed following the standard protocol without IV contrast. COMPARISON:  CXR from the same day, 06/29/2017 CXR and 01/31/2015 CT AP FINDINGS: CT CHEST FINDINGS Cardiovascular: Conventional branch pattern of the great vessels with atherosclerosis of the proximal great vessels. Mild atherosclerosis of the nonaneurysmal thoracic aorta measuring up to 3.3 cm along the ascending portion. Nondilated main pulmonary artery. Heart size is normal without pericardial effusion. Minimal coronary arteriosclerosis along the distal LAD. Mediastinum/Nodes: Mediastinal adenopathy is identified with largest lymph node right lower paratracheal measuring 2.2 cm in short axis. A 1.1 cm AP window lymph node is also noted. The trachea and mainstem bronchi are patent. Occlusion of the right lower lobe bronchus and branch vessels is noted. Lungs/Pleura: There is a confluent area of partially atelectatic/partially collapsed right lower lobe with displacement  of the major fissure and inferior displacement of the right hilum from volume loss. This is likely related to obstruction  of right lower lobe pulmonary bronchi possibly from mucus inspissation given somewhat tubular hypodensity new noted within the visualized bronchi. Alternatively an endobronchial lesion is not excluded or unlikely an extrinsic infrahilar mass causing extrinsic impression given the subtle tubular soft tissue opacities noted within the expected bronchi. Musculoskeletal: Superior endplate Schmorl's node of T10. No aggressive osseous lesions. CT ABDOMEN PELVIS FINDINGS Hepatobiliary: Inhomogeneous masslike hypodensities are noted of the liver concerning for liver lesions possibly metastatic given findings in the chest. No biliary dilatation is identified. The gallbladder is unremarkable. Pancreas: Normal Spleen: Normal size spleen without focal mass. Adrenals/Urinary Tract: Status post left nephrectomy. Right adrenal gland and kidney are nonacute with compensatory hypertrophy of the right kidney. Punctate renal pelvic calcifications seen on the right possibly vascular or a nonobstructing calculus. Left adrenal gland is not well visualized and may be surgically absent as well. Nodularity in the expected location of the adrenal gland is felt to represent partial volume averaging of the tail of the pancreas. Stomach/Bowel: The stomach is decompressed in appearance. Normal small bowel rotation without obstruction or inflammation. The distal and terminal ileum are normal. Normal appearing appendix is noted. Increased stool burden within the colon compatible constipation. No large bowel inflammation. Partial colectomy on the left with sutures noted in the region of the rectosigmoid. Vascular/Lymphatic: Moderate to marked atherosclerosis of the abdominal aorta and branch vessels. Small retroperitoneal lymph nodes are identified without pathologic enlargement. Reproductive: Hysterectomy.  No adnexal mass.  Other: No free air nor free fluid. Musculoskeletal: Degenerative disc disease L1 through L4. No aggressive osseous lesions identified. IMPRESSION: 1. Partial right lower lobe collapse/atelectasis with displacement of the right major fissure and caudal displacement of the right hilum. Differential considerations for this finding may be secondary to mucous impaction. A right infrahilar mass or endobronchial lesion causing the atelectasis are not entirely excluded. Mediastinal lymphadenopathy is also noted concerning for possible metastatic disease though reactive lymph nodes might also account for this. 2. Subtle hypodensity masslike abnormalities of the unenhanced liver worrisome for possible metastatic disease in light of the pulmonary findings. CT or MRI with IV contrast would be preferable for further assessment. 3. Left nephrectomy. Compensatory hypertrophy of the right kidney with nonobstructing interpolar calculus possibly vascular. 4. Partial left colectomy. 5. Moderate atherosclerosis of the abdominal aorta and branch vessels. Electronically Signed   By: Ashley Royalty M.D.   On: 08/01/2017 21:33   Dg Chest 2 View  Result Date: 07/06/2017 CLINICAL DATA:  Chest pain EXAM: CHEST - 2 VIEW COMPARISON:  June 29, 2017 FINDINGS: There is extensive right lower lobe airspace consolidation consistent with pneumonia. Lungs elsewhere clear. Heart size and pulmonary vascularity are normal. No adenopathy. There is an old healed fracture of the lateral right sixth rib. There is aortic atherosclerosis. IMPRESSION: Extensive consolidation right lower lobe consistent with pneumonia. Lungs elsewhere clear. Heart size normal. There is aortic atherosclerosis. Aortic Atherosclerosis (ICD10-I70.0). Electronically Signed   By: Lowella Grip III M.D.   On: 07/05/2017 20:57   Ct Chest Wo Contrast  Result Date: 07/08/2017 CLINICAL DATA:  Mid chest and back pain. Patient was recently treated for pneumonia and has L4 and L5  fractures coughing. EXAM: CT CHEST, ABDOMEN AND PELVIS WITHOUT CONTRAST TECHNIQUE: Multidetector CT imaging of the chest, abdomen and pelvis was performed following the standard protocol without IV contrast. COMPARISON:  CXR from the same day, 06/29/2017 CXR and 01/31/2015 CT AP FINDINGS: CT CHEST FINDINGS Cardiovascular: Conventional branch pattern of the great vessels with atherosclerosis of  the proximal great vessels. Mild atherosclerosis of the nonaneurysmal thoracic aorta measuring up to 3.3 cm along the ascending portion. Nondilated main pulmonary artery. Heart size is normal without pericardial effusion. Minimal coronary arteriosclerosis along the distal LAD. Mediastinum/Nodes: Mediastinal adenopathy is identified with largest lymph node right lower paratracheal measuring 2.2 cm in short axis. A 1.1 cm AP window lymph node is also noted. The trachea and mainstem bronchi are patent. Occlusion of the right lower lobe bronchus and branch vessels is noted. Lungs/Pleura: There is a confluent area of partially atelectatic/partially collapsed right lower lobe with displacement of the major fissure and inferior displacement of the right hilum from volume loss. This is likely related to obstruction of right lower lobe pulmonary bronchi possibly from mucus inspissation given somewhat tubular hypodensity new noted within the visualized bronchi. Alternatively an endobronchial lesion is not excluded or unlikely an extrinsic infrahilar mass causing extrinsic impression given the subtle tubular soft tissue opacities noted within the expected bronchi. Musculoskeletal: Superior endplate Schmorl's node of T10. No aggressive osseous lesions. CT ABDOMEN PELVIS FINDINGS Hepatobiliary: Inhomogeneous masslike hypodensities are noted of the liver concerning for liver lesions possibly metastatic given findings in the chest. No biliary dilatation is identified. The gallbladder is unremarkable. Pancreas: Normal Spleen: Normal size  spleen without focal mass. Adrenals/Urinary Tract: Status post left nephrectomy. Right adrenal gland and kidney are nonacute with compensatory hypertrophy of the right kidney. Punctate renal pelvic calcifications seen on the right possibly vascular or a nonobstructing calculus. Left adrenal gland is not well visualized and may be surgically absent as well. Nodularity in the expected location of the adrenal gland is felt to represent partial volume averaging of the tail of the pancreas. Stomach/Bowel: The stomach is decompressed in appearance. Normal small bowel rotation without obstruction or inflammation. The distal and terminal ileum are normal. Normal appearing appendix is noted. Increased stool burden within the colon compatible constipation. No large bowel inflammation. Partial colectomy on the left with sutures noted in the region of the rectosigmoid. Vascular/Lymphatic: Moderate to marked atherosclerosis of the abdominal aorta and branch vessels. Small retroperitoneal lymph nodes are identified without pathologic enlargement. Reproductive: Hysterectomy.  No adnexal mass. Other: No free air nor free fluid. Musculoskeletal: Degenerative disc disease L1 through L4. No aggressive osseous lesions identified. IMPRESSION: 1. Partial right lower lobe collapse/atelectasis with displacement of the right major fissure and caudal displacement of the right hilum. Differential considerations for this finding may be secondary to mucous impaction. A right infrahilar mass or endobronchial lesion causing the atelectasis are not entirely excluded. Mediastinal lymphadenopathy is also noted concerning for possible metastatic disease though reactive lymph nodes might also account for this. 2. Subtle hypodensity masslike abnormalities of the unenhanced liver worrisome for possible metastatic disease in light of the pulmonary findings. CT or MRI with IV contrast would be preferable for further assessment. 3. Left nephrectomy.  Compensatory hypertrophy of the right kidney with nonobstructing interpolar calculus possibly vascular. 4. Partial left colectomy. 5. Moderate atherosclerosis of the abdominal aorta and branch vessels. Electronically Signed   By: Ashley Royalty M.D.   On: 07/22/2017 21:33    Procedures Procedures (including critical care time)  Medications Ordered in ED Medications  vancomycin (VANCOCIN) IVPB 1000 mg/200 mL premix (has no administration in time range)  ceFEPIme (MAXIPIME) 2 g in sodium chloride 0.9 % 100 mL IVPB (has no administration in time range)  sodium chloride 0.9 % bolus 1,000 mL (1,000 mLs Intravenous New Bag/Given 07/25/2017 2034)  ondansetron (ZOFRAN) injection 4  mg (4 mg Intravenous Given 07/10/2017 2031)  morphine 4 MG/ML injection 4 mg (4 mg Intravenous Given 07/11/2017 2032)     Initial Impression / Assessment and Plan / ED Course  I have reviewed the triage vital signs and the nursing notes.  Pertinent labs & imaging results that were available during my care of the patient were reviewed by me and considered in my medical decision making (see chart for details).     Wanda James is a 62 y.o. female here with cough, chest pain, abdominal pain. Differential is broad- including ACS vs COPD exacerbation vs recurrent pneumonia vs SBO. Will get labs, CXR, trop. She has anaphylaxis to IV dye so will get CT chest/ab/pel non contrast.   9:46 PM WBC 6.8. But lactate 2.2. LFTs elevated. CT chest/ab/pel showed likely pulmonary mass with pneumonia and possible mets to the liver. I think this is the reason why she has recurrent pneumonias. Given vanc/cefepime. Hospitalist to admit for HCAP, possible lung cancer with mets to liver.    Final Clinical Impressions(s) / ED Diagnoses   Final diagnoses:  None    ED Discharge Orders    None       Drenda Freeze, MD 07/26/2017 2213

## 2017-07-23 NOTE — H&P (Signed)
TRH H&P   Patient Demographics:    Wanda James, is a 62 y.o. female  MRN: [id number]   DOB - 04/27/1955  Admit Date - 07/25/2017  Outpatient Primary MD for the patient is Althisar, Mallie Mussel, PA-C  Referring MD/NP/PA: Shirlyn Goltz  Outpatient Specialists:    Dr. Holley Dexter (urologist)  Patient coming from: home  Chief Complaint  Patient presents with  . Chest Pain      HPI:    Wanda James  is a 62 y.o. female, w h/o renal cell carcinoma, nicotine dep, Copd on home o2, apparently presents with c/o Sharp chest pain of the xyphoid process starting last nite. + sob, wheezing. + cough, mostly dry.  Denies fever, chills, palp, n/v, diarrhea, brbpr, dysuria, hematuria.    In Ed,  CT scan chest/ abd  IMPRESSION: 1. Partial right lower lobe collapse/atelectasis with displacement of the right major fissure and caudal displacement of the right hilum. Differential considerations for this finding may be secondary to mucous impaction. A right infrahilar mass or endobronchial lesion causing the atelectasis are not entirely excluded. Mediastinal lymphadenopathy is also noted concerning for possible metastatic disease though reactive lymph nodes might also account for this. 2. Subtle hypodensity masslike abnormalities of the unenhanced liver worrisome for possible metastatic disease in light of the pulmonary findings. CT or MRI with IV contrast would be preferable for further assessment. 3. Left nephrectomy. Compensatory hypertrophy of the right kidney with nonobstructing interpolar calculus possibly vascular. 4. Partial left colectomy. 5. Moderate atherosclerosis of the abdominal aorta and branch vessels.   Trop <0.03 Wbc 6.8, Hgb 14.5, Plt 304 Na 138, K 3.7, Bun 11, Creatinine 0.81 Ast 193, Alt 129, Alk phos 518, T. Bili 0.3 Lipase 99  Lactic acid 2.27  Pt will be admitted for chest  pain, Copd exacerbation, Hcap, and abnormal liver function     Review of systems:    In addition to the HPI above,  No Fever-chills, No Headache, No changes with Vision or hearing, No problems swallowing food or Liquids,  No Abdominal pain, No Nausea or Vommitting, Bowel movements are regular, No Blood in stool or Urine, No dysuria, No new skin rashes or bruises, No new joints pains-aches,  No new weakness, tingling, numbness in any extremity, No recent weight gain or loss, No polyuria, polydypsia or polyphagia, No significant Mental Stressors.  A full 10 point Review of Systems was done, except as stated above, all other Review of Systems were negative.   With Past History of the following :    Past Medical History:  Diagnosis Date  . Acid reflux   . Arthritis   . Cancer ([place])    kidney  . Cataract   . Chiari malformation type I ([place])   . Complication of anesthesia    Hard To Awaken  . COPD (chronic obstructive pulmonary disease) ([place])   . Diverticulitis   .  Headache   . Pneumonia   . Stroke ([place])   . Subclavian artery stenosis, left (HCC)    s/p angioplasty/stent 04/25/08      Past Surgical History:  Procedure Laterality Date  . ABDOMINAL HYSTERECTOMY     partial  . BREAST SURGERY     left lumpectomy  . carotidendarterectomy    . COLON SURGERY    . EYE SURGERY    . INNER EAR SURGERY     right  . LUMBAR LAMINECTOMY/DECOMPRESSION MICRODISCECTOMY N/A 08/19/2016   Procedure: LUMBAR 4-5 DECOMPRESSION;  Surgeon: Phylliss Bob, MD;  Location: [facility];  Service: Orthopedics;  Laterality: N/A;  LUMBAR 4-5 DECOMPRESSION REQUESTED TIME 2.5 HRS   . NEPHRECTOMY     left  . PARTIAL COLECTOMY    . RHINOPLASTY    . TONSILLECTOMY    . TUBAL LIGATION        Social History:     Social History   Tobacco Use  . Smoking status: Current Every Day Smoker    Packs/day: 1.00    Types: Cigarettes  . Smokeless tobacco: Never Used  Substance Use Topics  . Alcohol  use: No     Lives - at home  Mobility - walks by self   Family History :     Family History  Problem Relation Age of Onset  . Lung cancer Father       Home Medications:   Prior to Admission medications   Medication Sig Start Date End Date Taking? Authorizing Provider  aspirin EC 325 MG tablet Take 325 mg by mouth daily.   Yes [provider]  clarithromycin (BIAXIN) 500 MG tablet TK 1 T PO BID 07/19/17  Yes [provider]  CREON 24000-76000 units CPEP TK 1 C PO TID WITH MEALS AND WITH EVENING SNACKS 06/15/17  Yes [provider]  Melatonin 3 MG TABS Take 3 mg by mouth at bedtime.   Yes [provider]  nicotine (NICODERM CQ - DOSED IN MG/24 HOURS) 21 mg/24hr patch APP 1 PA EXT TO THE SKIN D 07/20/17  Yes [provider]  ondansetron (ZOFRAN) 8 MG tablet TK 1 T PO TID PRN NAUSEA AND VOMITING 07/20/17  Yes [provider]  promethazine (PHENERGAN) 25 MG tablet TK 1/2 T PO BID PRF NAUSEA 05/13/17  Yes [provider]  SYMBICORT 80-4.5 MCG/ACT inhaler INHALE 2 PUFFS PO BID PRN FOR SOB AND WHEEZING 07/20/17  Yes [provider]  traMADol (ULTRAM) 50 MG tablet Take 50 mg by mouth every 6 (six) hours as needed for severe pain.   Yes [provider]  acetaminophen (TYLENOL) 325 MG tablet Take 2 tablets (650 mg total) by mouth every 6 (six) hours as needed for mild pain (or Fever >/= 101). 07/01/17   Debbe Odea, MD  albuterol (PROVENTIL) (2.5 MG/3ML) 0.083% nebulizer solution Take 3 mLs (2.5 mg total) by nebulization every 2 (two) hours as needed for wheezing or shortness of breath. 07/01/17   Debbe Odea, MD  dextromethorphan-guaiFENesin (MUCINEX DM) 30-600 MG 12hr tablet Take 1 tablet by mouth 2 (two) times daily. Patient not taking: Reported on 07/22/2017 07/01/17   Debbe Odea, MD  diphenhydrAMINE (BENADRYL) 25 MG tablet Take 25 mg by mouth 2 (two) times daily as needed (allergic reactions).    [provider]  docusate sodium (COLACE) 100 MG capsule Take 100 mg by mouth as needed for mild constipation.    [provider]  glucose monitoring kit (FREESTYLE) monitoring  kit 1 each by Does not apply route 4 (four) times daily - after meals and at bedtime. 1 month Diabetic Testing Supplies for QAC-QHS accuchecks. 07/01/17   Debbe Odea, MD  ipratropium-albuterol (DUONEB) 0.5-2.5 (3) MG/3ML SOLN Take 3 mLs by nebulization every 6 (six) hours as needed. Patient taking differently: Take 3 mLs by nebulization every 6 (six) hours as needed (sob and wheezing).  07/01/17   Debbe Odea, MD  levofloxacin (LEVAQUIN) 750 MG tablet Take 1 tablet (750 mg total) by mouth daily. Patient not taking: Reported on 07/11/2017 07/01/17   Debbe Odea, MD  nicotine (NICODERM CQ) 14 mg/24hr patch Place 1 patch (14 mg total) onto the skin daily. Start when the 21 mg patches are finished Patient not taking: Reported on 07/18/2017 07/01/17   Debbe Odea, MD  nicotine (NICODERM CQ) 7 mg/24hr patch Place 1 patch (7 mg total) onto the skin daily. Patient not taking: Reported on 07/08/2017 07/01/17   Debbe Odea, MD  PROAIR HFA [phone] Base) MCG/ACT inhaler INL 1 INHALATION PO QID PRN FOR SOB AND WHEEZING 07/19/17   [provider]     Allergies:     Allergies  Allergen Reactions  . Iohexol Anaphylaxis     Desc: ANAPHALAXIS-ARS ON 08-22-04 Went into a coma  . Sodium Hypochlorite Other (See Comments)    Solution used to place on packing burned very bad, patient states she can't use it any more due to this/thjcma  . Penicillins Hives and Swelling    Has patient had a PCN reaction causing immediate rash, facial/tongue/throat swelling, SOB or lightheadedness with hypotension: No Has patient had a PCN reaction causing severe rash involving mucus membranes or skin necrosis: Yes Has patient had a PCN reaction that required hospitalization: No Has patient had a PCN reaction occurring within the last 10  years: No If all of the above answers are "NO", then may proceed with Cephalosporin use.   Marland Kitchen Hydrocodone Nausea And Vomiting  . Tape Rash    Itchy rash     Physical Exam:   Vitals  Blood pressure (!) 143/91, pulse 96, temperature 98.1 F (36.7 C), temperature source Oral, resp. rate (!) 26, weight 51.3 kg (113 lb), SpO2 98 %.   1. General  lying in bed in NAD,   2. Normal affect and insight, Not Suicidal or Homicidal, Awake Alert, Oriented X 3.  3. No F.N deficits, ALL C.Nerves Intact, Strength 5/5 all 4 extremities, Sensation intact all 4 extremities, Plantars down going.  4. Ears and Eyes appear Normal, Conjunctivae clear, PERRLA. Moist Oral Mucosa.  5. Supple Neck, No JVD, No cervical lymphadenopathy appriciated, No Carotid Bruits.  6. Symmetrical Chest wall movement,slight crackles left lower lung, slight exp wheezing. Slight decrease in bs right lower lung  7. RRR, No Gallops, Rubs or Murmurs, No Parasternal Heave.  8. Positive Bowel Sounds, Abdomen Soft, No tenderness, No organomegaly appriciated,No rebound -guarding or rigidity.  9.  No Cyanosis, Normal Skin Turgor, No Skin Rash or Bruise.  10. Good muscle tone,  joints appear normal , no effusions, Normal ROM.  11. No Palpable Lymph Nodes in Neck or Axillae      Data Review:    CBC Recent Labs  Lab 07/11/2017 2025 07/28/2017 2026  WBC 6.8  --   HGB 14.5 15.6*  HCT 43.7 46.0  PLT 304  --   MCV 88.6  --   MCH 29.4  --   MCHC 33.2  --   RDW 14.7  --  LYMPHSABS 0.8  --   MONOABS 0.8  --   EOSABS 0.2  --   BASOSABS 0.0  --    ------------------------------------------------------------------------------------------------------------------  Chemistries  Recent Labs  Lab 07/11/2017 2025 07/20/2017 2026  NA 138 136  K 3.7 3.7  CL 99 99  CO2 26  --   GLUCOSE 118* 116*  BUN 11 9  CREATININE 0.81 0.80  CALCIUM 9.1  --   AST 193*  --   ALT 129*  --   ALKPHOS 518*  --   BILITOT 0.3  --     ------------------------------------------------------------------------------------------------------------------ estimated creatinine clearance is 56.9 mL/min (by C-G formula based on SCr of 0.8 mg/dL). ------------------------------------------------------------------------------------------------------------------ No results for input(s): TSH, T4TOTAL, T3FREE, THYROIDAB in the last 72 hours.  Invalid input(s): FREET3  Coagulation profile No results for input(s): INR, PROTIME in the last 168 hours. ------------------------------------------------------------------------------------------------------------------- No results for input(s): DDIMER in the last 72 hours. -------------------------------------------------------------------------------------------------------------------  Cardiac Enzymes Recent Labs  Lab 07/17/2017 2025  TROPONINI <0.03   ------------------------------------------------------------------------------------------------------------------    Component Value Date/Time   BNP 25.0 06/26/2017 1506     ---------------------------------------------------------------------------------------------------------------  Urinalysis    Component Value Date/Time   COLORURINE YELLOW 08/16/2016 1441   APPEARANCEUR CLEAR 08/16/2016 1441   LABSPEC 1.021 08/16/2016 1441   PHURINE 5.0 08/16/2016 1441   GLUCOSEU NEGATIVE 08/16/2016 1441   HGBUR MODERATE (A) 08/16/2016 1441   BILIRUBINUR NEGATIVE 08/16/2016 1441   KETONESUR NEGATIVE 08/16/2016 1441   PROTEINUR 100 (A) 08/16/2016 1441   UROBILINOGEN 0.2 05/29/2008 1030   NITRITE NEGATIVE 08/16/2016 1441   LEUKOCYTESUR TRACE (A) 08/16/2016 1441    ----------------------------------------------------------------------------------------------------------------   Imaging Results:    Ct Abdomen Pelvis Wo Contrast  Result Date: 07/28/2017 CLINICAL DATA:  Mid chest and back pain. Patient was recently treated for  pneumonia and has L4 and L5 fractures coughing. EXAM: CT CHEST, ABDOMEN AND PELVIS WITHOUT CONTRAST TECHNIQUE: Multidetector CT imaging of the chest, abdomen and pelvis was performed following the standard protocol without IV contrast. COMPARISON:  CXR from the same day, 06/29/2017 CXR and 01/31/2015 CT AP FINDINGS: CT CHEST FINDINGS Cardiovascular: Conventional branch pattern of the great vessels with atherosclerosis of the proximal great vessels. Mild atherosclerosis of the nonaneurysmal thoracic aorta measuring up to 3.3 cm along the ascending portion. Nondilated main pulmonary artery. Heart size is normal without pericardial effusion. Minimal coronary arteriosclerosis along the distal LAD. Mediastinum/Nodes: Mediastinal adenopathy is identified with largest lymph node right lower paratracheal measuring 2.2 cm in short axis. A 1.1 cm AP window lymph node is also noted. The trachea and mainstem bronchi are patent. Occlusion of the right lower lobe bronchus and branch vessels is noted. Lungs/Pleura: There is a confluent area of partially atelectatic/partially collapsed right lower lobe with displacement of the major fissure and inferior displacement of the right hilum from volume loss. This is likely related to obstruction of right lower lobe pulmonary bronchi possibly from mucus inspissation given somewhat tubular hypodensity new noted within the visualized bronchi. Alternatively an endobronchial lesion is not excluded or unlikely an extrinsic infrahilar mass causing extrinsic impression given the subtle tubular soft tissue opacities noted within the expected bronchi. Musculoskeletal: Superior endplate Schmorl's node of T10. No aggressive osseous lesions. CT ABDOMEN PELVIS FINDINGS Hepatobiliary: Inhomogeneous masslike hypodensities are noted of the liver concerning for liver lesions possibly metastatic given findings in the chest. No biliary dilatation is identified. The gallbladder is unremarkable. Pancreas:  Normal Spleen: Normal size spleen without focal mass. Adrenals/Urinary Tract: Status post left nephrectomy.  Right adrenal gland and kidney are nonacute with compensatory hypertrophy of the right kidney. Punctate renal pelvic calcifications seen on the right possibly vascular or a nonobstructing calculus. Left adrenal gland is not well visualized and may be surgically absent as well. Nodularity in the expected location of the adrenal gland is felt to represent partial volume averaging of the tail of the pancreas. Stomach/Bowel: The stomach is decompressed in appearance. Normal small bowel rotation without obstruction or inflammation. The distal and terminal ileum are normal. Normal appearing appendix is noted. Increased stool burden within the colon compatible constipation. No large bowel inflammation. Partial colectomy on the left with sutures noted in the region of the rectosigmoid. Vascular/Lymphatic: Moderate to marked atherosclerosis of the abdominal aorta and branch vessels. Small retroperitoneal lymph nodes are identified without pathologic enlargement. Reproductive: Hysterectomy.  No adnexal mass. Other: No free air nor free fluid. Musculoskeletal: Degenerative disc disease L1 through L4. No aggressive osseous lesions identified. IMPRESSION: 1. Partial right lower lobe collapse/atelectasis with displacement of the right major fissure and caudal displacement of the right hilum. Differential considerations for this finding may be secondary to mucous impaction. A right infrahilar mass or endobronchial lesion causing the atelectasis are not entirely excluded. Mediastinal lymphadenopathy is also noted concerning for possible metastatic disease though reactive lymph nodes might also account for this. 2. Subtle hypodensity masslike abnormalities of the unenhanced liver worrisome for possible metastatic disease in light of the pulmonary findings. CT or MRI with IV contrast would be preferable for further assessment.  3. Left nephrectomy. Compensatory hypertrophy of the right kidney with nonobstructing interpolar calculus possibly vascular. 4. Partial left colectomy. 5. Moderate atherosclerosis of the abdominal aorta and branch vessels. Electronically Signed   By: Ashley Royalty M.D.   On: 07/20/2017 21:33   Dg Chest 2 View  Result Date: 07/12/2017 CLINICAL DATA:  Chest pain EXAM: CHEST - 2 VIEW COMPARISON:  June 29, 2017 FINDINGS: There is extensive right lower lobe airspace consolidation consistent with pneumonia. Lungs elsewhere clear. Heart size and pulmonary vascularity are normal. No adenopathy. There is an old healed fracture of the lateral right sixth rib. There is aortic atherosclerosis. IMPRESSION: Extensive consolidation right lower lobe consistent with pneumonia. Lungs elsewhere clear. Heart size normal. There is aortic atherosclerosis. Aortic Atherosclerosis (ICD10-I70.0). Electronically Signed   By: Lowella Grip III M.D.   On: 07/08/2017 20:57   Ct Chest Wo Contrast  Result Date: 07/26/2017 CLINICAL DATA:  Mid chest and back pain. Patient was recently treated for pneumonia and has L4 and L5 fractures coughing. EXAM: CT CHEST, ABDOMEN AND PELVIS WITHOUT CONTRAST TECHNIQUE: Multidetector CT imaging of the chest, abdomen and pelvis was performed following the standard protocol without IV contrast. COMPARISON:  CXR from the same day, 06/29/2017 CXR and 01/31/2015 CT AP FINDINGS: CT CHEST FINDINGS Cardiovascular: Conventional branch pattern of the great vessels with atherosclerosis of the proximal great vessels. Mild atherosclerosis of the nonaneurysmal thoracic aorta measuring up to 3.3 cm along the ascending portion. Nondilated main pulmonary artery. Heart size is normal without pericardial effusion. Minimal coronary arteriosclerosis along the distal LAD. Mediastinum/Nodes: Mediastinal adenopathy is identified with largest lymph node right lower paratracheal measuring 2.2 cm in short axis. A 1.1 cm AP window  lymph node is also noted. The trachea and mainstem bronchi are patent. Occlusion of the right lower lobe bronchus and branch vessels is noted. Lungs/Pleura: There is a confluent area of partially atelectatic/partially collapsed right lower lobe with displacement of the major fissure and inferior displacement  of the right hilum from volume loss. This is likely related to obstruction of right lower lobe pulmonary bronchi possibly from mucus inspissation given somewhat tubular hypodensity new noted within the visualized bronchi. Alternatively an endobronchial lesion is not excluded or unlikely an extrinsic infrahilar mass causing extrinsic impression given the subtle tubular soft tissue opacities noted within the expected bronchi. Musculoskeletal: Superior endplate Schmorl's node of T10. No aggressive osseous lesions. CT ABDOMEN PELVIS FINDINGS Hepatobiliary: Inhomogeneous masslike hypodensities are noted of the liver concerning for liver lesions possibly metastatic given findings in the chest. No biliary dilatation is identified. The gallbladder is unremarkable. Pancreas: Normal Spleen: Normal size spleen without focal mass. Adrenals/Urinary Tract: Status post left nephrectomy. Right adrenal gland and kidney are nonacute with compensatory hypertrophy of the right kidney. Punctate renal pelvic calcifications seen on the right possibly vascular or a nonobstructing calculus. Left adrenal gland is not well visualized and may be surgically absent as well. Nodularity in the expected location of the adrenal gland is felt to represent partial volume averaging of the tail of the pancreas. Stomach/Bowel: The stomach is decompressed in appearance. Normal small bowel rotation without obstruction or inflammation. The distal and terminal ileum are normal. Normal appearing appendix is noted. Increased stool burden within the colon compatible constipation. No large bowel inflammation. Partial colectomy on the left with sutures noted  in the region of the rectosigmoid. Vascular/Lymphatic: Moderate to marked atherosclerosis of the abdominal aorta and branch vessels. Small retroperitoneal lymph nodes are identified without pathologic enlargement. Reproductive: Hysterectomy.  No adnexal mass. Other: No free air nor free fluid. Musculoskeletal: Degenerative disc disease L1 through L4. No aggressive osseous lesions identified. IMPRESSION: 1. Partial right lower lobe collapse/atelectasis with displacement of the right major fissure and caudal displacement of the right hilum. Differential considerations for this finding may be secondary to mucous impaction. A right infrahilar mass or endobronchial lesion causing the atelectasis are not entirely excluded. Mediastinal lymphadenopathy is also noted concerning for possible metastatic disease though reactive lymph nodes might also account for this. 2. Subtle hypodensity masslike abnormalities of the unenhanced liver worrisome for possible metastatic disease in light of the pulmonary findings. CT or MRI with IV contrast would be preferable for further assessment. 3. Left nephrectomy. Compensatory hypertrophy of the right kidney with nonobstructing interpolar calculus possibly vascular. 4. Partial left colectomy. 5. Moderate atherosclerosis of the abdominal aorta and branch vessels. Electronically Signed   By: Ashley Royalty M.D.   On: 07/26/2017 21:33   ST at 113, nl axis, no st-t changes c/w ischemia   Assessment & Plan:    Principal Problem:   Chest pain Active Problems:   COPD exacerbation (HCC)    Chest pain, Tachycardia Tele Trop I q6h x3 D dimer, if positive CTA chest r/o PE Check cardiac echo   RLL collapse  ? 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  Code Status FULL CODE  Likely DC to  home  Condition GUARDED    Consults called:  none  Admission status:  inpatient  Time spent in minutes : 70   Jani Gravel M.D on 07/27/2017 at 10:28 PM  Between 7am to 7pm - Pager - [phone]  . After 7pm go to [email or web address] - password [email or web address]  [facility] - Office  [phone]

## 2017-07-23 NOTE — Progress Notes (Signed)
Pharmacy Antibiotic Note  Wanda James is a 62 y.o. female admitted on 07/09/2017 with hx of COPD on 2 L [place] at baseline, here presenting with chest pain, shortness of breath.  Patient states that she was admitted to the hospital about a month ago for COPD exacerbation.   Pharmacy has been consulted for vancomycin dosing for pna.  Plan: Vancomycin 1gm IV x 1 then 750mg  Q24h (AUC 435.3, Scr 0.8) Cefepime 2gm x 1 then 1gm Q8h per MD Follow renal function, cultures and clinical course  Weight: 113 lb (51.3 kg)  Temp (24hrs), Avg:98.1 F (36.7 C), Min:98.1 F (36.7 C), Max:98.1 F (36.7 C)  Recent Labs  Lab 07/13/2017 2025 07/30/2017 2026 07/11/2017 2029 07/04/2017 2249  WBC 6.8  --   --   --   CREATININE 0.81 0.80  --   --   LATICACIDVEN  --   --  2.27* 1.49    Estimated Creatinine Clearance: 56.9 mL/min (by C-G formula based on SCr of 0.8 mg/dL).    Allergies  Allergen Reactions  . Iohexol Anaphylaxis     Desc: ANAPHALAXIS-ARS ON 08-22-04 Went into a coma  . Sodium Hypochlorite Other (See Comments)    Solution used to place on packing burned very bad, patient states she can't use it any more due to this/thjcma  . Penicillins Hives and Swelling    Has patient had a PCN reaction causing immediate rash, facial/tongue/throat swelling, SOB or lightheadedness with hypotension: No Has patient had a PCN reaction causing severe rash involving mucus membranes or skin necrosis: Yes Has patient had a PCN reaction that required hospitalization: No Has patient had a PCN reaction occurring within the last 10 years: No If all of the above answers are "NO", then may proceed with Cephalosporin use.   Marland Kitchen Hydrocodone Nausea And Vomiting  . Tape Rash    Itchy rash    Antimicrobials this admission: 7/20 vanc >> 7/20 cefepime >> Dose adjustments this admission:   Microbiology results: 7/20 BCx:   Thank you for allowing pharmacy to be a part of this patient's care.  Dolly Rias  RPh 07/29/2017, 11:33 PM Pager [phone]

## 2017-07-23 NOTE — ED Triage Notes (Signed)
Pt arrives with c/o pain in the mid chest and mid back area. Feels like "pressure, someone is sitting on it". Pt was recently treated for PNA and has L4 & L5 fractures from coughing. Also has had vomiting/weight loss. Pt has been using 2L O2 continuous.

## 2017-07-24 ENCOUNTER — Inpatient Hospital Stay (HOSPITAL_COMMUNITY): Payer: BLUE CROSS/BLUE SHIELD

## 2017-07-24 LAB — TROPONIN I
Troponin I: <0.03 ng/mL (ref <0.03)
Troponin I: <0.03 ng/mL (ref <0.03)

## 2017-07-24 LAB — COMPREHENSIVE METABOLIC PANEL
ALK PHOS: 479 U/L — AB (ref 38–126)
ALT: 114 U/L — AB (ref 0–44)
AST: 168 U/L — AB (ref 15–41)
Albumin: 2.6 g/dL — ABNORMAL LOW (ref 3.5–5.0)
Anion gap: 9 (ref 5–15)
BUN: 11 mg/dL (ref 8–23)
CALCIUM: 8.6 mg/dL — AB (ref 8.9–10.3)
CO2: 25 mmol/L (ref 22–32)
CREATININE: 0.74 mg/dL (ref 0.44–1.00)
Chloride: 104 mmol/L (ref 98–111)
GFR calc non Af Amer: >60 mL/min (ref >60)
GLUCOSE: 143 mg/dL — AB (ref 70–99)
Potassium: 4.4 mmol/L (ref 3.5–5.1)
Sodium: 138 mmol/L (ref 135–145)
Total Bilirubin: 0.8 mg/dL (ref 0.3–1.2)
Total Protein: 6 g/dL — ABNORMAL LOW (ref 6.5–8.1)

## 2017-07-24 LAB — CBC
HCT: 40.8 % (ref 36.0–46.0)
HEMOGLOBIN: 13.2 g/dL (ref 12.0–15.0)
MCH: 29.1 pg (ref 26.0–34.0)
MCHC: 32.4 g/dL (ref 30.0–36.0)
MCV: 90.1 fL (ref 78.0–100.0)
Platelets: 254 10*3/uL (ref 150–400)
RBC: 4.53 MIL/uL (ref 3.87–5.11)
RDW: 15 % (ref 11.5–15.5)
WBC: 5.9 10*3/uL (ref 4.0–10.5)

## 2017-07-24 LAB — LIPID PANEL
CHOL/HDL RATIO: 3.1 ratio
Cholesterol: 196 mg/dL (ref 0–200)
HDL: 64 mg/dL (ref >40)
LDL CALC: 120 mg/dL — AB (ref 0–99)
TRIGLYCERIDES: 59 mg/dL (ref <150)
VLDL: 12 mg/dL (ref 0–40)

## 2017-07-24 LAB — PROCALCITONIN: Procalcitonin: 1.98 ng/mL

## 2017-07-24 LAB — D-DIMER, QUANTITATIVE: D-Dimer, Quant: 2.86 ug/mL-FEU — ABNORMAL HIGH (ref 0.00–0.50)

## 2017-07-24 LAB — STREP PNEUMONIAE URINARY ANTIGEN: Strep Pneumo Urinary Antigen: NEGATIVE

## 2017-07-24 MED ORDER — ORAL CARE MOUTH RINSE
15.0000 mL | Freq: Two times a day (BID) | OROMUCOSAL | Status: DC
  Administered 2017-07-24 – 2017-08-02 (×11): 15 mL via OROMUCOSAL

## 2017-07-24 MED ORDER — HYDROMORPHONE HCL 1 MG/ML IJ SOLN
0.5000 mg | INTRAMUSCULAR | Status: AC | PRN
  Administered 2017-07-24 (×3): 0.5 mg via INTRAVENOUS
  Filled 2017-07-24 (×5): qty 0.5

## 2017-07-24 MED ORDER — HYDROMORPHONE HCL 1 MG/ML IJ SOLN
0.5000 mg | INTRAMUSCULAR | Status: AC | PRN
  Administered 2017-07-24 – 2017-07-25 (×3): 0.5 mg via INTRAVENOUS
  Filled 2017-07-24 (×3): qty 0.5

## 2017-07-24 MED ORDER — PROMETHAZINE HCL 25 MG/ML IJ SOLN
12.5000 mg | Freq: Four times a day (QID) | INTRAMUSCULAR | Status: DC | PRN
  Administered 2017-07-24 – 2017-07-29 (×3): 12.5 mg via INTRAVENOUS
  Filled 2017-07-24 (×3): qty 1

## 2017-07-24 MED ORDER — GADOBENATE DIMEGLUMINE 529 MG/ML IV SOLN
10.0000 mL | Freq: Once | INTRAVENOUS | Status: AC | PRN
  Administered 2017-07-24: 10 mL via INTRAVENOUS

## 2017-07-24 NOTE — Progress Notes (Addendum)
PROGRESS NOTE  Wanda James LFY:[id number] DOB: 11/30/1955 DOA: 08/02/2017 PCP: Gaye Alken, PA-C  HPI/Recap of past 24 hours: Wanda James  is a 62 y.o. female, w h/o renal cell carcinoma, nicotine dep, Copd on home o2, presents with c/o Sharp chest pain of the xyphoid process starting last nite. + sob, wheezing. + cough, mostly dry.  Denies fever, chills, palp, n/v, diarrhea, brbpr, dysuria, hematuria. Recently discharged on 06/26/17 for CAP/acute COPD exacerbation.  Imagings done on admission revealed possible recurrent RLL PNA vs post obstructive atelectasis, mediastinal lymphadenopathy, and possible liver mass. TRH asked to admit. PCCM consulted.  07/24/17: seen and examined with her husband at her bedside. Admits to dyspnea at rest. No chest pain. Long hx of tobacco use 1-2 PPD for at least 40 years. Quit less than 3 weeks ago after her last hospital admission. S/p Left nephrectomy more than 10 years ago from renal cell carcinoma.   Spoke with oncology. Recommendations for U/S guided biopsy of the liver. Will see the patient once biopsy has resulted for a diagnosis and prognosis. Highly appreciated.   Assessment/Plan: Principal Problem:   Chest pain Active Problems:   COPD exacerbation (HCC)   HCAP (healthcare-associated pneumonia)  Chest pain, most likely pleuritic Low suspicion for cardiac Troponins are flat EKG non specific ST T changes  HCAP, poa CT chest with suspected RLL recurrent PNA vs post obstructive atelectasis PCCM consulted for recommendations C/w broad spectrum IV antibiotics  Newly diagnosed large Right hilar mass with suspicion for malignancy Seen on MRI abdomen 07/24/17 Consult oncology-Dr Gorsuch May require biopsies-Ordered IR US guided biopsies to liver.  Liver metastasis, estimated 100 lesions Seen on MRI abdomen 07/24/17 No bony mets identified  Elevated D dimer Suspect 2/2 to possible malignancy No pulmonary embolus  RLL  collapse/atelectasis/suspected postobstructive atelectasis from large right hilar mass vs others PCCM consulted and following O2 supplement to maintain O2 sat between 88-92% C/w breathing treatments-supportive care  Acute COPD exacerbation 2/2 to HCAP vs others Management as stated above  Type 2 DM A1C 6.8 ISS; regukar accuchecks Avoid hypoglycemia  Chronic back pain C/w tramadol  Hx of tobacco use diorder >40 years 1-2 PPD Quit after her last admission 3 weeks ago  Hx or renal cell carcinoma s/p left nephrectomy >10 years ago Avoid nephrotoxic agents, hypotension or dehydration  Goals of care Palliative care consult to establish goals of care   Code Status: Full  Family Communication: Husband at bedside. All questions answered to his satisfaction.  Disposition Plan: Home in 2-3 days. Will need biopsies and oncology evaluation.   Consultants:  PCCM  Oncology  Procedures:  None  Antimicrobials:  IV cefepime  IV vancomycin  DVT prophylaxis:  sq lovenox daily   Objective: Vitals:   07/24/17 0525 07/24/17 0820 07/24/17 0824 07/24/17 1528  BP: 123/74   115/78  Pulse: 90   94  Resp: 18   20  Temp: 98.3 F (36.8 C)   98 F (36.7 C)  TempSrc: Oral   Oral  SpO2: 96% 94% 94% 95%  Weight:        Intake/Output Summary (Last 24 hours) at 07/24/2017 1614 Last data filed at 07/18/2017 2353 Gross per 24 hour  Intake 1200 ml  Output -  Net 1200 ml   Filed Weights   07/30/2017 1949 07/24/17 0055  Weight: 51.3 kg (113 lb) 50.7 kg (111 lb 12.4 oz)    Exam:  . General: 62 y.o. year-old female well developed well nourished in  no acute distress.  Alert and oriented x3. On 2 and half O2 [place]. . Cardiovascular: Regular rate and rhythm with no rubs or gallops.  No thyromegaly or JVD noted.   Marland Kitchen Respiratory: Mild rales noted bilaterally with no wheezes. Good inspiratory effort. . Abdomen: Soft nontender nondistended with normal bowel sounds x4  quadrants. . Musculoskeletal: No lower extremity edema. 2/4 pulses in all 4 extremities. . Skin: No ulcerative lesions noted or rashes . Psychiatry: Mood is appropriate for condition and setting   Data Reviewed: CBC: Recent Labs  Lab 07/14/2017 2025 08/01/2017 2026 07/24/17 0443  WBC 6.8  --  5.9  NEUTROABS 5.0  --   --   HGB 14.5 15.6* 13.2  HCT 43.7 46.0 40.8  MCV 88.6  --  90.1  PLT 304  --  962   Basic Metabolic Panel: Recent Labs  Lab 07/18/2017 2025 07/20/2017 2026 07/24/17 0443  NA 138 136 138  K 3.7 3.7 4.4  CL 99 99 104  CO2 26  --  25  GLUCOSE 118* 116* 143*  BUN 11 9 11   CREATININE 0.81 0.80 0.74  CALCIUM 9.1  --  8.6*   GFR: Estimated Creatinine Clearance: 56.9 mL/min (by C-G formula based on SCr of 0.74 mg/dL). Liver Function Tests: Recent Labs  Lab 07/17/2017 2025 07/24/17 0443  AST 193* 168*  ALT 129* 114*  ALKPHOS 518* 479*  BILITOT 0.3 0.8  PROT 7.0 6.0*  ALBUMIN 2.9* 2.6*   Recent Labs  Lab 07/28/2017 2025  LIPASE 99*   No results for input(s): AMMONIA in the last 168 hours. Coagulation Profile: No results for input(s): INR, PROTIME in the last 168 hours. Cardiac Enzymes: Recent Labs  Lab 08/03/2017 2025 07/25/2017 2359 07/24/17 0443 07/24/17 1116  TROPONINI <0.03 <0.03 <0.03 <0.03   BNP (last 3 results) No results for input(s): PROBNP in the last 8760 hours. HbA1C: No results for input(s): HGBA1C in the last 72 hours. CBG: No results for input(s): GLUCAP in the last 168 hours. Lipid Profile: Recent Labs    07/24/17 0443  CHOL 196  HDL 64  LDLCALC 120*  TRIG 59  CHOLHDL 3.1   Thyroid Function Tests: No results for input(s): TSH, T4TOTAL, FREET4, T3FREE, THYROIDAB in the last 72 hours. Anemia Panel: No results for input(s): VITAMINB12, FOLATE, FERRITIN, TIBC, IRON, RETICCTPCT in the last 72 hours. Urine analysis:    Component Value Date/Time   COLORURINE YELLOW 08/16/2016 [place] 08/16/2016 1441   LABSPEC  1.021 08/16/2016 1441   PHURINE 5.0 08/16/2016 1441   GLUCOSEU NEGATIVE 08/16/2016 1441   HGBUR MODERATE (A) 08/16/2016 1441   BILIRUBINUR NEGATIVE 08/16/2016 1441   KETONESUR NEGATIVE 08/16/2016 1441   PROTEINUR 100 (A) 08/16/2016 1441   UROBILINOGEN 0.2 05/29/2008 1030   NITRITE NEGATIVE 08/16/2016 1441   LEUKOCYTESUR TRACE (A) 08/16/2016 1441   Sepsis Labs: @LABRCNTIP (procalcitonin:4,lacticidven:4)  )No results found for this or any previous visit (from the past 240 hour(s)).    Studies: Ct Abdomen Pelvis Wo Contrast  Result Date: 07/10/2017 CLINICAL DATA:  Mid chest and back pain. Patient was recently treated for pneumonia and has L4 and L5 fractures coughing. EXAM: CT CHEST, ABDOMEN AND PELVIS WITHOUT CONTRAST TECHNIQUE: Multidetector CT imaging of the chest, abdomen and pelvis was performed following the standard protocol without IV contrast. COMPARISON:  CXR from the same day, 06/29/2017 CXR and 01/31/2015 CT AP FINDINGS: CT CHEST FINDINGS Cardiovascular: Conventional branch pattern of the great vessels with atherosclerosis of the  proximal great vessels. Mild atherosclerosis of the nonaneurysmal thoracic aorta measuring up to 3.3 cm along the ascending portion. Nondilated main pulmonary artery. Heart size is normal without pericardial effusion. Minimal coronary arteriosclerosis along the distal LAD. Mediastinum/Nodes: Mediastinal adenopathy is identified with largest lymph node right lower paratracheal measuring 2.2 cm in short axis. A 1.1 cm AP window lymph node is also noted. The trachea and mainstem bronchi are patent. Occlusion of the right lower lobe bronchus and branch vessels is noted. Lungs/Pleura: There is a confluent area of partially atelectatic/partially collapsed right lower lobe with displacement of the major fissure and inferior displacement of the right hilum from volume loss. This is likely related to obstruction of right lower lobe pulmonary bronchi possibly from mucus  inspissation given somewhat tubular hypodensity new noted within the visualized bronchi. Alternatively an endobronchial lesion is not excluded or unlikely an extrinsic infrahilar mass causing extrinsic impression given the subtle tubular soft tissue opacities noted within the expected bronchi. Musculoskeletal: Superior endplate Schmorl's node of T10. No aggressive osseous lesions. CT ABDOMEN PELVIS FINDINGS Hepatobiliary: Inhomogeneous masslike hypodensities are noted of the liver concerning for liver lesions possibly metastatic given findings in the chest. No biliary dilatation is identified. The gallbladder is unremarkable. Pancreas: Normal Spleen: Normal size spleen without focal mass. Adrenals/Urinary Tract: Status post left nephrectomy. Right adrenal gland and kidney are nonacute with compensatory hypertrophy of the right kidney. Punctate renal pelvic calcifications seen on the right possibly vascular or a nonobstructing calculus. Left adrenal gland is not well visualized and may be surgically absent as well. Nodularity in the expected location of the adrenal gland is felt to represent partial volume averaging of the tail of the pancreas. Stomach/Bowel: The stomach is decompressed in appearance. Normal small bowel rotation without obstruction or inflammation. The distal and terminal ileum are normal. Normal appearing appendix is noted. Increased stool burden within the colon compatible constipation. No large bowel inflammation. Partial colectomy on the left with sutures noted in the region of the rectosigmoid. Vascular/Lymphatic: Moderate to marked atherosclerosis of the abdominal aorta and branch vessels. Small retroperitoneal lymph nodes are identified without pathologic enlargement. Reproductive: Hysterectomy.  No adnexal mass. Other: No free air nor free fluid. Musculoskeletal: Degenerative disc disease L1 through L4. No aggressive osseous lesions identified. IMPRESSION: 1. Partial right lower lobe  collapse/atelectasis with displacement of the right major fissure and caudal displacement of the right hilum. Differential considerations for this finding may be secondary to mucous impaction. A right infrahilar mass or endobronchial lesion causing the atelectasis are not entirely excluded. Mediastinal lymphadenopathy is also noted concerning for possible metastatic disease though reactive lymph nodes might also account for this. 2. Subtle hypodensity masslike abnormalities of the unenhanced liver worrisome for possible metastatic disease in light of the pulmonary findings. CT or MRI with IV contrast would be preferable for further assessment. 3. Left nephrectomy. Compensatory hypertrophy of the right kidney with nonobstructing interpolar calculus possibly vascular. 4. Partial left colectomy. 5. Moderate atherosclerosis of the abdominal aorta and branch vessels. Electronically Signed   By: Ashley Royalty M.D.   On: 07/20/2017 21:33   Dg Chest 2 View  Result Date: 07/22/2017 CLINICAL DATA:  Chest pain EXAM: CHEST - 2 VIEW COMPARISON:  June 29, 2017 FINDINGS: There is extensive right lower lobe airspace consolidation consistent with pneumonia. Lungs elsewhere clear. Heart size and pulmonary vascularity are normal. No adenopathy. There is an old healed fracture of the lateral right sixth rib. There is aortic atherosclerosis. IMPRESSION: Extensive consolidation right  lower lobe consistent with pneumonia. Lungs elsewhere clear. Heart size normal. There is aortic atherosclerosis. Aortic Atherosclerosis (ICD10-I70.0). Electronically Signed   By: Lowella Grip III M.D.   On: 07/25/2017 20:57   Ct Chest Wo Contrast  Result Date: 07/27/2017 CLINICAL DATA:  Mid chest and back pain. Patient was recently treated for pneumonia and has L4 and L5 fractures coughing. EXAM: CT CHEST, ABDOMEN AND PELVIS WITHOUT CONTRAST TECHNIQUE: Multidetector CT imaging of the chest, abdomen and pelvis was performed following the standard  protocol without IV contrast. COMPARISON:  CXR from the same day, 06/29/2017 CXR and 01/31/2015 CT AP FINDINGS: CT CHEST FINDINGS Cardiovascular: Conventional branch pattern of the great vessels with atherosclerosis of the proximal great vessels. Mild atherosclerosis of the nonaneurysmal thoracic aorta measuring up to 3.3 cm along the ascending portion. Nondilated main pulmonary artery. Heart size is normal without pericardial effusion. Minimal coronary arteriosclerosis along the distal LAD. Mediastinum/Nodes: Mediastinal adenopathy is identified with largest lymph node right lower paratracheal measuring 2.2 cm in short axis. A 1.1 cm AP window lymph node is also noted. The trachea and mainstem bronchi are patent. Occlusion of the right lower lobe bronchus and branch vessels is noted. Lungs/Pleura: There is a confluent area of partially atelectatic/partially collapsed right lower lobe with displacement of the major fissure and inferior displacement of the right hilum from volume loss. This is likely related to obstruction of right lower lobe pulmonary bronchi possibly from mucus inspissation given somewhat tubular hypodensity new noted within the visualized bronchi. Alternatively an endobronchial lesion is not excluded or unlikely an extrinsic infrahilar mass causing extrinsic impression given the subtle tubular soft tissue opacities noted within the expected bronchi. Musculoskeletal: Superior endplate Schmorl's node of T10. No aggressive osseous lesions. CT ABDOMEN PELVIS FINDINGS Hepatobiliary: Inhomogeneous masslike hypodensities are noted of the liver concerning for liver lesions possibly metastatic given findings in the chest. No biliary dilatation is identified. The gallbladder is unremarkable. Pancreas: Normal Spleen: Normal size spleen without focal mass. Adrenals/Urinary Tract: Status post left nephrectomy. Right adrenal gland and kidney are nonacute with compensatory hypertrophy of the right kidney.  Punctate renal pelvic calcifications seen on the right possibly vascular or a nonobstructing calculus. Left adrenal gland is not well visualized and may be surgically absent as well. Nodularity in the expected location of the adrenal gland is felt to represent partial volume averaging of the tail of the pancreas. Stomach/Bowel: The stomach is decompressed in appearance. Normal small bowel rotation without obstruction or inflammation. The distal and terminal ileum are normal. Normal appearing appendix is noted. Increased stool burden within the colon compatible constipation. No large bowel inflammation. Partial colectomy on the left with sutures noted in the region of the rectosigmoid. Vascular/Lymphatic: Moderate to marked atherosclerosis of the abdominal aorta and branch vessels. Small retroperitoneal lymph nodes are identified without pathologic enlargement. Reproductive: Hysterectomy.  No adnexal mass. Other: No free air nor free fluid. Musculoskeletal: Degenerative disc disease L1 through L4. No aggressive osseous lesions identified. IMPRESSION: 1. Partial right lower lobe collapse/atelectasis with displacement of the right major fissure and caudal displacement of the right hilum. Differential considerations for this finding may be secondary to mucous impaction. A right infrahilar mass or endobronchial lesion causing the atelectasis are not entirely excluded. Mediastinal lymphadenopathy is also noted concerning for possible metastatic disease though reactive lymph nodes might also account for this. 2. Subtle hypodensity masslike abnormalities of the unenhanced liver worrisome for possible metastatic disease in light of the pulmonary findings. CT or MRI with  IV contrast would be preferable for further assessment. 3. Left nephrectomy. Compensatory hypertrophy of the right kidney with nonobstructing interpolar calculus possibly vascular. 4. Partial left colectomy. 5. Moderate atherosclerosis of the abdominal aorta  and branch vessels. Electronically Signed   By: Ashley Royalty M.D.   On: 07/26/2017 21:33   Mr Abdomen W Wo Contrast  Result Date: 07/24/2017 CLINICAL DATA:  LEFT lower lobe collapse difficulty breathing. Indeterminate hepatic lesions identified on noncontrast CT. History of renal cell carcinoma. LEFT nephrectomy EXAM: MRI ABDOMEN WITHOUT AND WITH CONTRAST TECHNIQUE: Multiplanar multisequence MR imaging of the abdomen was performed both before and after the administration of intravenous contrast. CONTRAST:  10mL MULTIHANCE GADOBENATE DIMEGLUMINE 529 MG/ML IV SOLN COMPARISON:  CT 07/18/2017 FINDINGS: Lower chest: A complete collapse of the RIGHT lower lobe with obstruction of the RIGHT lower lobe bronchus. On coronal imaging there is masslike lesion surrounding the bronchus intermedius measuring 6.4 by 4.1 cm (image 16/6 per Hepatobiliary: Too numerous to count round enhancing lesions extensively involving the LEFT RIGHT hepatic lobe. Lesions range in size from 10 mm to 33 mm. Approximately 100 lesions. Largest lesion occupies the LEFT hepatic lobe measures 6.4 cm large example lesion in the RIGHT hepatic lobe measures 3.6 cm in the dome (image 27/9 0 point. There is no biliary duct dilatation. The gallbladder and common bile duct are normal. Pancreas: Pancreas is normal. No ductal dilatation. No pancreatic inflammation. Spleen: Normal spleen Adrenals/urinary tract: Adrenal glands are normal. Post LEFT nephrectomy. Compensatory hypertrophy of the RIGHT kidney. No RIGHT renal lesion. Stomach/Bowel: Stomach and limited view of the bowel is unremarkable. Vascular/Lymphatic: Normal abdominal aorta. No significant adenopathy Reproductive: Other: No free fluid. Musculoskeletal: No aggressive osseous lesion. IMPRESSION: 1. Large RIGHT hilar mass surrounds the bronchus intermedius and extends into the mediastinum consistent with primary malignancy versus metastatic lesion. This lesion obstructs the RIGHT lower bronchus  with complete atelectasis of the RIGHT lower lobe. 2. 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 Lesions reached near confluence and and number approximately 100. 3. LEFT nephrectomy.  Normal hypertrophy RIGHT kidney. 4. No bony metastasis identified Electronically Signed   By: Suzy Bouchard M.D.   On: 07/24/2017 15:28    Scheduled Meds: . albuterol  2.5 mg Nebulization TID  . aspirin EC  325 mg Oral Daily  . enoxaparin (LOVENOX) injection  40 mg Subcutaneous Q24H  . feeding supplement (PRO-STAT SUGAR FREE 64)  30 mL Oral BID  . guaiFENesin  600 mg Oral BID  . lipase/protease/amylase  12,000 Units Oral TID AC  . mouth rinse  15 mL Mouth Rinse BID  . methylPREDNISolone (SOLU-MEDROL) injection  80 mg Intravenous Q8H  . mometasone-formoterol  2 puff Inhalation BID  . nicotine  21 mg Transdermal Daily  . tiotropium  18 mcg Inhalation Daily    Continuous Infusions: . ceFEPime (MAXIPIME) IV Stopped (07/24/17 1338)  . vancomycin       LOS: 1 day     Kayleen Memos, MD [facility] Pager [phone]  If 7PM-7AM, please contact night-coverage [email or web address] Password [facility] 07/24/2017, 4:14 PM

## 2017-07-24 NOTE — Consult Note (Addendum)
Name: Wanda James MRN: [id number] DOB: [birth year]    ADMISSION DATE:  08/01/2017 CONSULTATION DATE:  7/21  REFERRING MD :  Nevada Crane  CHIEF COMPLAINT:  Abnormal CT chest   BRIEF PATIENT DESCRIPTION/HPI 62 year old patient with h/o tobacco abuse and prob COPD presents with recurrent right lower lobe pneumonia, CT chest suggesting possible lung mass with postobstructive process pulmonary asked to evaluate on 7/21  SIGNIFICANT EVENTS    STUDIES:  CT chest 7/21: Partial right lower lobe collapse/atelectasis with displacement of the right major fissure and caudal displacement of the right hilum.  There is also mediastinal lymphadenopathy and a subtle hypodensity masslike area in the liver   HISTORY OF PRESENT ILLNESS:   This is a 62 year old female patient with a significant history of renal cell carcinoma status post prior left nephrectomy, tobacco abuse, prob COPD with oxygen dependence since recent admit for PNA at end of June. Presented to the emergency room with chief complaint of: Approximately 1 week history of worsening cough initially productive of sputum with black spots, shortness of breath, decreased p.o. appetite with shortness of breath particularly worse over the prior 2 days.    Recently discharged on the hospital in 6/23 2019 where she was admitted for acute acute exacerbation COPD and also pneumonia she was discharged home with prednisone taper, oxygen, and Levaquin. Review of imaging during that hospitalization did show right lower lobe airspace disease, this appeared to improve some on serial films prior to discharge.  She was seen once again by her primary care provider on 7/17 for worsening of symptoms at that time she was started on Biaxin, her symptoms have not improved and therefore she presented to the emergency room.   On presentation this time a CT of chest was obtained this demonstrated: Partial right lower lobe collapse/atelectasis with displacement of the right  fissure, mediastinal adenopathy, subtle hypodense masslike abnormality of the liver.  She was admitted with a working diagnosis of recurrent pneumonia with possible lung mass.  Pulmonary has been asked to evaluate in regards to CT findings.   PAST MEDICAL HISTORY :   has a past medical history of Acid reflux, Arthritis, Cancer ([place]), Cataract, Chiari malformation type I ([place]), Complication of anesthesia, COPD (chronic obstructive pulmonary disease) ([place]), Diverticulitis, Headache, Pneumonia, Stroke ([place]), and Subclavian artery stenosis, left ([place]).  has a past surgical history that includes carotidendarterectomy; Nephrectomy; Abdominal hysterectomy; Inner ear surgery; Eye surgery; Tonsillectomy; Colon surgery; Partial colectomy; Tubal ligation; Breast surgery; Rhinoplasty; and Lumbar laminectomy/decompression microdiscectomy (N/A, 08/19/2016). Prior to Admission medications   Medication Sig Start Date End Date Taking? Authorizing Provider  aspirin EC 325 MG tablet Take 325 mg by mouth daily.   Yes [provider]  clarithromycin (BIAXIN) 500 MG tablet TK 1 T PO BID 07/19/17  Yes [provider]  CREON 24000-76000 units CPEP TK 1 C PO TID WITH MEALS AND WITH EVENING SNACKS 06/15/17  Yes [provider]  Melatonin 3 MG TABS Take 3 mg by mouth at bedtime.   Yes [provider]  nicotine (NICODERM CQ - DOSED IN MG/24 HOURS) 21 mg/24hr patch APP 1 PA EXT TO THE SKIN D 07/20/17  Yes [provider]  ondansetron (ZOFRAN) 8 MG tablet TK 1 T PO TID PRN NAUSEA AND VOMITING 07/20/17  Yes [provider]  promethazine (PHENERGAN) 25 MG tablet TK 1/2 T PO BID PRF NAUSEA 05/13/17  Yes [provider]  SYMBICORT 80-4.5 MCG/ACT inhaler INHALE 2 PUFFS PO BID PRN FOR  SOB AND WHEEZING 07/20/17  Yes [provider]  traMADol (ULTRAM) 50 MG tablet Take 50 mg by mouth every 6 (six) hours as needed for severe pain.   Yes [provider]    acetaminophen (TYLENOL) 325 MG tablet Take 2 tablets (650 mg total) by mouth every 6 (six) hours as needed for mild pain (or Fever >/= 101). 07/01/17   Debbe Odea, MD  albuterol (PROVENTIL) (2.5 MG/3ML) 0.083% nebulizer solution Take 3 mLs (2.5 mg total) by nebulization every 2 (two) hours as needed for wheezing or shortness of breath. 07/01/17   Debbe Odea, MD  dextromethorphan-guaiFENesin (MUCINEX DM) 30-600 MG 12hr tablet Take 1 tablet by mouth 2 (two) times daily. Patient not taking: Reported on 07/18/2017 07/01/17   Debbe Odea, MD  diphenhydrAMINE (BENADRYL) 25 MG tablet Take 25 mg by mouth 2 (two) times daily as needed (allergic reactions).    [provider]  docusate sodium (COLACE) 100 MG capsule Take 100 mg by mouth as needed for mild constipation.    [provider]  glucose monitoring kit (FREESTYLE) monitoring kit 1 each by Does not apply route 4 (four) times daily - after meals and at bedtime. 1 month Diabetic Testing Supplies for QAC-QHS accuchecks. 07/01/17   Debbe Odea, MD  ipratropium-albuterol (DUONEB) 0.5-2.5 (3) MG/3ML SOLN Take 3 mLs by nebulization every 6 (six) hours as needed. Patient taking differently: Take 3 mLs by nebulization every 6 (six) hours as needed (sob and wheezing).  07/01/17   Debbe Odea, MD  levofloxacin (LEVAQUIN) 750 MG tablet Take 1 tablet (750 mg total) by mouth daily. Patient not taking: Reported on 07/07/2017 07/01/17   Debbe Odea, MD  nicotine (NICODERM CQ) 14 mg/24hr patch Place 1 patch (14 mg total) onto the skin daily. Start when the 21 mg patches are finished Patient not taking: Reported on 07/07/2017 07/01/17   Debbe Odea, MD  nicotine (NICODERM CQ) 7 mg/24hr patch Place 1 patch (7 mg total) onto the skin daily. Patient not taking: Reported on 07/28/2017 07/01/17   Debbe Odea, MD  PROAIR HFA 108 [phone] Base) MCG/ACT inhaler INL 1 INHALATION PO QID PRN FOR SOB AND WHEEZING 07/19/17   [provider]   Allergies   Allergen Reactions  . Iohexol Anaphylaxis     Desc: ANAPHALAXIS-ARS ON 08-22-04 Went into a coma  . Sodium Hypochlorite Other (See Comments)    Solution used to place on packing burned very bad, patient states she can't use it any more due to this/thjcma  . Penicillins Hives and Swelling    Has patient had a PCN reaction causing immediate rash, facial/tongue/throat swelling, SOB or lightheadedness with hypotension: No Has patient had a PCN reaction causing severe rash involving mucus membranes or skin necrosis: Yes Has patient had a PCN reaction that required hospitalization: No Has patient had a PCN reaction occurring within the last 10 years: No If all of the above answers are "NO", then may proceed with Cephalosporin use.   Marland Kitchen Hydrocodone Nausea And Vomiting  . Tape Rash    Itchy rash    FAMILY HISTORY:  family history includes Lung cancer in her father. SOCIAL HISTORY:  reports that she has been smoking cigarettes.  She has been smoking about 1.00 pack per day. She has never used smokeless tobacco. She reports that she does not drink alcohol or use drugs.  REVIEW OF SYSTEMS:red are positive    Constitutional: Negative for fever, chills, weight loss, malaise/fatigue and diaphoresis.  HENT: Negative for  hearing loss, ear pain, nosebleeds, congestion, sore throat, neck pain, tinnitus and ear discharge.   Eyes: Negative for blurred vision, double vision, photophobia, pain, discharge and redness.  Respiratory: Negative for cough, hemoptysis, sputum production, shortness of breath, wheezing and stridor.  pain in right chest  Cardiovascular: Negative for chest pain, palpitations, orthopnea, claudication, leg swelling and PND.  Gastrointestinal: Negative for heartburn, nausea, vomiting, abdominal pain, diarrhea, constipation, blood in stool and melena.  Genitourinary: Negative for dysuria, urgency, frequency, hematuria and flank pain.  Musculoskeletal: Negative for myalgias, back pain,  joint pain and falls.  Skin: Negative for itching and rash.  Neurological: Negative for dizziness, tingling, tremors, sensory change, speech change, focal weakness, seizures, loss of consciousness, weakness and headaches.  Endo/Heme/Allergies: Negative for environmental allergies and polydipsia. Does not bruise/bleed easily.  SUBJECTIVE:  Feeling a little better VITAL SIGNS: Temp:  [97.8 F (36.6 C)-98.3 F (36.8 C)] 98.3 F (36.8 C) (07/21 0525) Pulse Rate:  [90-114] 90 (07/21 0525) Resp:  [17-26] 18 (07/21 0525) BP: (123-146)/(74-100) 123/74 (07/21 0525) SpO2:  [94 %-100 %] 94 % (07/21 0824) Weight:  [111 lb 12.4 oz (50.7 kg)-113 lb (51.3 kg)] 111 lb 12.4 oz (50.7 kg) (07/21 0055)  PHYSICAL EXAMINATION:  General: 62 year old female currently resting comfortably in bed she is in no acute distress HEENT: Normocephalic atraumatic no jugular venous distention Cardiovascular: Regular rate and rhythm no murmur rub or gallop Lungs: Scattered rhonchi worse on right no accessory use Abdomen: Soft nontender no organomegaly Musculoskeletal: Equal strength and bulk Skin: Warm and dry  Recent Labs  Lab 07/28/2017 2025 07/09/2017 2026 07/24/17 0443  NA 138 136 138  K 3.7 3.7 4.4  CL 99 99 104  CO2 26  --  25  BUN _0 CREATININE 0.81 0.80 0.74  GLUCOSE 118* 116* 143*   Recent Labs  Lab 07/05/2017 2025 07/13/2017 2026 07/24/17 0443  HGB 14.5 15.6* 13.2  HCT 43.7 46.0 40.8  WBC 6.8  --  5.9  PLT 304  --  254   Ct Abdomen Pelvis Wo Contrast  Result Date: 07/29/2017 CLINICAL DATA:  Mid chest and back pain. Patient was recently treated for pneumonia and has L4 and L5 fractures coughing. EXAM: CT CHEST, ABDOMEN AND PELVIS WITHOUT CONTRAST TECHNIQUE: Multidetector CT imaging of the chest, abdomen and pelvis was performed following the standard protocol without IV contrast. COMPARISON:  CXR from the same day, 06/29/2017 CXR and 01/31/2015 CT AP FINDINGS: CT CHEST FINDINGS Cardiovascular:  Conventional branch pattern of the great vessels with atherosclerosis of the proximal great vessels. Mild atherosclerosis of the nonaneurysmal thoracic aorta measuring up to 3.3 cm along the ascending portion. Nondilated main pulmonary artery. Heart size is normal without pericardial effusion. Minimal coronary arteriosclerosis along the distal LAD. Mediastinum/Nodes: Mediastinal adenopathy is identified with largest lymph node right lower paratracheal measuring 2.2 cm in short axis. A 1.1 cm AP window lymph node is also noted. The trachea and mainstem bronchi are patent. Occlusion of the right lower lobe bronchus and branch vessels is noted. Lungs/Pleura: There is a confluent area of partially atelectatic/partially collapsed right lower lobe with displacement of the major fissure and inferior displacement of the right hilum from volume loss. This is likely related to obstruction of right lower lobe pulmonary bronchi possibly from mucus inspissation given somewhat tubular hypodensity new noted within the visualized bronchi. Alternatively an endobronchial lesion is not excluded or unlikely an extrinsic infrahilar mass causing extrinsic impression given the subtle tubular soft tissue  opacities noted within the expected bronchi. Musculoskeletal: Superior endplate Schmorl's node of T10. No aggressive osseous lesions. CT ABDOMEN PELVIS FINDINGS Hepatobiliary: Inhomogeneous masslike hypodensities are noted of the liver concerning for liver lesions possibly metastatic given findings in the chest. No biliary dilatation is identified. The gallbladder is unremarkable. Pancreas: Normal Spleen: Normal size spleen without focal mass. Adrenals/Urinary Tract: Status post left nephrectomy. Right adrenal gland and kidney are nonacute with compensatory hypertrophy of the right kidney. Punctate renal pelvic calcifications seen on the right possibly vascular or a nonobstructing calculus. Left adrenal gland is not well visualized and may  be surgically absent as well. Nodularity in the expected location of the adrenal gland is felt to represent partial volume averaging of the tail of the pancreas. Stomach/Bowel: The stomach is decompressed in appearance. Normal small bowel rotation without obstruction or inflammation. The distal and terminal ileum are normal. Normal appearing appendix is noted. Increased stool burden within the colon compatible constipation. No large bowel inflammation. Partial colectomy on the left with sutures noted in the region of the rectosigmoid. Vascular/Lymphatic: Moderate to marked atherosclerosis of the abdominal aorta and branch vessels. Small retroperitoneal lymph nodes are identified without pathologic enlargement. Reproductive: Hysterectomy.  No adnexal mass. Other: No free air nor free fluid. Musculoskeletal: Degenerative disc disease L1 through L4. No aggressive osseous lesions identified. IMPRESSION: 1. Partial right lower lobe collapse/atelectasis with displacement of the right major fissure and caudal displacement of the right hilum. Differential considerations for this finding may be secondary to mucous impaction. A right infrahilar mass or endobronchial lesion causing the atelectasis are not entirely excluded. Mediastinal lymphadenopathy is also noted concerning for possible metastatic disease though reactive lymph nodes might also account for this. 2. Subtle hypodensity masslike abnormalities of the unenhanced liver worrisome for possible metastatic disease in light of the pulmonary findings. CT or MRI with IV contrast would be preferable for further assessment. 3. Left nephrectomy. Compensatory hypertrophy of the right kidney with nonobstructing interpolar calculus possibly vascular. 4. Partial left colectomy. 5. Moderate atherosclerosis of the abdominal aorta and branch vessels. Electronically Signed   By: Ashley Royalty M.D.   On: 07/21/2017 21:33   Dg Chest 2 View  Result Date: 07/14/2017 CLINICAL DATA:   Chest pain EXAM: CHEST - 2 VIEW COMPARISON:  June 29, 2017 FINDINGS: There is extensive right lower lobe airspace consolidation consistent with pneumonia. Lungs elsewhere clear. Heart size and pulmonary vascularity are normal. No adenopathy. There is an old healed fracture of the lateral right sixth rib. There is aortic atherosclerosis. IMPRESSION: Extensive consolidation right lower lobe consistent with pneumonia. Lungs elsewhere clear. Heart size normal. There is aortic atherosclerosis. Aortic Atherosclerosis (ICD10-I70.0). Electronically Signed   By: Lowella Grip III M.D.   On: 07/08/2017 20:57   Ct Chest Wo Contrast  Result Date: 07/18/2017 CLINICAL DATA:  Mid chest and back pain. Patient was recently treated for pneumonia and has L4 and L5 fractures coughing. EXAM: CT CHEST, ABDOMEN AND PELVIS WITHOUT CONTRAST TECHNIQUE: Multidetector CT imaging of the chest, abdomen and pelvis was performed following the standard protocol without IV contrast. COMPARISON:  CXR from the same day, 06/29/2017 CXR and 01/31/2015 CT AP FINDINGS: CT CHEST FINDINGS Cardiovascular: Conventional branch pattern of the great vessels with atherosclerosis of the proximal great vessels. Mild atherosclerosis of the nonaneurysmal thoracic aorta measuring up to 3.3 cm along the ascending portion. Nondilated main pulmonary artery. Heart size is normal without pericardial effusion. Minimal coronary arteriosclerosis along the distal LAD. Mediastinum/Nodes: Mediastinal adenopathy is  identified with largest lymph node right lower paratracheal measuring 2.2 cm in short axis. A 1.1 cm AP window lymph node is also noted. The trachea and mainstem bronchi are patent. Occlusion of the right lower lobe bronchus and branch vessels is noted. Lungs/Pleura: There is a confluent area of partially atelectatic/partially collapsed right lower lobe with displacement of the major fissure and inferior displacement of the right hilum from volume loss. This is  likely related to obstruction of right lower lobe pulmonary bronchi possibly from mucus inspissation given somewhat tubular hypodensity new noted within the visualized bronchi. Alternatively an endobronchial lesion is not excluded or unlikely an extrinsic infrahilar mass causing extrinsic impression given the subtle tubular soft tissue opacities noted within the expected bronchi. Musculoskeletal: Superior endplate Schmorl's node of T10. No aggressive osseous lesions. CT ABDOMEN PELVIS FINDINGS Hepatobiliary: Inhomogeneous masslike hypodensities are noted of the liver concerning for liver lesions possibly metastatic given findings in the chest. No biliary dilatation is identified. The gallbladder is unremarkable. Pancreas: Normal Spleen: Normal size spleen without focal mass. Adrenals/Urinary Tract: Status post left nephrectomy. Right adrenal gland and kidney are nonacute with compensatory hypertrophy of the right kidney. Punctate renal pelvic calcifications seen on the right possibly vascular or a nonobstructing calculus. Left adrenal gland is not well visualized and may be surgically absent as well. Nodularity in the expected location of the adrenal gland is felt to represent partial volume averaging of the tail of the pancreas. Stomach/Bowel: The stomach is decompressed in appearance. Normal small bowel rotation without obstruction or inflammation. The distal and terminal ileum are normal. Normal appearing appendix is noted. Increased stool burden within the colon compatible constipation. No large bowel inflammation. Partial colectomy on the left with sutures noted in the region of the rectosigmoid. Vascular/Lymphatic: Moderate to marked atherosclerosis of the abdominal aorta and branch vessels. Small retroperitoneal lymph nodes are identified without pathologic enlargement. Reproductive: Hysterectomy.  No adnexal mass. Other: No free air nor free fluid. Musculoskeletal: Degenerative disc disease L1 through L4. No  aggressive osseous lesions identified. IMPRESSION: 1. Partial right lower lobe collapse/atelectasis with displacement of the right major fissure and caudal displacement of the right hilum. Differential considerations for this finding may be secondary to mucous impaction. A right infrahilar mass or endobronchial lesion causing the atelectasis are not entirely excluded. Mediastinal lymphadenopathy is also noted concerning for possible metastatic disease though reactive lymph nodes might also account for this. 2. Subtle hypodensity masslike abnormalities of the unenhanced liver worrisome for possible metastatic disease in light of the pulmonary findings. CT or MRI with IV contrast would be preferable for further assessment. 3. Left nephrectomy. Compensatory hypertrophy of the right kidney with nonobstructing interpolar calculus possibly vascular. 4. Partial left colectomy. 5. Moderate atherosclerosis of the abdominal aorta and branch vessels. Electronically Signed   By: Ashley Royalty M.D.   On: 07/16/2017 21:33    ASSESSMENT / PLAN: Recurrent pneumonia, recent healthcare exposure Postobstructive atelectasis versus pneumonia Probable lung mass Mediastinal lymphadenopathy Possible liver mass Chronic respiratory failure COPD Tobacco abuse Remote renal cell carcinoma requiring left nephrectomy   Discussion 62 year old patient with history of chronic respiratory failure in the setting of COPD and tobacco abuse.  Presents once again with a working diagnosis of recurrent pneumonia on 6/20.  Review of CT imaging raises concern for obstruction or other right lower lobe bronchus and resultant partial right lower lobe collapse.  Although she certainly could have recurrent aspiration and debris resulting in this, also would consider cancer with a postobstructive  process presently.  She has had a esophageal stricture in the past, but does not report any dysphasia or coughing with p.o. intake as of lately.  She does  have a possible lesion on her liver as well she is to have an MRI today Plan Continue supplemental oxygen Follow-up urine Legionella antigen Procalcitonin algorithm Day #2 vancomycin and cefepime Follow-up MRI We will see her again on 7/22, will likely need bronchoscopy with airway evaluation as well as biopsy; although depending on MRI may find additional target site of concern which would help with further [place] ACNP-BC [place] Pager # [phone] OR # [phone] if no answer'   07/24/2017, 1:46 PM

## 2017-07-24 NOTE — ED Notes (Signed)
ED TO INPATIENT HANDOFF REPORT  Name/Age/Gender Wanda James 62 y.o. female  Code Status    Code Status Orders  (From admission, onward)        Start     Ordered   07/25/2017 2322  Full code  Continuous     07/20/2017 2322    Code Status History    Date Active Date Inactive Code Status Order ID Comments User Context   06/26/2017 1753 07/01/2017 1630 Full Code [id number]  Dessa Phi, DO Inpatient      Home/SNF/Other Home  Chief Complaint Chest and Back Pain   Level of Care/Admitting Diagnosis ED Disposition    ED Disposition Condition [facility] Area: [facility] [[id number]]  Level of Care: Telemetry [5]  Admit to tele based on following criteria: Monitor for Ischemic changes  Diagnosis: Chest pain [[id number]]  Admitting Physician: Jani Gravel [[id number]]  Attending Physician: Jani Gravel [phone]  Estimated length of stay: past midnight tomorrow  Certification:: I certify this patient will need inpatient services for at least 2 midnights  PT Class (Do Not Modify): Inpatient [101]  PT Acc Code (Do Not Modify): Private [1]       Medical History Past Medical History:  Diagnosis Date  . Acid reflux   . Arthritis   . Cancer ([place])    kidney  . Cataract   . Chiari malformation type I ([place])   . Complication of anesthesia    Hard To Awaken  . COPD (chronic obstructive pulmonary disease) ([place])   . Diverticulitis   . Headache   . Pneumonia   . Stroke ([place])   . Subclavian artery stenosis, left ([facility])    s/p angioplasty/stent 04/25/08    Allergies Allergies  Allergen Reactions  . Iohexol Anaphylaxis     Desc: ANAPHALAXIS-ARS ON 08-22-04 Went into a coma  . Sodium Hypochlorite Other (See Comments)    Solution used to place on packing burned very bad, patient states she can't use it any more due to this/thjcma  . Penicillins Hives and Swelling    Has patient had a PCN reaction causing immediate rash, facial/tongue/throat swelling, SOB or  lightheadedness with hypotension: No Has patient had a PCN reaction causing severe rash involving mucus membranes or skin necrosis: Yes Has patient had a PCN reaction that required hospitalization: No Has patient had a PCN reaction occurring within the last 10 years: No If all of the above answers are "NO", then may proceed with Cephalosporin use.   Marland Kitchen Hydrocodone Nausea And Vomiting  . Tape Rash    Itchy rash    IV Location/Drains/Wounds Patient Lines/Drains/Airways Status   Active Line/Drains/Airways    Name:   Placement date:   Placement time:   Site:   Days:   Peripheral IV 07/06/2017 Left Antecubital   07/29/2017    2016    Antecubital   1   Incision (Closed) 08/19/16 Back Other (Comment)   08/19/16    1621     339          Labs/Imaging Results for orders placed or performed during the hospital encounter of 07/11/2017 (from the past 48 hour(s))  I-stat troponin, ED     Status: None   Collection Time: 07/27/2017  8:23 PM  Result Value Ref Range   Troponin i, poc 0.00 0.00 - 0.08 ng/mL   Comment 3            Comment: Due to the release kinetics of cTnI,  a negative result within the first hours of the onset of symptoms does not rule out myocardial infarction with certainty. If myocardial infarction is still suspected, repeat the test at appropriate intervals.   Troponin I     Status: None   Collection Time: 07/12/2017  8:25 PM  Result Value Ref Range   Troponin I <0.03 <0.03 ng/mL    Comment: Performed at [facility], [place] [address] [place], [place] [address]  CBC with Differential/Platelet     Status: None   Collection Time: 07/11/2017  8:25 PM  Result Value Ref Range   WBC 6.8 4.0 - 10.5 K/uL   RBC 4.93 3.87 - 5.11 MIL/uL   Hemoglobin 14.5 12.0 - 15.0 g/dL   HCT 43.7 36.0 - 46.0 %   MCV 88.6 78.0 - 100.0 fL   MCH 29.4 26.0 - 34.0 pg   MCHC 33.2 30.0 - 36.0 g/dL   RDW 14.7 11.5 - 15.5 %   Platelets 304 150 - 400 K/uL   Neutrophils Relative % 73 %    Neutro Abs 5.0 1.7 - 7.7 K/uL   Lymphocytes Relative 12 %   Lymphs Abs 0.8 0.7 - 4.0 K/uL   Monocytes Relative 12 %   Monocytes Absolute 0.8 0.1 - 1.0 K/uL   Eosinophils Relative 2 %   Eosinophils Absolute 0.2 0.0 - 0.7 K/uL   Basophils Relative 1 %   Basophils Absolute 0.0 0.0 - 0.1 K/uL    Comment: Performed at [facility], [place] [address] [place], [place] [address]  Comprehensive metabolic panel     Status: Abnormal   Collection Time: 07/26/2017  8:25 PM  Result Value Ref Range   Sodium 138 135 - 145 mmol/L   Potassium 3.7 3.5 - 5.1 mmol/L   Chloride 99 98 - 111 mmol/L    Comment: Please note change in reference range.   CO2 26 22 - 32 mmol/L   Glucose, Bld 118 (H) 70 - 99 mg/dL    Comment: Please note change in reference range.   BUN 11 8 - 23 mg/dL    Comment: Please note change in reference range.   Creatinine, Ser 0.81 0.44 - 1.00 mg/dL   Calcium 9.1 8.9 - 10.3 mg/dL   Total Protein 7.0 6.5 - 8.1 g/dL   Albumin 2.9 (L) 3.5 - 5.0 g/dL   AST 193 (H) 15 - 41 U/L   ALT 129 (H) 0 - 44 U/L    Comment: Please note change in reference range.   Alkaline Phosphatase 518 (H) 38 - 126 U/L   Total Bilirubin 0.3 0.3 - 1.2 mg/dL   GFR calc non Af Amer >60 >60 mL/min   GFR calc Af Amer >60 >60 mL/min    Comment: (NOTE) The eGFR has been calculated using the CKD EPI equation. This calculation has not been validated in all clinical situations. eGFR's persistently <60 mL/min signify possible Chronic Kidney Disease.    Anion gap 13 5 - 15    Comment: Performed at [facility], [place] [address] [place], [place] [address]  Lipase, blood     Status: Abnormal   Collection Time: 07/30/2017  8:25 PM  Result Value Ref Range   Lipase 99 (H) 11 - 51 U/L    Comment: Performed at [facility], [place] [address], [place], [place] [address]  I-stat chem 8, ed     Status: Abnormal   Collection Time: 07/07/2017  8:26 PM  Result Value Ref Range    Sodium 136 135 - 145 mmol/L   Potassium 3.7 3.5 - 5.1 mmol/L   Chloride 99 98 - 111 mmol/L   BUN 9 8 - 23 mg/dL   Creatinine, Ser 0.80 0.44 - 1.00 mg/dL   Glucose, Bld 116 (H) 70 - 99 mg/dL   Calcium, Ion 1.12 (L) 1.15 - 1.40 mmol/L   TCO2 24 22 - 32 mmol/L   Hemoglobin 15.6 (H) 12.0 - 15.0 g/dL   HCT 46.0 36.0 - 46.0 %  I-Stat CG4 Lactic Acid, ED     Status: Abnormal   Collection Time: 07/30/2017  8:29 PM  Result Value Ref Range   Lactic Acid, Venous 2.27 (HH) 0.5 - 1.9 mmol/L   Comment NOTIFIED PHYSICIAN   I-Stat CG4 Lactic Acid, ED     Status: None   Collection Time: 08/01/2017 10:49 PM  Result Value Ref Range   Lactic Acid, Venous 1.49 0.5 - 1.9 mmol/L   Ct Abdomen Pelvis Wo Contrast  Result Date: 07/09/2017 CLINICAL DATA:  Mid chest and back pain. Patient was recently treated for pneumonia and has L4 and L5 fractures coughing. EXAM: CT CHEST, ABDOMEN AND PELVIS WITHOUT CONTRAST TECHNIQUE: Multidetector CT imaging of the chest, abdomen and pelvis was performed following the standard protocol without IV contrast. COMPARISON:  CXR from the same day, 06/29/2017 CXR and 01/31/2015 CT AP FINDINGS: CT CHEST FINDINGS Cardiovascular: Conventional branch pattern of the great vessels with atherosclerosis of the proximal great vessels. Mild atherosclerosis of the nonaneurysmal thoracic aorta measuring up to 3.3 cm along the ascending portion. Nondilated main pulmonary artery. Heart size is normal without pericardial effusion. Minimal coronary arteriosclerosis along the distal LAD. Mediastinum/Nodes: Mediastinal adenopathy is identified with largest lymph node right lower paratracheal measuring 2.2 cm in short axis. A 1.1 cm AP window lymph node is also noted. The trachea and mainstem bronchi are patent. Occlusion of the right lower lobe bronchus and branch vessels is noted. Lungs/Pleura: There is a confluent area of partially atelectatic/partially collapsed right lower lobe with displacement of the major  fissure and inferior displacement of the right hilum from volume loss. This is likely related to obstruction of right lower lobe pulmonary bronchi possibly from mucus inspissation given somewhat tubular hypodensity new noted within the visualized bronchi. Alternatively an endobronchial lesion is not excluded or unlikely an extrinsic infrahilar mass causing extrinsic impression given the subtle tubular soft tissue opacities noted within the expected bronchi. Musculoskeletal: Superior endplate Schmorl's node of T10. No aggressive osseous lesions. CT ABDOMEN PELVIS FINDINGS Hepatobiliary: Inhomogeneous masslike hypodensities are noted of the liver concerning for liver lesions possibly metastatic given findings in the chest. No biliary dilatation is identified. The gallbladder is unremarkable. Pancreas: Normal Spleen: Normal size spleen without focal mass. Adrenals/Urinary Tract: Status post left nephrectomy. Right adrenal gland and kidney are nonacute with compensatory hypertrophy of the right kidney. Punctate renal pelvic calcifications seen on the right possibly vascular or a nonobstructing calculus. Left adrenal gland is not well visualized and may be surgically absent as well. Nodularity in the expected location of the adrenal gland is felt to represent partial volume averaging of the tail of the pancreas. Stomach/Bowel: The stomach is decompressed in appearance. Normal small bowel rotation without obstruction or inflammation. The distal and terminal ileum are normal. Normal appearing appendix is noted. Increased stool burden within the colon compatible constipation. No large bowel inflammation. Partial colectomy on the left with sutures noted in the region of the rectosigmoid. Vascular/Lymphatic:  Moderate to marked atherosclerosis of the abdominal aorta and branch vessels. Small retroperitoneal lymph nodes are identified without pathologic enlargement. Reproductive: Hysterectomy.  No adnexal mass. Other: No free  air nor free fluid. Musculoskeletal: Degenerative disc disease L1 through L4. No aggressive osseous lesions identified. IMPRESSION: 1. Partial right lower lobe collapse/atelectasis with displacement of the right major fissure and caudal displacement of the right hilum. Differential considerations for this finding may be secondary to mucous impaction. A right infrahilar mass or endobronchial lesion causing the atelectasis are not entirely excluded. Mediastinal lymphadenopathy is also noted concerning for possible metastatic disease though reactive lymph nodes might also account for this. 2. Subtle hypodensity masslike abnormalities of the unenhanced liver worrisome for possible metastatic disease in light of the pulmonary findings. CT or MRI with IV contrast would be preferable for further assessment. 3. Left nephrectomy. Compensatory hypertrophy of the right kidney with nonobstructing interpolar calculus possibly vascular. 4. Partial left colectomy. 5. Moderate atherosclerosis of the abdominal aorta and branch vessels. Electronically Signed   By: Ashley Royalty M.D.   On: 07/22/2017 21:33   Dg Chest 2 View  Result Date: 07/28/2017 CLINICAL DATA:  Chest pain EXAM: CHEST - 2 VIEW COMPARISON:  June 29, 2017 FINDINGS: There is extensive right lower lobe airspace consolidation consistent with pneumonia. Lungs elsewhere clear. Heart size and pulmonary vascularity are normal. No adenopathy. There is an old healed fracture of the lateral right sixth rib. There is aortic atherosclerosis. IMPRESSION: Extensive consolidation right lower lobe consistent with pneumonia. Lungs elsewhere clear. Heart size normal. There is aortic atherosclerosis. Aortic Atherosclerosis (ICD10-I70.0). Electronically Signed   By: Lowella Grip III M.D.   On: 07/15/2017 20:57   Ct Chest Wo Contrast  Result Date: 07/28/2017 CLINICAL DATA:  Mid chest and back pain. Patient was recently treated for pneumonia and has L4 and L5 fractures coughing.  EXAM: CT CHEST, ABDOMEN AND PELVIS WITHOUT CONTRAST TECHNIQUE: Multidetector CT imaging of the chest, abdomen and pelvis was performed following the standard protocol without IV contrast. COMPARISON:  CXR from the same day, 06/29/2017 CXR and 01/31/2015 CT AP FINDINGS: CT CHEST FINDINGS Cardiovascular: Conventional branch pattern of the great vessels with atherosclerosis of the proximal great vessels. Mild atherosclerosis of the nonaneurysmal thoracic aorta measuring up to 3.3 cm along the ascending portion. Nondilated main pulmonary artery. Heart size is normal without pericardial effusion. Minimal coronary arteriosclerosis along the distal LAD. Mediastinum/Nodes: Mediastinal adenopathy is identified with largest lymph node right lower paratracheal measuring 2.2 cm in short axis. A 1.1 cm AP window lymph node is also noted. The trachea and mainstem bronchi are patent. Occlusion of the right lower lobe bronchus and branch vessels is noted. Lungs/Pleura: There is a confluent area of partially atelectatic/partially collapsed right lower lobe with displacement of the major fissure and inferior displacement of the right hilum from volume loss. This is likely related to obstruction of right lower lobe pulmonary bronchi possibly from mucus inspissation given somewhat tubular hypodensity new noted within the visualized bronchi. Alternatively an endobronchial lesion is not excluded or unlikely an extrinsic infrahilar mass causing extrinsic impression given the subtle tubular soft tissue opacities noted within the expected bronchi. Musculoskeletal: Superior endplate Schmorl's node of T10. No aggressive osseous lesions. CT ABDOMEN PELVIS FINDINGS Hepatobiliary: Inhomogeneous masslike hypodensities are noted of the liver concerning for liver lesions possibly metastatic given findings in the chest. No biliary dilatation is identified. The gallbladder is unremarkable. Pancreas: Normal Spleen: Normal size spleen without focal  mass. Adrenals/Urinary Tract: Status  post left nephrectomy. Right adrenal gland and kidney are nonacute with compensatory hypertrophy of the right kidney. Punctate renal pelvic calcifications seen on the right possibly vascular or a nonobstructing calculus. Left adrenal gland is not well visualized and may be surgically absent as well. Nodularity in the expected location of the adrenal gland is felt to represent partial volume averaging of the tail of the pancreas. Stomach/Bowel: The stomach is decompressed in appearance. Normal small bowel rotation without obstruction or inflammation. The distal and terminal ileum are normal. Normal appearing appendix is noted. Increased stool burden within the colon compatible constipation. No large bowel inflammation. Partial colectomy on the left with sutures noted in the region of the rectosigmoid. Vascular/Lymphatic: Moderate to marked atherosclerosis of the abdominal aorta and branch vessels. Small retroperitoneal lymph nodes are identified without pathologic enlargement. Reproductive: Hysterectomy.  No adnexal mass. Other: No free air nor free fluid. Musculoskeletal: Degenerative disc disease L1 through L4. No aggressive osseous lesions identified. IMPRESSION: 1. Partial right lower lobe collapse/atelectasis with displacement of the right major fissure and caudal displacement of the right hilum. Differential considerations for this finding may be secondary to mucous impaction. A right infrahilar mass or endobronchial lesion causing the atelectasis are not entirely excluded. Mediastinal lymphadenopathy is also noted concerning for possible metastatic disease though reactive lymph nodes might also account for this. 2. Subtle hypodensity masslike abnormalities of the unenhanced liver worrisome for possible metastatic disease in light of the pulmonary findings. CT or MRI with IV contrast would be preferable for further assessment. 3. Left nephrectomy. Compensatory hypertrophy of  the right kidney with nonobstructing interpolar calculus possibly vascular. 4. Partial left colectomy. 5. Moderate atherosclerosis of the abdominal aorta and branch vessels. Electronically Signed   By: Ashley Royalty M.D.   On: 08/02/2017 21:33    Pending Labs Unresulted Labs (From admission, onward)   Start     Ordered   07/30/17 0500  Creatinine, serum  (enoxaparin (LOVENOX)    CrCl >/= 30 ml/min)  Weekly,   R    Comments:  while on enoxaparin therapy    07/04/2017 2322   07/24/17 0500  CBC  Tomorrow morning,   R     07/10/2017 2311   07/24/17 0500  Comprehensive metabolic panel  Tomorrow morning,   R     07/28/2017 2311   07/24/17 0500  Lipid panel  Tomorrow morning,   R     07/16/2017 2315   07/08/2017 2324  D-dimer, quantitative (not at [facility])  Add-on,   R     07/07/2017 2324   07/19/2017 2322  Legionella Pneumophila Serogp 1 Ur Ag  Once,   R     07/28/2017 2322   07/16/2017 2321  Culture, blood (routine x 2) Call MD if unable to obtain prior to antibiotics being given  BLOOD CULTURE X 2,   R    Comments:  If blood cultures drawn in Emergency Department - Do not draw and cancel order   Question:  Patient immune status  Answer:  Normal   07/27/2017 2322   07/30/2017 2321  Culture, sputum-assessment  Once,   R    Question:  Patient immune status  Answer:  Normal   07/30/2017 2322   07/17/2017 2321  Gram stain  Once,   R    Question:  Patient immune status  Answer:  Normal   07/26/2017 2322   07/15/2017 2321  HIV antibody (Routine Screening)  Once,   R     07/04/2017 2322  08/01/2017 2321  Strep pneumoniae urinary antigen  Once,   R     07/14/2017 2322   07/18/2017 2313  Troponin I (q 6hr x 3)  Now then every 6 hours,   R     07/16/2017 2315   08/01/2017 2312  Hepatitis panel, acute  Add-on,   R     07/18/2017 2311      Vitals/Pain Today's Vitals   07/11/2017 2230 07/10/2017 2300 07/31/2017 2330 07/27/2017 2339  BP: (!) 146/86 (!) 136/93 138/87   Pulse: 96 92 94   Resp: 18 (!) 24 17   Temp:      TempSrc:      SpO2: 98%  98% 98% 100%  Weight:      PainSc:        Isolation Precautions No active isolations  Medications Medications  aspirin EC tablet 325 mg (has no administration in time range)  lipase/protease/amylase (CREON) capsule 12,000 Units (has no administration in time range)  nicotine (NICODERM CQ - dosed in mg/24 hours) patch 21 mg (has no administration in time range)  mometasone-formoterol (DULERA) 100-5 MCG/ACT inhaler 2 puff (2 puffs Inhalation Not Given 07/26/2017 2345)  traMADol (ULTRAM) tablet 50 mg (has no administration in time range)  ondansetron (ZOFRAN) injection 4 mg (4 mg Intravenous Given 07/19/2017 2353)  feeding supplement (PRO-STAT SUGAR FREE 64) liquid 30 mL (30 mLs Oral Given 07/24/17 0001)  guaiFENesin (MUCINEX) 12 hr tablet 600 mg (600 mg Oral Given 07/30/2017 2354)  enoxaparin (LOVENOX) injection 40 mg (has no administration in time range)  ceFEPIme (MAXIPIME) 1 g in sodium chloride 0.9 % 100 mL IVPB (has no administration in time range)  methylPREDNISolone sodium succinate (SOLU-MEDROL) 125 mg/2 mL injection 80 mg (80 mg Intravenous Given 08/02/2017 2354)  tiotropium (SPIRIVA) inhalation capsule 18 mcg (has no administration in time range)  albuterol (PROVENTIL) (2.5 MG/3ML) 0.083% nebulizer solution 2.5 mg (2.5 mg Nebulization Given 07/20/2017 2339)  vancomycin (VANCOCIN) IVPB 750 mg/150 ml premix (has no administration in time range)  albuterol (PROVENTIL) (2.5 MG/3ML) 0.083% nebulizer solution 2.5 mg (has no administration in time range)  sodium chloride 0.9 % bolus 1,000 mL (0 mLs Intravenous Stopped 07/14/2017 2202)  ondansetron (ZOFRAN) injection 4 mg (4 mg Intravenous Given 07/10/2017 2031)  morphine 4 MG/ML injection 4 mg (4 mg Intravenous Given 07/20/2017 2032)  vancomycin (VANCOCIN) IVPB 1000 mg/200 mL premix (0 mg Intravenous Stopped 07/29/2017 2353)  ceFEPIme (MAXIPIME) 2 g in sodium chloride 0.9 % 100 mL IVPB (0 g Intravenous Stopped 07/05/2017 2230)    Mobility walks

## 2017-07-24 NOTE — ED Notes (Signed)
First set of blood cultures drawn before antibiotics

## 2017-07-25 DIAGNOSIS — R918 Other nonspecific abnormal finding of lung field: Secondary | ICD-10-CM

## 2017-07-25 LAB — COMPREHENSIVE METABOLIC PANEL
ALT: 105 U/L — ABNORMAL HIGH (ref 0–44)
ANION GAP: 10 (ref 5–15)
AST: 132 U/L — ABNORMAL HIGH (ref 15–41)
Albumin: 2.5 g/dL — ABNORMAL LOW (ref 3.5–5.0)
Alkaline Phosphatase: 417 U/L — ABNORMAL HIGH (ref 38–126)
BUN: 14 mg/dL (ref 8–23)
CO2: 24 mmol/L (ref 22–32)
Calcium: 8.5 mg/dL — ABNORMAL LOW (ref 8.9–10.3)
Chloride: 104 mmol/L (ref 98–111)
Creatinine, Ser: 0.64 mg/dL (ref 0.44–1.00)
GFR calc non Af Amer: >60 mL/min (ref >60)
Glucose, Bld: 129 mg/dL — ABNORMAL HIGH (ref 70–99)
POTASSIUM: 4 mmol/L (ref 3.5–5.1)
Sodium: 138 mmol/L (ref 135–145)
Total Bilirubin: 0.7 mg/dL (ref 0.3–1.2)
Total Protein: 5.7 g/dL — ABNORMAL LOW (ref 6.5–8.1)

## 2017-07-25 LAB — CBC
HCT: 37.8 % (ref 36.0–46.0)
Hemoglobin: 12.2 g/dL (ref 12.0–15.0)
MCH: 29 pg (ref 26.0–34.0)
MCHC: 32.3 g/dL (ref 30.0–36.0)
MCV: 89.8 fL (ref 78.0–100.0)
Platelets: 280 10*3/uL (ref 150–400)
RBC: 4.21 MIL/uL (ref 3.87–5.11)
RDW: 15.4 % (ref 11.5–15.5)
WBC: 8.6 10*3/uL (ref 4.0–10.5)

## 2017-07-25 LAB — HEPATITIS PANEL, ACUTE
HEP B C IGM: NEGATIVE
HEP B S AG: NEGATIVE
Hep A IgM: NEGATIVE

## 2017-07-25 LAB — PROTIME-INR
INR: 1.23
PROTHROMBIN TIME: 15.4 s — AB (ref 11.4–15.2)

## 2017-07-25 LAB — HIV ANTIBODY (ROUTINE TESTING W REFLEX): HIV Screen 4th Generation wRfx: NONREACTIVE

## 2017-07-25 LAB — PROCALCITONIN: Procalcitonin: 2.62 ng/mL

## 2017-07-25 LAB — GLUCOSE, CAPILLARY: Glucose-Capillary: 134 mg/dL — ABNORMAL HIGH (ref 70–99)

## 2017-07-25 LAB — LEGIONELLA PNEUMOPHILA SEROGP 1 UR AG: L. pneumophila Serogp 1 Ur Ag: NEGATIVE

## 2017-07-25 LAB — MRSA PCR SCREENING: MRSA BY PCR: NEGATIVE

## 2017-07-25 LAB — MAGNESIUM: MAGNESIUM: 1.8 mg/dL (ref 1.7–2.4)

## 2017-07-25 MED ORDER — OXYCODONE-ACETAMINOPHEN 5-325 MG PO TABS: 1.0000 | ORAL_TABLET | Freq: Four times a day (QID) | ORAL | Status: DC | PRN

## 2017-07-25 MED ORDER — ZOLPIDEM TARTRATE 5 MG PO TABS: 5.0000 mg | ORAL_TABLET | Freq: Every evening | ORAL | Status: DC | PRN

## 2017-07-25 MED ORDER — IPRATROPIUM BROMIDE 0.03 % NA SOLN
2.0000 | Freq: Three times a day (TID) | NASAL | Status: DC
  Filled 2017-07-25: qty 30

## 2017-07-25 MED ORDER — LORAZEPAM 0.5 MG PO TABS
0.5000 mg | ORAL_TABLET | Freq: Two times a day (BID) | ORAL | Status: DC | PRN
  Administered 2017-07-25 – 2017-07-29 (×2): 0.5 mg via ORAL
  Filled 2017-07-25 (×2): qty 1

## 2017-07-25 MED ORDER — HYDROMORPHONE HCL 1 MG/ML IJ SOLN: 0.5000 mg | INTRAMUSCULAR | Status: DC | PRN

## 2017-07-25 MED ORDER — OXYCODONE-ACETAMINOPHEN 5-325 MG PO TABS
1.0000 | ORAL_TABLET | Freq: Four times a day (QID) | ORAL | Status: DC | PRN
  Administered 2017-07-25: 1 via ORAL
  Filled 2017-07-25: qty 1

## 2017-07-25 MED ORDER — ENOXAPARIN SODIUM 40 MG/0.4ML ~~LOC~~ SOLN
40.0000 mg | SUBCUTANEOUS | Status: DC
  Administered 2017-07-27 – 2017-07-30 (×4): 40 mg via SUBCUTANEOUS
  Filled 2017-07-25 (×5): qty 0.4

## 2017-07-25 MED ORDER — PREDNISONE 20 MG PO TABS
40.0000 mg | ORAL_TABLET | Freq: Every day | ORAL | Status: DC
  Administered 2017-07-26: 40 mg via ORAL
  Filled 2017-07-25: qty 2

## 2017-07-25 MED ORDER — OXYCODONE-ACETAMINOPHEN 5-325 MG PO TABS
1.0000 | ORAL_TABLET | ORAL | Status: DC | PRN
  Administered 2017-07-25 – 2017-07-26 (×3): 1 via ORAL
  Filled 2017-07-25 (×3): qty 1

## 2017-07-25 MED ORDER — OXYCODONE-ACETAMINOPHEN 5-325 MG PO TABS: 1.0000 | ORAL_TABLET | ORAL | Status: DC | PRN

## 2017-07-25 MED ORDER — HYDROMORPHONE HCL 1 MG/ML IJ SOLN
0.5000 mg | INTRAMUSCULAR | Status: DC | PRN
  Administered 2017-07-25 – 2017-07-29 (×13): 0.5 mg via INTRAVENOUS
  Filled 2017-07-25 (×14): qty 0.5

## 2017-07-25 NOTE — Progress Notes (Signed)
Palliative Care consult received and case discussed with Dr. Doylene Canard results of biopsy and plan of care. We are happy to support this patient through this diagnosis and decision making with the care team moving forward. Dr. Posey Pronto will re-consult when servicess are needed. Please call us if her condition changes or she has urgent symptom management needs.  Lane Hacker, DO Palliative Medicine [phone]

## 2017-07-25 NOTE — Progress Notes (Signed)
[facility] Progress Note  Patient: Wanda James QAS:[id number]   PCP: Gaye Alken, PA-C DOB: Jun 05, 1955   DOA: 07/17/2017   DOS: 07/25/2017   Date of Service: the patient was seen and examined on 07/25/2017  Subjective: Continues to have severe pain in her stomach as well as chest.  No nausea no vomiting no fever no chills.  Had a bowel movement yesterday.  No abdominal pain.  Brief hospital course: Pt. with PMH of COPD, arthritis, renal cell carcinoma, S/P nephrectomy; admitted on 07/05/2017, presented with complaint of cough and shortness of breath, was found to have lung mass with liver metastasis, postobstructive pneumonia.  [facility] consulted who felt that IR guided biopsy is a better option.  IR consulted.  Biopsy scheduled for tomorrow. Currently further plan is continue pain control.  Assessment and Plan: 1.  Right hilar mass. Liver metastasis. Presents with cough and shortness of breath and chest pain. Suspected to have pneumonia with a work-up was suggestive of for large right mass. Mediastinal and liver metastasis is suspected as well. Pulmonary consulted and felt that the patient should get liver biopsy to help with staging as well. IR consulted and the biopsy is scheduled for 07/26/2017. If the biopsy is negative patient will require bronchoscopy and biopsy as well. Currently no significant wheezing appreciated.  2.  Postobstructive pneumonia. Suspected healthcare associated pneumonia. CT chest also suggested a possible postobstructive pneumonia versus atelectasis. Pulmonary consulted as well. Patient was on IV Solu-Medrol although I do not suspect that the patient does have any active COPD exacerbation and therefore will transition to oral prednisone. MRSA PCR is negative therefore will discontinue vancomycin. Blood cultures so far negative as well. Continue cefepime for now and will likely transition her to oral Augmentin for the same. Monitor other recommendation  from pulmonary. History of COPD continue home inhalers.  3.  Chest pain. Anxiety. Chronic low back pain. Patient is on tramadol for pain control which is not adequate and therefore I will increase her pain regimen to Percocet. Continue Dilaudid for now. Patient was informed that I would prefer to use oral narcotics or IV to monitor her response to this medication so that further adjustment can be made. She may require scheduled long-term narcotics. Currently significant anxious and in distress, PRN Ativan ordered. If continues to have significant anxiety and difficult control pain and will consult palliative care for input. Currently do not have a diagnosis and therefore do not have a plan of care regarding her cancer and therefore will hold off on palliative care consultation for goals of care.  4.  History of renal cell carcinoma S/P left renal nephrectomy. Avoid nephrotoxic agent. Monitor.  5 active smoker. . More than 40 years of history. Nicotine patch.  Type 2 diabetes mellitus. 6.  Continue sliding scale insulin.  Diet: carb modified diet DVT Prophylaxis: subcutaneous Heparin  Advance goals of care discussion: full code  Family Communication: family was present at bedside, at the time of interview. The pt provided permission to discuss medical plan with the family. Opportunity was given to ask question and all questions were answered satisfactorily.   Disposition:  Discharge to be determined.  Consultants: PCCM, IR Procedures: none  Antibiotics: Anti-infectives (From admission, onward)   Start     Dose/Rate Route Frequency Ordered Stop   07/24/17 2200  vancomycin (VANCOCIN) IVPB 750 mg/150 ml premix  Status:  Discontinued     750 mg 150 mL/hr over 60 Minutes Intravenous Every 24 hours 07/17/2017 2334 07/25/17  1327   07/24/17 0600  ceFEPIme (MAXIPIME) 1 g in sodium chloride 0.9 % 100 mL IVPB     1 g 200 mL/hr over 30 Minutes Intravenous Every 8 hours 07/30/2017 2322  08/01/17 0559   07/29/2017 2145  vancomycin (VANCOCIN) IVPB 1000 mg/200 mL premix     1,000 mg 200 mL/hr over 60 Minutes Intravenous  Once 07/04/2017 2143 07/25/2017 2353   07/14/2017 2145  ceFEPIme (MAXIPIME) 2 g in sodium chloride 0.9 % 100 mL IVPB     2 g 200 mL/hr over 30 Minutes Intravenous  Once 08/01/2017 2143 07/14/2017 2230       Objective: Physical Exam: Vitals:   07/24/17 2117 07/25/17 0500 07/25/17 0817 07/25/17 1617  BP: 114/74   117/75  Pulse: 99   95  Resp: 18   16  Temp: 98 F (36.7 C)   98 F (36.7 C)  TempSrc: Oral   Oral  SpO2: 96%  93% 95%  Weight:      Height:  5\' 2"  (1.575 m)      Intake/Output Summary (Last 24 hours) at 07/25/2017 1830 Last data filed at 07/25/2017 1804 Gross per 24 hour  Intake 930 ml  Output 1 ml  Net 929 ml   Filed Weights   07/14/2017 1949 07/24/17 0055  Weight: 51.3 kg (113 lb) 50.7 kg (111 lb 12.4 oz)   General: Alert, Awake and Oriented to Time, Place and Person. Appear in marked distress, affect labile Eyes: PERRL, Conjunctiva normal ENT: Oral Mucosa clear moist. Neck: no JVD, no Abnormal Mass Or lumps Cardiovascular: S1 and S2 Present, no Murmur, Peripheral Pulses Present Respiratory: normal respiratory effort, Bilateral Air entry equal and Decreased, no use of accessory muscle, basal Crackles, no wheezes Abdomen: Bowel Sound present, Soft and mild tenderness, no hernia Skin: no redness, no Rash, no induration Extremities: no Pedal edema, no calf tenderness Neurologic: Grossly no focal neuro deficit. Bilaterally Equal motor strength  Data Reviewed: CBC: Recent Labs  Lab 07/12/2017 2025 07/11/2017 2026 07/24/17 0443 07/25/17 0445  WBC 6.8  --  5.9 8.6  NEUTROABS 5.0  --   --   --   HGB 14.5 15.6* 13.2 12.2  HCT 43.7 46.0 40.8 37.8  MCV 88.6  --  90.1 89.8  PLT 304  --  254 638   Basic Metabolic Panel: Recent Labs  Lab 07/24/2017 2025 07/29/2017 2026 07/24/17 0443 07/25/17 0445  NA 138 136 138 138  K 3.7 3.7 4.4 4.0  CL 99  99 104 104  CO2 26  --  25 24  GLUCOSE 118* 116* 143* 129*  BUN 11 9 11 14   CREATININE 0.81 0.80 0.74 0.64  CALCIUM 9.1  --  8.6* 8.5*  MG  --   --   --  1.8    Liver Function Tests: Recent Labs  Lab 07/09/2017 2025 07/24/17 0443 07/25/17 0445  AST 193* 168* 132*  ALT 129* 114* 105*  ALKPHOS 518* 479* 417*  BILITOT 0.3 0.8 0.7  PROT 7.0 6.0* 5.7*  ALBUMIN 2.9* 2.6* 2.5*   Recent Labs  Lab 07/06/2017 2025  LIPASE 99*   No results for input(s): AMMONIA in the last 168 hours. Coagulation Profile: Recent Labs  Lab 07/25/17 1124  INR 1.23   Cardiac Enzymes: Recent Labs  Lab 07/15/2017 2025 07/18/2017 2359 07/24/17 0443 07/24/17 1116  TROPONINI <0.03 <0.03 <0.03 <0.03   BNP (last 3 results) No results for input(s): PROBNP in the last 8760 hours. CBG: No results  for input(s): GLUCAP in the last 168 hours. Studies: No results found.  Scheduled Meds: . albuterol  2.5 mg Nebulization TID  . aspirin EC  325 mg Oral Daily  . [START ON 07/26/2017] enoxaparin (LOVENOX) injection  40 mg Subcutaneous Q24H  . feeding supplement (PRO-STAT SUGAR FREE 64)  30 mL Oral BID  . guaiFENesin  600 mg Oral BID  . lipase/protease/amylase  12,000 Units Oral TID AC  . mouth rinse  15 mL Mouth Rinse BID  . mometasone-formoterol  2 puff Inhalation BID  . nicotine  21 mg Transdermal Daily  . [START ON 07/26/2017] predniSONE  40 mg Oral Q breakfast  . tiotropium  18 mcg Inhalation Daily   Continuous Infusions: . ceFEPime (MAXIPIME) IV Stopped (07/25/17 1420)   PRN Meds: albuterol, HYDROmorphone (DILAUDID) injection, LORazepam, ondansetron (ZOFRAN) IV, oxyCODONE-acetaminophen, promethazine, zolpidem  Time spent: 35 minutes  Author: Berle Mull, MD [facility] Pager: [phone] 07/25/2017 6:30 PM  If 7PM-7AM, please contact night-coverage at [email or web address], password [facility]

## 2017-07-25 NOTE — Consult Note (Signed)
Chief Complaint: Patient was seen in consultation today for liver lesion  Referring Physician(s): Dr. Nevada Crane  Supervising Physician: Marybelle Killings  Patient Status: [facility] - In-pt  History of Present Illness: Wanda James is a 62 y.o. female with past medical history of arthritis, COPD, Chiari malformation Type 1, renal cell carcinoma presented to [facility] ED with chest pain associated with wheezing and cough.   CT Chest 7/20 showed: 1. Partial right lower lobe collapse/atelectasis with displacement of the right major fissure and caudal displacement of the right hilum. Differential considerations for this finding may be secondary to mucous impaction. A right infrahilar mass or endobronchial lesion causing the atelectasis are not entirely excluded. Mediastinal lymphadenopathy is also noted concerning for possible metastatic disease though reactive lymph nodes might also account for this. 2. Subtle hypodensity masslike abnormalities of the unenhanced liver worrisome for possible metastatic disease in light of the pulmonary findings. CT or MRI with IV contrast would be preferable for further assessment.  MR Abdomen 7/21 showed: 1. Large RIGHT hilar mass surrounds the bronchus intermedius and extends into the mediastinum consistent with primary malignancy versus metastatic lesion. This lesion obstructs the RIGHT lower bronchus with complete atelectasis of the RIGHT lower lobe. 2. 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 Lesions reached near confluence and and number approximately 100. 3. LEFT nephrectomy.  Normal hypertrophy RIGHT kidney. 4. No bony metastasis identified  IR consulted for liver lesion biopsy at the request of Dr. Nevada Crane.  Pulmonology has also been consulted, however possible post obstructive PNA  Past Medical History:  Diagnosis Date  . Acid reflux   . Arthritis   . Cancer ([place])    kidney  . Cataract   . Chiari malformation type I ([place])   . Complication of  anesthesia    Hard To Awaken  . COPD (chronic obstructive pulmonary disease) ([place])   . Diverticulitis   . Headache   . Pneumonia   . Stroke ([place])   . Subclavian artery stenosis, left (HCC)    s/p angioplasty/stent 04/25/08    Past Surgical History:  Procedure Laterality Date  . ABDOMINAL HYSTERECTOMY     partial  . BREAST SURGERY     left lumpectomy  . carotidendarterectomy    . COLON SURGERY    . EYE SURGERY    . INNER EAR SURGERY     right  . LUMBAR LAMINECTOMY/DECOMPRESSION MICRODISCECTOMY N/A 08/19/2016   Procedure: LUMBAR 4-5 DECOMPRESSION;  Surgeon: Phylliss Bob, MD;  Location: [place];  Service: Orthopedics;  Laterality: N/A;  LUMBAR 4-5 DECOMPRESSION REQUESTED TIME 2.5 HRS   . NEPHRECTOMY     left  . PARTIAL COLECTOMY    . RHINOPLASTY    . TONSILLECTOMY    . TUBAL LIGATION      Allergies: Iohexol; Sodium hypochlorite; Penicillins; Hydrocodone; and Tape  Medications: Prior to Admission medications   Medication Sig Start Date End Date Taking? Authorizing Provider  aspirin EC 325 MG tablet Take 325 mg by mouth daily.   Yes [provider]  clarithromycin (BIAXIN) 500 MG tablet TK 1 T PO BID 07/19/17  Yes [provider]  CREON 24000-76000 units CPEP TK 1 C PO TID WITH MEALS AND WITH EVENING SNACKS 06/15/17  Yes [provider]  Melatonin 3 MG TABS Take 3 mg by mouth at bedtime.   Yes [provider]  nicotine (NICODERM CQ - DOSED IN MG/24 HOURS) 21 mg/24hr patch APP 1 PA EXT TO THE SKIN D 07/20/17  Yes  [provider]  ondansetron (ZOFRAN) 8 MG tablet TK 1 T PO TID PRN NAUSEA AND VOMITING 07/20/17  Yes [provider]  promethazine (PHENERGAN) 25 MG tablet TK 1/2 T PO BID PRF NAUSEA 05/13/17  Yes [provider]  SYMBICORT 80-4.5 MCG/ACT inhaler INHALE 2 PUFFS PO BID PRN FOR SOB AND WHEEZING 07/20/17  Yes [provider]  traMADol (ULTRAM) 50 MG tablet Take 50 mg by mouth every 6 (six) hours as needed  for severe pain.   Yes [provider]  acetaminophen (TYLENOL) 325 MG tablet Take 2 tablets (650 mg total) by mouth every 6 (six) hours as needed for mild pain (or Fever >/= 101). 07/01/17   Debbe Odea, MD  albuterol (PROVENTIL) (2.5 MG/3ML) 0.083% nebulizer solution Take 3 mLs (2.5 mg total) by nebulization every 2 (two) hours as needed for wheezing or shortness of breath. 07/01/17   Debbe Odea, MD  dextromethorphan-guaiFENesin (MUCINEX DM) 30-600 MG 12hr tablet Take 1 tablet by mouth 2 (two) times daily. Patient not taking: Reported on 07/12/2017 07/01/17   Debbe Odea, MD  diphenhydrAMINE (BENADRYL) 25 MG tablet Take 25 mg by mouth 2 (two) times daily as needed (allergic reactions).    [provider]  docusate sodium (COLACE) 100 MG capsule Take 100 mg by mouth as needed for mild constipation.    [provider]  glucose monitoring kit (FREESTYLE) monitoring kit 1 each by Does not apply route 4 (four) times daily - after meals and at bedtime. 1 month Diabetic Testing Supplies for QAC-QHS accuchecks. 07/01/17   Debbe Odea, MD  ipratropium-albuterol (DUONEB) 0.5-2.5 (3) MG/3ML SOLN Take 3 mLs by nebulization every 6 (six) hours as needed. Patient taking differently: Take 3 mLs by nebulization every 6 (six) hours as needed (sob and wheezing).  07/01/17   Debbe Odea, MD  levofloxacin (LEVAQUIN) 750 MG tablet Take 1 tablet (750 mg total) by mouth daily. Patient not taking: Reported on 07/04/2017 07/01/17   Debbe Odea, MD  nicotine (NICODERM CQ) 14 mg/24hr patch Place 1 patch (14 mg total) onto the skin daily. Start when the 21 mg patches are finished Patient not taking: Reported on 07/18/2017 07/01/17   Debbe Odea, MD  nicotine (NICODERM CQ) 7 mg/24hr patch Place 1 patch (7 mg total) onto the skin daily. Patient not taking: Reported on 07/18/2017 07/01/17   Debbe Odea, MD  PROAIR HFA 108 [phone] Base) MCG/ACT inhaler INL 1 INHALATION PO QID PRN FOR SOB AND WHEEZING  07/19/17   [provider]     Family History  Problem Relation Age of Onset  . Lung cancer Father     Social History   Socioeconomic History  . Marital status: Married    Spouse name: Not on file  . Number of children: Not on file  . Years of education: Not on file  . Highest education level: Not on file  Occupational History  . Not on file  Social Needs  . Financial resource strain: Not on file  . Food insecurity:    Worry: Not on file    Inability: Not on file  . Transportation needs:    Medical: Not on file    Non-medical: Not on file  Tobacco Use  . Smoking status: Current Every Day Smoker    Packs/day: 1.00    Types: Cigarettes  . Smokeless tobacco: Never Used  Substance and Sexual Activity  . Alcohol use: No  . Drug use: No  . Sexual activity: Not on  file  Lifestyle  . Physical activity:    Days per week: Not on file    Minutes per session: Not on file  . Stress: Not on file  Relationships  . Social connections:    Talks on phone: Not on file    Gets together: Not on file    Attends religious service: Not on file    Active member of club or organization: Not on file    Attends meetings of clubs or organizations: Not on file    Relationship status: Not on file  Other Topics Concern  . Not on file  Social History Narrative  . Not on file     Review of Systems: A 12 point ROS discussed and pertinent positives are indicated in the HPI above.  All other systems are negative.  Review of Systems  Constitutional: Negative for fatigue and fever.  Respiratory: Positive for cough (baseline) and shortness of breath (baseline).   Cardiovascular: Positive for chest pain (on admission, improved now).  Gastrointestinal: Negative for abdominal pain.  Genitourinary: Negative for dysuria.  Musculoskeletal: Negative for back pain.  Psychiatric/Behavioral: Negative for behavioral problems and confusion.    Vital Signs: BP 114/74 (BP Location: Left Arm)    Pulse 99   Temp 98 F (36.7 C) (Oral)   Resp 18   Ht 5' 2" (1.575 m)   Wt 111 lb 12.4 oz (50.7 kg)   SpO2 93%   BMI 20.44 kg/m   Physical Exam  Constitutional: She is oriented to person, place, and time. She appears well-developed. She does not appear ill. No distress.  Neck: Normal range of motion. Neck supple. No tracheal deviation present.  Cardiovascular: Normal rate, regular rhythm and normal heart sounds. Exam reveals no gallop and no friction rub.  No murmur heard. Pulmonary/Chest: Effort normal and breath sounds normal. No respiratory distress.  Abdominal: Soft. She exhibits no distension. There is no tenderness.  Neurological: She is alert and oriented to person, place, and time.  Skin: Skin is warm and dry.  Psychiatric: She has a normal mood and affect. Her behavior is normal. Judgment and thought content normal.  Nursing note and vitals reviewed.    MD Evaluation Airway: WNL Heart: WNL Abdomen: WNL Chest/ Lungs: WNL ASA  Classification: 3 Mallampati/Airway Score: One   Imaging: Ct Abdomen Pelvis Wo Contrast  Result Date: 07/04/2017 CLINICAL DATA:  Mid chest and back pain. Patient was recently treated for pneumonia and has L4 and L5 fractures coughing. EXAM: CT CHEST, ABDOMEN AND PELVIS WITHOUT CONTRAST TECHNIQUE: Multidetector CT imaging of the chest, abdomen and pelvis was performed following the standard protocol without IV contrast. COMPARISON:  CXR from the same day, 06/29/2017 CXR and 01/31/2015 CT AP FINDINGS: CT CHEST FINDINGS Cardiovascular: Conventional branch pattern of the great vessels with atherosclerosis of the proximal great vessels. Mild atherosclerosis of the nonaneurysmal thoracic aorta measuring up to 3.3 cm along the ascending portion. Nondilated main pulmonary artery. Heart size is normal without pericardial effusion. Minimal coronary arteriosclerosis along the distal LAD. Mediastinum/Nodes: Mediastinal adenopathy is identified with largest lymph  node right lower paratracheal measuring 2.2 cm in short axis. A 1.1 cm AP window lymph node is also noted. The trachea and mainstem bronchi are patent. Occlusion of the right lower lobe bronchus and branch vessels is noted. Lungs/Pleura: There is a confluent area of partially atelectatic/partially collapsed right lower lobe with displacement of the major fissure and inferior displacement of the right hilum from volume loss. This is likely  related to obstruction of right lower lobe pulmonary bronchi possibly from mucus inspissation given somewhat tubular hypodensity new noted within the visualized bronchi. Alternatively an endobronchial lesion is not excluded or unlikely an extrinsic infrahilar mass causing extrinsic impression given the subtle tubular soft tissue opacities noted within the expected bronchi. Musculoskeletal: Superior endplate Schmorl's node of T10. No aggressive osseous lesions. CT ABDOMEN PELVIS FINDINGS Hepatobiliary: Inhomogeneous masslike hypodensities are noted of the liver concerning for liver lesions possibly metastatic given findings in the chest. No biliary dilatation is identified. The gallbladder is unremarkable. Pancreas: Normal Spleen: Normal size spleen without focal mass. Adrenals/Urinary Tract: Status post left nephrectomy. Right adrenal gland and kidney are nonacute with compensatory hypertrophy of the right kidney. Punctate renal pelvic calcifications seen on the right possibly vascular or a nonobstructing calculus. Left adrenal gland is not well visualized and may be surgically absent as well. Nodularity in the expected location of the adrenal gland is felt to represent partial volume averaging of the tail of the pancreas. Stomach/Bowel: The stomach is decompressed in appearance. Normal small bowel rotation without obstruction or inflammation. The distal and terminal ileum are normal. Normal appearing appendix is noted. Increased stool burden within the colon compatible  constipation. No large bowel inflammation. Partial colectomy on the left with sutures noted in the region of the rectosigmoid. Vascular/Lymphatic: Moderate to marked atherosclerosis of the abdominal aorta and branch vessels. Small retroperitoneal lymph nodes are identified without pathologic enlargement. Reproductive: Hysterectomy.  No adnexal mass. Other: No free air nor free fluid. Musculoskeletal: Degenerative disc disease L1 through L4. No aggressive osseous lesions identified. IMPRESSION: 1. Partial right lower lobe collapse/atelectasis with displacement of the right major fissure and caudal displacement of the right hilum. Differential considerations for this finding may be secondary to mucous impaction. A right infrahilar mass or endobronchial lesion causing the atelectasis are not entirely excluded. Mediastinal lymphadenopathy is also noted concerning for possible metastatic disease though reactive lymph nodes might also account for this. 2. Subtle hypodensity masslike abnormalities of the unenhanced liver worrisome for possible metastatic disease in light of the pulmonary findings. CT or MRI with IV contrast would be preferable for further assessment. 3. Left nephrectomy. Compensatory hypertrophy of the right kidney with nonobstructing interpolar calculus possibly vascular. 4. Partial left colectomy. 5. Moderate atherosclerosis of the abdominal aorta and branch vessels. Electronically Signed   By: Ashley Royalty M.D.   On: 07/12/2017 21:33   Dg Chest 2 View  Result Date: 07/28/2017 CLINICAL DATA:  Chest pain EXAM: CHEST - 2 VIEW COMPARISON:  June 29, 2017 FINDINGS: There is extensive right lower lobe airspace consolidation consistent with pneumonia. Lungs elsewhere clear. Heart size and pulmonary vascularity are normal. No adenopathy. There is an old healed fracture of the lateral right sixth rib. There is aortic atherosclerosis. IMPRESSION: Extensive consolidation right lower lobe consistent with  pneumonia. Lungs elsewhere clear. Heart size normal. There is aortic atherosclerosis. Aortic Atherosclerosis (ICD10-I70.0). Electronically Signed   By: Lowella Grip III M.D.   On: 07/22/2017 20:57   Dg Chest 2 View  Result Date: 06/26/2017 CLINICAL DATA:  Pt c/o SOB x2-3 weeks. Hx of COPD, PNA. Smokes 1 ppd. EXAM: CHEST - 2 VIEW COMPARISON:  03/05/2015 FINDINGS: Cardiac silhouette is normal in size. No mediastinal or hilar masses. No evidence of adenopathy. Lungs are mildly hyperexpanded. There are reticular and linear lung base opacities, greater on the right, similar to the prior study. It intervening hazy airspace opacity is noted in the posterolateral right lower lobe,  less confluent than noted on the prior exam. Remainder of the lungs is clear. No pleural effusion or pneumothorax. Skeletal structures are intact. IMPRESSION: 1. No convincing acute cardiopulmonary disease. 2. Right greater than left lung base opacities most likely due to interstitial fibrosis. Electronically Signed   By: Lajean Manes M.D.   On: 06/26/2017 15:32   Ct Chest Wo Contrast  Result Date: 07/10/2017 CLINICAL DATA:  Mid chest and back pain. Patient was recently treated for pneumonia and has L4 and L5 fractures coughing. EXAM: CT CHEST, ABDOMEN AND PELVIS WITHOUT CONTRAST TECHNIQUE: Multidetector CT imaging of the chest, abdomen and pelvis was performed following the standard protocol without IV contrast. COMPARISON:  CXR from the same day, 06/29/2017 CXR and 01/31/2015 CT AP FINDINGS: CT CHEST FINDINGS Cardiovascular: Conventional branch pattern of the great vessels with atherosclerosis of the proximal great vessels. Mild atherosclerosis of the nonaneurysmal thoracic aorta measuring up to 3.3 cm along the ascending portion. Nondilated main pulmonary artery. Heart size is normal without pericardial effusion. Minimal coronary arteriosclerosis along the distal LAD. Mediastinum/Nodes: Mediastinal adenopathy is identified with  largest lymph node right lower paratracheal measuring 2.2 cm in short axis. A 1.1 cm AP window lymph node is also noted. The trachea and mainstem bronchi are patent. Occlusion of the right lower lobe bronchus and branch vessels is noted. Lungs/Pleura: There is a confluent area of partially atelectatic/partially collapsed right lower lobe with displacement of the major fissure and inferior displacement of the right hilum from volume loss. This is likely related to obstruction of right lower lobe pulmonary bronchi possibly from mucus inspissation given somewhat tubular hypodensity new noted within the visualized bronchi. Alternatively an endobronchial lesion is not excluded or unlikely an extrinsic infrahilar mass causing extrinsic impression given the subtle tubular soft tissue opacities noted within the expected bronchi. Musculoskeletal: Superior endplate Schmorl's node of T10. No aggressive osseous lesions. CT ABDOMEN PELVIS FINDINGS Hepatobiliary: Inhomogeneous masslike hypodensities are noted of the liver concerning for liver lesions possibly metastatic given findings in the chest. No biliary dilatation is identified. The gallbladder is unremarkable. Pancreas: Normal Spleen: Normal size spleen without focal mass. Adrenals/Urinary Tract: Status post left nephrectomy. Right adrenal gland and kidney are nonacute with compensatory hypertrophy of the right kidney. Punctate renal pelvic calcifications seen on the right possibly vascular or a nonobstructing calculus. Left adrenal gland is not well visualized and may be surgically absent as well. Nodularity in the expected location of the adrenal gland is felt to represent partial volume averaging of the tail of the pancreas. Stomach/Bowel: The stomach is decompressed in appearance. Normal small bowel rotation without obstruction or inflammation. The distal and terminal ileum are normal. Normal appearing appendix is noted. Increased stool burden within the colon  compatible constipation. No large bowel inflammation. Partial colectomy on the left with sutures noted in the region of the rectosigmoid. Vascular/Lymphatic: Moderate to marked atherosclerosis of the abdominal aorta and branch vessels. Small retroperitoneal lymph nodes are identified without pathologic enlargement. Reproductive: Hysterectomy.  No adnexal mass. Other: No free air nor free fluid. Musculoskeletal: Degenerative disc disease L1 through L4. No aggressive osseous lesions identified. IMPRESSION: 1. Partial right lower lobe collapse/atelectasis with displacement of the right major fissure and caudal displacement of the right hilum. Differential considerations for this finding may be secondary to mucous impaction. A right infrahilar mass or endobronchial lesion causing the atelectasis are not entirely excluded. Mediastinal lymphadenopathy is also noted concerning for possible metastatic disease though reactive lymph nodes might also account for  this. 2. Subtle hypodensity masslike abnormalities of the unenhanced liver worrisome for possible metastatic disease in light of the pulmonary findings. CT or MRI with IV contrast would be preferable for further assessment. 3. Left nephrectomy. Compensatory hypertrophy of the right kidney with nonobstructing interpolar calculus possibly vascular. 4. Partial left colectomy. 5. Moderate atherosclerosis of the abdominal aorta and branch vessels. Electronically Signed   By: Ashley Royalty M.D.   On: 07/24/2017 21:33   Mr Abdomen W Wo Contrast  Result Date: 07/24/2017 CLINICAL DATA:  LEFT lower lobe collapse difficulty breathing. Indeterminate hepatic lesions identified on noncontrast CT. History of renal cell carcinoma. LEFT nephrectomy EXAM: MRI ABDOMEN WITHOUT AND WITH CONTRAST TECHNIQUE: Multiplanar multisequence MR imaging of the abdomen was performed both before and after the administration of intravenous contrast. CONTRAST:  3m MULTIHANCE GADOBENATE DIMEGLUMINE  529 MG/ML IV SOLN COMPARISON:  CT 07/31/2017 FINDINGS: Lower chest: A complete collapse of the RIGHT lower lobe with obstruction of the RIGHT lower lobe bronchus. On coronal imaging there is masslike lesion surrounding the bronchus intermedius measuring 6.4 by 4.1 cm (image 16/6 per Hepatobiliary: Too numerous to count round enhancing lesions extensively involving the LEFT RIGHT hepatic lobe. Lesions range in size from 10 mm to 33 mm. Approximately 100 lesions. Largest lesion occupies the LEFT hepatic lobe measures 6.4 cm large example lesion in the RIGHT hepatic lobe measures 3.6 cm in the dome (image 27/9 0 point. There is no biliary duct dilatation. The gallbladder and common bile duct are normal. Pancreas: Pancreas is normal. No ductal dilatation. No pancreatic inflammation. Spleen: Normal spleen Adrenals/urinary tract: Adrenal glands are normal. Post LEFT nephrectomy. Compensatory hypertrophy of the RIGHT kidney. No RIGHT renal lesion. Stomach/Bowel: Stomach and limited view of the bowel is unremarkable. Vascular/Lymphatic: Normal abdominal aorta. No significant adenopathy Reproductive: Other: No free fluid. Musculoskeletal: No aggressive osseous lesion. IMPRESSION: 1. Large RIGHT hilar mass surrounds the bronchus intermedius and extends into the mediastinum consistent with primary malignancy versus metastatic lesion. This lesion obstructs the RIGHT lower bronchus with complete atelectasis of the RIGHT lower lobe. 2. 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 Lesions reached near confluence and and number approximately 100. 3. LEFT nephrectomy.  Normal hypertrophy RIGHT kidney. 4. No bony metastasis identified Electronically Signed   By: SSuzy BouchardM.D.   On: 07/24/2017 15:28   Dg Chest Port 1 View  Result Date: 06/29/2017 CLINICAL DATA:  Short of breath EXAM: PORTABLE CHEST 1 VIEW COMPARISON:  06/28/2017 FINDINGS: Normal heart size. Hyperaeration. Stable airspace disease at the right  base. Persistent volume loss in the right lung with shift of the mediastinum to the right. No pneumothorax. No definite pleural effusion. IMPRESSION: Stable airspace disease at the right lung base with volume loss. Electronically Signed   By: AMarybelle KillingsM.D.   On: 06/29/2017 06:59   Dg Chest Port 1 View  Result Date: 06/28/2017 CLINICAL DATA:  Shortness of breath EXAM: PORTABLE CHEST 1 VIEW COMPARISON:  06/26/2017 FINDINGS: Cardiac shadow is stable. The lungs are well aerated bilaterally with persistent right basilar infiltrate. No focal effusion is seen. No bony abnormality is noted. IMPRESSION: Persistent right basilar infiltrate.  The left lung remains clear. Electronically Signed   By: MInez CatalinaM.D.   On: 06/28/2017 07:52    Labs:  CBC: Recent Labs    06/29/17 0511 07/31/2017 2025 07/28/2017 2026 07/24/17 0443 07/25/17 0445  WBC 11.4* 6.8  --  5.9 8.6  HGB 13.4 14.5 15.6* 13.2 12.2  HCT 41.8  43.7 46.0 40.8 37.8  PLT 296 304  --  254 280    COAGS: Recent Labs    08/16/16 1442 07/25/17 1124  INR 1.01 1.23  APTT 35  --     BMP: Recent Labs    06/29/17 0511 07/22/2017 2025 07/09/2017 2026 07/24/17 0443 07/25/17 0445  NA 141 138 136 138 138  K 3.9 3.7 3.7 4.4 4.0  CL 104 99 99 104 104  CO2 28 26  --  25 24  GLUCOSE 107* 118* 116* 143* 129*  BUN _0 CALCIUM 9.1 9.1  --  8.6* 8.5*  CREATININE 0.68 0.81 0.80 0.74 0.64  GFRNONAA >60 >60  --  >60 >60  GFRAA >60 >60  --  >60 >60    LIVER FUNCTION TESTS: Recent Labs    06/29/17 0511 07/11/2017 2025 07/24/17 0443 07/25/17 0445  BILITOT 0.3 0.3 0.8 0.7  AST 25 193* 168* 132*  ALT 18 129* 114* 105*  ALKPHOS 139* 518* 479* 417*  PROT 6.3* 7.0 6.0* 5.7*  ALBUMIN 2.9* 2.9* 2.6* 2.5*    TUMOR MARKERS: No results for input(s): AFPTM, CEA, CA199, CHROMGRNA in the last 8760 hours.  Assessment and Plan: Patient with past medical history of COPD presents with complaint of liver mass identified by MR. Also  with possible lung mass.  IR consulted for liver lesion biopsy at the request of Dr. Nevada Crane. Case reviewed by Dr. Barbie Banner who approves patient for procedure.  Patient presents today in their usual state of health.  She has been NPO and is not currently on blood thinners.   Risks and benefits discussed with the patient including, but not limited to bleeding, infection, damage to adjacent structures or low yield requiring additional tests.  All of the patient's questions were answered, patient is agreeable to proceed. Consent signed and in chart.   Thank you for this interesting consult.  I greatly enjoyed meeting Wanda James and look forward to participating in their care.  A copy of this report was sent to the requesting provider on this date.  Electronically Signed: Docia Barrier, PA 07/25/2017, 2:00 PM   I spent a total of 40 Minutes    in face to face in clinical consultation, greater than 50% of which was counseling/coordinating care for liver lesion.

## 2017-07-25 NOTE — Progress Notes (Signed)
[facility]  S: feels about the same  O:  Vitals:   07/24/17 2038 07/24/17 2117 07/25/17 0500 07/25/17 0817  BP:  114/74    Pulse:  99    Resp:  18    Temp:  98 F (36.7 C)    TempSrc:  Oral    SpO2: 94% 96%  93%  Weight:      Height:   5\' 2"  (1.575 m)    2L [place]  General:  Resting comfortably in bed HENT: NCAT OP clear PULM: Slightly diminished R base B, normal effort CV: RRR, no mgr GI: BS+, soft, nontender MSK: normal bulk and tone Neuro: awake, alert, no distress, MAEW  CT chest images personally reviewed: large R LL mass, mediastinal adenopathy, RLL consolidation MRI abdomen images personally reviewed: innumerable lesions in the liver  Impression/plan:  Lung mass, smoker with mediastinal lymphadenopathy and likely metastatic lesions in liver.  The best approach moving forward is to perform a biopsy in the liver for staging purposes.  If that cannot be accomplished then I am happy to perform a bronchoscopy.  I updated the patient's husband (my office patient) today  Roselie Awkward, MD [facility] Pager: [phone] Cell: [phone] After 3pm or if no response, call [phone]

## 2017-07-25 NOTE — Progress Notes (Signed)
Initial Nutrition Assessment  DOCUMENTATION CODES:   Not applicable  INTERVENTION:    Encourage PO intake with high protein options  NUTRITION DIAGNOSIS:   Moderate Malnutrition related to chronic illness(COPD/recurrent PNA) as evidenced by energy intake < 75% for > or equal to 1 month, percent weight loss.  GOAL:   Patient will meet greater than or equal to 90% of their needs  MONITOR:   PO intake, Supplement acceptance, Weight trends, Labs  REASON FOR ASSESSMENT:   Malnutrition Screening Tool    ASSESSMENT:   Patient with PMH significant for renal cell carcinoma, tobacco abuse, COPD, and stroke. Presents this admission with complaints of chest pain. Admitted for right lower lobe lung collapse with possible lung mass and possible mass in the liver.    Pt reports her appetite decreased over the last 2-3 weeks due to ongoing chest pain and nausea. States prior to this time period she would eat two meals/day and snacks in between. A typical day consist of breakfast- eggs, bacon, grits; snack- ice cream or cookies; dinner- [employer] meat, vegetable, and grain. She dislikes the taste of all supplements and does not wish to have them while in the hospital. Reports yesterday her intake was the most it has been in the entire week, and she did not experience any nausea. Pt is currently NPO for procedure. RD to encourage high protein high calorie PO intake.   Pt endorses a UBW of 123 lb with the last time being at that weight 2-3 weeks ago at her PCP. Records indicate pt weighed 117 lb 06/26/17 and 111 lb this admission (5% wt loss in one month, significant for time frame). Nutrition-Focused physical exam completed.   Medications reviewed and include: creon, prednisone Labs reviewed: AST 132 (H) ALT 105 (H)   NUTRITION - FOCUSED PHYSICAL EXAM:    Most Recent Value  Orbital Region  No depletion  Upper Arm Region  No depletion  Thoracic and Lumbar Region  Unable to assess  Buccal Region   No depletion  Temple Region  No depletion  Clavicle Bone Region  No depletion  Clavicle and Acromion Bone Region  No depletion  Scapular Bone Region  Unable to assess  Dorsal Hand  No depletion  Patellar Region  No depletion  Anterior Thigh Region  No depletion  Posterior Calf Region  No depletion  Edema (RD Assessment)  None  Hair  Reviewed  Eyes  Reviewed  Mouth  Reviewed  Skin  Reviewed  Nails  Reviewed     Diet Order:   Diet Order           Diet NPO time specified  Diet effective midnight          EDUCATION NEEDS:   Education needs have been addressed  Skin:  Skin Assessment: Reviewed RN Assessment  Last BM:  PTA  Height:   Ht Readings from Last 1 Encounters:  07/25/17 5\' 2"  (1.575 m)    Weight:   Wt Readings from Last 1 Encounters:  07/24/17 111 lb 12.4 oz (50.7 kg)    Ideal Body Weight:  50 kg  BMI:  Body mass index is 20.44 kg/m.  Estimated Nutritional Needs:   Kcal:  1450-1650 kcal  Protein:  70-80 grams   Fluid:  >1.4 L/day    Mariana Single RD, LDN Clinical Nutrition Pager # - [phone]

## 2017-07-26 ENCOUNTER — Inpatient Hospital Stay (HOSPITAL_COMMUNITY): Payer: BLUE CROSS/BLUE SHIELD

## 2017-07-26 DIAGNOSIS — Z801 Family history of malignant neoplasm of trachea, bronchus and lung: Secondary | ICD-10-CM

## 2017-07-26 DIAGNOSIS — J441 Chronic obstructive pulmonary disease with (acute) exacerbation: Secondary | ICD-10-CM

## 2017-07-26 DIAGNOSIS — Z85528 Personal history of other malignant neoplasm of kidney: Secondary | ICD-10-CM

## 2017-07-26 DIAGNOSIS — C801 Malignant (primary) neoplasm, unspecified: Secondary | ICD-10-CM

## 2017-07-26 DIAGNOSIS — E440 Moderate protein-calorie malnutrition: Secondary | ICD-10-CM

## 2017-07-26 DIAGNOSIS — K5909 Other constipation: Secondary | ICD-10-CM

## 2017-07-26 DIAGNOSIS — F17210 Nicotine dependence, cigarettes, uncomplicated: Secondary | ICD-10-CM

## 2017-07-26 DIAGNOSIS — G893 Neoplasm related pain (acute) (chronic): Secondary | ICD-10-CM

## 2017-07-26 DIAGNOSIS — J9610 Chronic respiratory failure, unspecified whether with hypoxia or hypercapnia: Secondary | ICD-10-CM

## 2017-07-26 DIAGNOSIS — R0789 Other chest pain: Secondary | ICD-10-CM

## 2017-07-26 DIAGNOSIS — Z9981 Dependence on supplemental oxygen: Secondary | ICD-10-CM

## 2017-07-26 DIAGNOSIS — Z6820 Body mass index (BMI) 20.0-20.9, adult: Secondary | ICD-10-CM

## 2017-07-26 DIAGNOSIS — C787 Secondary malignant neoplasm of liver and intrahepatic bile duct: Secondary | ICD-10-CM

## 2017-07-26 LAB — COMPREHENSIVE METABOLIC PANEL
ALT: 99 U/L — ABNORMAL HIGH (ref 0–44)
ANION GAP: 10 (ref 5–15)
AST: 125 U/L — ABNORMAL HIGH (ref 15–41)
Albumin: 2.4 g/dL — ABNORMAL LOW (ref 3.5–5.0)
Alkaline Phosphatase: 447 U/L — ABNORMAL HIGH (ref 38–126)
BILIRUBIN TOTAL: 0.7 mg/dL (ref 0.3–1.2)
BUN: 22 mg/dL (ref 8–23)
CO2: 26 mmol/L (ref 22–32)
Calcium: 8.5 mg/dL — ABNORMAL LOW (ref 8.9–10.3)
Chloride: 102 mmol/L (ref 98–111)
Creatinine, Ser: 0.59 mg/dL (ref 0.44–1.00)
GFR calc Af Amer: >60 mL/min (ref >60)
GFR calc non Af Amer: >60 mL/min (ref >60)
Glucose, Bld: 97 mg/dL (ref 70–99)
POTASSIUM: 4 mmol/L (ref 3.5–5.1)
Sodium: 138 mmol/L (ref 135–145)
TOTAL PROTEIN: 5.4 g/dL — AB (ref 6.5–8.1)

## 2017-07-26 LAB — PROCALCITONIN: PROCALCITONIN: 2.78 ng/mL

## 2017-07-26 MED ORDER — LORAZEPAM 2 MG/ML IJ SOLN
0.5000 mg | Freq: Once | INTRAMUSCULAR | Status: AC
  Administered 2017-07-26: 0.5 mg via INTRAVENOUS
  Filled 2017-07-26: qty 1

## 2017-07-26 MED ORDER — FENTANYL CITRATE (PF) 100 MCG/2ML IJ SOLN
INTRAMUSCULAR | Status: AC
  Filled 2017-07-26: qty 2

## 2017-07-26 MED ORDER — FLUMAZENIL 0.5 MG/5ML IV SOLN
INTRAVENOUS | Status: AC
  Filled 2017-07-26: qty 5

## 2017-07-26 MED ORDER — SENNOSIDES-DOCUSATE SODIUM 8.6-50 MG PO TABS
1.0000 | ORAL_TABLET | Freq: Two times a day (BID) | ORAL | Status: DC
  Administered 2017-07-26 – 2017-07-27 (×3): 1 via ORAL
  Filled 2017-07-26 (×3): qty 1

## 2017-07-26 MED ORDER — NALOXONE HCL 0.4 MG/ML IJ SOLN
INTRAMUSCULAR | Status: AC
  Filled 2017-07-26: qty 1

## 2017-07-26 MED ORDER — MIDAZOLAM HCL 2 MG/2ML IJ SOLN
INTRAMUSCULAR | Status: AC | PRN
  Administered 2017-07-26: 1 mg via INTRAVENOUS
  Administered 2017-07-26: 0.5 mg via INTRAVENOUS

## 2017-07-26 MED ORDER — GADOBENATE DIMEGLUMINE 529 MG/ML IV SOLN
10.0000 mL | Freq: Once | INTRAVENOUS | Status: AC | PRN
  Administered 2017-07-26: 10 mL via INTRAVENOUS

## 2017-07-26 MED ORDER — MORPHINE SULFATE ER 30 MG PO TBCR
30.0000 mg | EXTENDED_RELEASE_TABLET | Freq: Two times a day (BID) | ORAL | Status: DC
  Administered 2017-07-26 – 2017-07-30 (×8): 30 mg via ORAL
  Filled 2017-07-26 (×10): qty 1

## 2017-07-26 MED ORDER — MORPHINE SULFATE 15 MG PO TABS
15.0000 mg | ORAL_TABLET | ORAL | Status: DC | PRN
  Administered 2017-07-27 – 2017-07-30 (×5): 15 mg via ORAL
  Filled 2017-07-26 (×5): qty 1

## 2017-07-26 MED ORDER — MIDAZOLAM HCL 2 MG/2ML IJ SOLN
INTRAMUSCULAR | Status: AC
  Filled 2017-07-26: qty 2

## 2017-07-26 MED ORDER — FENTANYL CITRATE (PF) 100 MCG/2ML IJ SOLN
INTRAMUSCULAR | Status: AC | PRN
  Administered 2017-07-26: 50 ug via INTRAVENOUS

## 2017-07-26 MED ORDER — LIDOCAINE HCL 1 % IJ SOLN
INTRAMUSCULAR | Status: AC
  Filled 2017-07-26: qty 20

## 2017-07-26 MED ORDER — DEXAMETHASONE 4 MG PO TABS
4.0000 mg | ORAL_TABLET | Freq: Two times a day (BID) | ORAL | Status: DC
  Administered 2017-07-26 – 2017-07-27 (×3): 4 mg via ORAL
  Filled 2017-07-26 (×5): qty 1

## 2017-07-26 NOTE — Procedures (Signed)
Liver mets  S/P US LIVER MASS 18 G CORE BX  No comp Stable Path pending Full report in pacs

## 2017-07-26 NOTE — Consult Note (Signed)
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 s/p liver biopsy, pathology pending Mediastinal lymphadenopathy with occlusion of right lower lobe bronchus causing partial right lower lobe collapse Biopsy is pending The pattern is highly suspicious for possible new primary lung cancer with widespread metastatic disease to the liver I recommend MRI brain to complete staging I recommend radiation oncology consultation for palliative radiation therapy to the chest; radiation oncologist has been consulted I have a brief discussion with the family that we need tissue biopsy results to discuss choices of palliative chemotherapy I explained to them why surgery is not an option with stage IV metastatic disease  Mild intermittent headaches I will order MRI to complete staging Due to anxiety, I will place order for lorazepam to take as needed  Elevated liver enzymes Due to diffuse liver involvement This may exclude certain choices of palliative chemotherapy She does not have signs of liver failure  Chronic respiratory failure due to COPD, oxygen dependent Probable post-obstructive pneumonia I recommend consultation with radiation oncologist to weigh in the benefit of urgent palliative radiation treatment Continue inhalers and oxygen along with antibiotics She follows with pulmonologist I have discontinued prednisone and switch her over to dexamethasone instead  Moderate to severe protein calorie malnutrition Due to cancer Increase oral intake as tolerated Hopefully, the dexamethasone will improve appetite  History of kidney cancer s/p nephrectomy She has adequate renal function I felt that it is unlikely due to recurrent metastatic kidney cancer given the pattern of spread of disease  Severe cancer pain She is placed on MS Contin along with percocet for  breakthrough pain medicine Due to liver enzymes abnormalities, I have discontinue percocet and replaced with immediate release morphine instead She will continue regular pain assessment and will be discharged once her pain is well controlled  Chronic constipation I recommend scheduled Senokot/laxative to avoid severe constipation  Goals of care I cannot go into the fine details of treatment options, side effects or prognosis The patient and family members are aware she had incurable stage IV disease I will return again in 1 to 2 days time once biopsy report is available for another goals of care discussion if she is still hospitalized by then The patient has expressed desire to start palliative chemotherapy if possible  All questions were answered. The patient knows to call the clinic with any problems, questions or concerns.  Heath Lark, MD 07/26/2017 4:54 PM  CHIEF COMPLAINTS/PURPOSE OF CONSULTATION:  Diffuse metastatic cancer, for further management  HISTORY OF PRESENTING ILLNESS:  Wanda James 62 y.o. female is  seen by request by hospitalist to evaluate patient with evidence of diffuse metastatic cancer I have reviewed her chart and materials related to her cancer extensively and collaborated history with the patient. She is an excellent historian She had remote history of left kidney cancer status post nephrectomy 20 years ago.  At the time, she presented with lower back pain.  After nephrectomy, she was told she was cured and did not require any form of adjuvant treatment  The patient is a heavy smoker but has not smoked for over 30 days She was recently hospitalized a month ago and was diagnosed with pneumonia.  She did not improve despite oral antibiotic treatment and was actually discharged home last month on oxygen therapy.  3 days ago, she was admitted through the emergency department after  presentation with retrosternal chest pain, shortness of breath, wheezing with  associated cough.  She denies recent hemoptysis.  In the emergency department, she underwent CT imaging of the chest which showed significant findings including partial right lower lobe collapse with presence of a right infrahilar mass and mediastinal lymphadenopathy.  CT scan of the abdomen showed suspicious lesions in her liver.  MRI of the liver quantify nearly 100 liver metastases. She underwent liver biopsy today, results pending  She has been complaining of right upper quadrant pain retrosternal chest pain and occasional back pain for long time She has lost over 15 pounds of weight due to pain and anorexia She has history of chronic constipation.  She denies changes in bowel habits.  She had history of colectomy due to diverticular bleed. She has been complaining of intermittent headaches.  Since admission to the hospital, her pain has slightly improved with aggressive pain management.  However, she is still dependent on intermittent doses of intravenous pain medicine and hence was not able to be discharged home yet. She had a remote history of stroke but denies chronic neurological deficit  MEDICAL HISTORY:  Past Medical History:  Diagnosis Date  . Acid reflux   . Arthritis   . Cancer ([facility])    kidney  . Cataract   . Chiari malformation type I ([place])   . Complication of anesthesia    Hard To Awaken  . COPD (chronic obstructive pulmonary disease) ([place])   . Diverticulitis   . Headache   . Pneumonia   . Stroke ([place])   . Subclavian artery stenosis, left ([facility])    s/p angioplasty/stent 04/25/08    SURGICAL HISTORY: Past Surgical History:  Procedure Laterality Date  . ABDOMINAL HYSTERECTOMY     partial  . BREAST SURGERY     left lumpectomy  . carotidendarterectomy    . COLON SURGERY    . EYE SURGERY    . INNER EAR SURGERY     right  . LUMBAR LAMINECTOMY/DECOMPRESSION MICRODISCECTOMY N/A 08/19/2016   Procedure: LUMBAR 4-5 DECOMPRESSION;  Surgeon: Phylliss Bob, MD;   Location: [facility];  Service: Orthopedics;  Laterality: N/A;  LUMBAR 4-5 DECOMPRESSION REQUESTED TIME 2.5 HRS   . NEPHRECTOMY     left  . PARTIAL COLECTOMY    . RHINOPLASTY    . TONSILLECTOMY    . TUBAL LIGATION      SOCIAL HISTORY: Social History   Socioeconomic History  . Marital status: Married    Spouse name: Not on file  . Number of children: Not on file  . Years of education: Not on file  . Highest education level: Not on file  Occupational History  . Not on file  Social Needs  . Financial resource strain: Not on file  . Food insecurity:    Worry: Not on file    Inability: Not on file  . Transportation needs:    Medical: Not on file    Non-medical: Not on file  Tobacco Use  . Smoking status: Current Every Day Smoker    Packs/day: 1.00    Types: Cigarettes  . Smokeless tobacco: Never Used  Substance and Sexual Activity  . Alcohol use: No  . Drug use: No  . Sexual activity: Not on file  Lifestyle  . Physical activity:    Days per week: Not on file    Minutes per session: Not on file  . Stress: Not on file  Relationships  . Social connections:    Talks on  phone: Not on file    Gets together: Not on file    Attends religious service: Not on file    Active member of club or organization: Not on file    Attends meetings of clubs or organizations: Not on file    Relationship status: Not on file  . Intimate partner violence:    Fear of current or ex partner: Not on file    Emotionally abused: Not on file    Physically abused: Not on file    Forced sexual activity: Not on file  Other Topics Concern  . Not on file  Social History Narrative  . Not on file    FAMILY HISTORY: Family History  Problem Relation Age of Onset  . Lung cancer Father     ALLERGIES:  is allergic to iohexol; sodium hypochlorite; penicillins; hydrocodone; and tape.  MEDICATIONS:  Current Facility-Administered Medications  Medication Dose Route Frequency Provider Last Rate Last Dose   . albuterol (PROVENTIL) (2.5 MG/3ML) 0.083% nebulizer solution 2.5 mg  2.5 mg Nebulization Q6H PRN Jani Gravel, MD   2.5 mg at 07/08/2017 2339  . albuterol (PROVENTIL) (2.5 MG/3ML) 0.083% nebulizer solution 2.5 mg  2.5 mg Nebulization TID Jani Gravel, MD   2.5 mg at 07/26/17 1400  . aspirin EC tablet 325 mg  325 mg Oral Daily Jani Gravel, MD   325 mg at 07/26/17 1020  . ceFEPIme (MAXIPIME) 1 g in sodium chloride 0.9 % 100 mL IVPB  1 g Intravenous Q8H Jani Gravel, MD   Stopped at 07/26/17 1441  . enoxaparin (LOVENOX) injection 40 mg  40 mg Subcutaneous Q24H Brynda Greathouse Sue-Ellen, PA      . feeding supplement (PRO-STAT SUGAR FREE 64) liquid 30 mL  30 mL Oral BID Jani Gravel, MD   30 mL at 07/25/17 0934  . fentaNYL (SUBLIMAZE) 100 MCG/2ML injection           . guaiFENesin (MUCINEX) 12 hr tablet 600 mg  600 mg Oral BID Jani Gravel, MD   600 mg at 07/26/17 1020  . HYDROmorphone (DILAUDID) injection 0.5 mg  0.5 mg Intravenous Q4H PRN Lavina Hamman, MD   0.5 mg at 07/26/17 0736  . lidocaine (XYLOCAINE) 1 % (with pres) injection           . lipase/protease/amylase (CREON) capsule 12,000 Units  12,000 Units Oral TID Carmelia Roller, MD   12,000 Units at 07/25/17 1627  . LORazepam (ATIVAN) tablet 0.5 mg  0.5 mg Oral BID PRN Lavina Hamman, MD   0.5 mg at 07/25/17 2300  . MEDLINE mouth rinse  15 mL Mouth Rinse BID Jani Gravel, MD   15 mL at 07/26/17 1020  . midazolam (VERSED) 2 MG/2ML injection           . mometasone-formoterol (DULERA) 100-5 MCG/ACT inhaler 2 puff  2 puff Inhalation BID Jani Gravel, MD   2 puff at 07/26/17 0801  . morphine (MS CONTIN) 12 hr tablet 30 mg  30 mg Oral Q12H Lavina Hamman, MD   30 mg at 07/26/17 1212  . nicotine (NICODERM CQ - dosed in mg/24 hours) patch 21 mg  21 mg Transdermal Daily Jani Gravel, MD   21 mg at 07/26/17 1023  . ondansetron (ZOFRAN) injection 4 mg  4 mg Intravenous Q6H PRN Jani Gravel, MD   4 mg at 07/11/2017 2353  . oxyCODONE-acetaminophen (PERCOCET/ROXICET) 5-325 MG  per tablet 1 tablet  1 tablet Oral Q4H PRN Lavina Hamman,  MD   1 tablet at 07/26/17 1411  . predniSONE (DELTASONE) tablet 40 mg  40 mg Oral Q breakfast Lavina Hamman, MD   40 mg at 07/26/17 0737  . promethazine (PHENERGAN) injection 12.5 mg  12.5 mg Intravenous Q6H PRN Jani Gravel, MD   12.5 mg at 07/24/17 0154  . tiotropium (SPIRIVA) inhalation capsule 18 mcg  18 mcg Inhalation Daily Jani Gravel, MD   18 mcg at 07/26/17 0802  . zolpidem (AMBIEN) tablet 5 mg  5 mg Oral QHS PRN Lavina Hamman, MD        REVIEW OF SYSTEMS:   Constitutional: Denies fevers, chills or abnormal night sweats Eyes: Denies blurriness of vision, double vision or watery eyes Ears, nose, mouth, throat, and face: Denies mucositis or sore throat Cardiovascular: Denies palpitation, chest discomfort or lower extremity swelling Skin: Denies abnormal skin rashes Lymphatics: Denies new lymphadenopathy or easy bruising Neurological:Denies numbness, tingling or new weaknesses Behavioral/Psych: Mood is stable, no new changes  All other systems were reviewed with the patient and are negative.  PHYSICAL EXAMINATION: ECOG PERFORMANCE STATUS: 2 - Symptomatic, <50% confined to bed  Vitals:   07/26/17 1625 07/26/17 1645  BP: 137/86 129/88  Pulse: 89 88  Resp: 16 16  Temp: 97.9 F (36.6 C) 98 F (36.7 C)  SpO2: 98% 95%   Filed Weights   07/09/2017 1949 07/24/17 0055  Weight: 113 lb (51.3 kg) 111 lb 12.4 oz (50.7 kg)    GENERAL:alert, no distress and comfortable.  She looks thin and mildly cachectic.  She has oxygen delivered via nasal cannula SKIN: skin color, texture, turgor are normal, no rashes or significant lesions EYES: normal, conjunctiva are pink and non-injected, sclera clear OROPHARYNX:no exudate, no erythema and lips, buccal mucosa, and tongue normal  NECK: supple, thyroid normal size, non-tender, without nodularity LYMPH:  no palpable lymphadenopathy in the cervical, axillary or inguinal LUNGS: clear to  auscultation and percussion with normal breathing effort HEART: regular rate & rhythm and no murmurs and no lower extremity edema ABDOMEN:abdomen soft, mild discomfort on palpation on the right upper quadrant.   Musculoskeletal:no cyanosis of digits and no clubbing  PSYCH: alert & oriented x 3 with fluent speech NEURO: no focal motor/sensory deficits  LABORATORY DATA:  I have reviewed the data as listed Lab Results  Component Value Date   WBC 8.6 07/25/2017   HGB 12.2 07/25/2017   HCT 37.8 07/25/2017   MCV 89.8 07/25/2017   PLT 280 07/25/2017   Recent Labs    07/24/17 0443 07/25/17 0445 07/26/17 0446  NA 138 138 138  K 4.4 4.0 4.0  CL 104 104 102  CO2 25 24 26   GLUCOSE 143* 129* 97  BUN 11 14 22   CREATININE 0.74 0.64 0.59  CALCIUM 8.6* 8.5* 8.5*  GFRNONAA >60 >60 >60  GFRAA >60 >60 >60  PROT 6.0* 5.7* 5.4*  ALBUMIN 2.6* 2.5* 2.4*  AST 168* 132* 125*  ALT 114* 105* 99*  ALKPHOS 479* 417* 447*  BILITOT 0.8 0.7 0.7    RADIOGRAPHIC STUDIES: I have personally reviewed the radiological images as listed and agreed with the findings in the report. Ct Abdomen Pelvis Wo Contrast  Result Date: 07/14/2017 CLINICAL DATA:  Mid chest and back pain. Patient was recently treated for pneumonia and has L4 and L5 fractures coughing. EXAM: CT CHEST, ABDOMEN AND PELVIS WITHOUT CONTRAST TECHNIQUE: Multidetector CT imaging of the chest, abdomen and pelvis was performed following the standard protocol without IV contrast. COMPARISON:  CXR from the same day, 06/29/2017 CXR and 01/31/2015 CT AP FINDINGS: CT CHEST FINDINGS Cardiovascular: Conventional branch pattern of the great vessels with atherosclerosis of the proximal great vessels. Mild atherosclerosis of the nonaneurysmal thoracic aorta measuring up to 3.3 cm along the ascending portion. Nondilated main pulmonary artery. Heart size is normal without pericardial effusion. Minimal coronary arteriosclerosis along the distal LAD.  Mediastinum/Nodes: Mediastinal adenopathy is identified with largest lymph node right lower paratracheal measuring 2.2 cm in short axis. A 1.1 cm AP window lymph node is also noted. The trachea and mainstem bronchi are patent. Occlusion of the right lower lobe bronchus and branch vessels is noted. Lungs/Pleura: There is a confluent area of partially atelectatic/partially collapsed right lower lobe with displacement of the major fissure and inferior displacement of the right hilum from volume loss. This is likely related to obstruction of right lower lobe pulmonary bronchi possibly from mucus inspissation given somewhat tubular hypodensity new noted within the visualized bronchi. Alternatively an endobronchial lesion is not excluded or unlikely an extrinsic infrahilar mass causing extrinsic impression given the subtle tubular soft tissue opacities noted within the expected bronchi. Musculoskeletal: Superior endplate Schmorl's node of T10. No aggressive osseous lesions. CT ABDOMEN PELVIS FINDINGS Hepatobiliary: Inhomogeneous masslike hypodensities are noted of the liver concerning for liver lesions possibly metastatic given findings in the chest. No biliary dilatation is identified. The gallbladder is unremarkable. Pancreas: Normal Spleen: Normal size spleen without focal mass. Adrenals/Urinary Tract: Status post left nephrectomy. Right adrenal gland and kidney are nonacute with compensatory hypertrophy of the right kidney. Punctate renal pelvic calcifications seen on the right possibly vascular or a nonobstructing calculus. Left adrenal gland is not well visualized and may be surgically absent as well. Nodularity in the expected location of the adrenal gland is felt to represent partial volume averaging of the tail of the pancreas. Stomach/Bowel: The stomach is decompressed in appearance. Normal small bowel rotation without obstruction or inflammation. The distal and terminal ileum are normal. Normal appearing  appendix is noted. Increased stool burden within the colon compatible constipation. No large bowel inflammation. Partial colectomy on the left with sutures noted in the region of the rectosigmoid. Vascular/Lymphatic: Moderate to marked atherosclerosis of the abdominal aorta and branch vessels. Small retroperitoneal lymph nodes are identified without pathologic enlargement. Reproductive: Hysterectomy.  No adnexal mass. Other: No free air nor free fluid. Musculoskeletal: Degenerative disc disease L1 through L4. No aggressive osseous lesions identified. IMPRESSION: 1. Partial right lower lobe collapse/atelectasis with displacement of the right major fissure and caudal displacement of the right hilum. Differential considerations for this finding may be secondary to mucous impaction. A right infrahilar mass or endobronchial lesion causing the atelectasis are not entirely excluded. Mediastinal lymphadenopathy is also noted concerning for possible metastatic disease though reactive lymph nodes might also account for this. 2. Subtle hypodensity masslike abnormalities of the unenhanced liver worrisome for possible metastatic disease in light of the pulmonary findings. CT or MRI with IV contrast would be preferable for further assessment. 3. Left nephrectomy. Compensatory hypertrophy of the right kidney with nonobstructing interpolar calculus possibly vascular. 4. Partial left colectomy. 5. Moderate atherosclerosis of the abdominal aorta and branch vessels. Electronically Signed   By: Ashley Royalty M.D.   On: 07/05/2017 21:33   Dg Chest 2 View  Result Date: 07/21/2017 CLINICAL DATA:  Chest pain EXAM: CHEST - 2 VIEW COMPARISON:  June 29, 2017 FINDINGS: There is extensive right lower lobe airspace consolidation consistent with pneumonia. Lungs elsewhere clear. Heart size  and pulmonary vascularity are normal. No adenopathy. There is an old healed fracture of the lateral right sixth rib. There is aortic atherosclerosis.  IMPRESSION: Extensive consolidation right lower lobe consistent with pneumonia. Lungs elsewhere clear. Heart size normal. There is aortic atherosclerosis. Aortic Atherosclerosis (ICD10-I70.0). Electronically Signed   By: Lowella Grip III M.D.   On: 07/07/2017 20:57   Ct Chest Wo Contrast  Result Date: 07/17/2017 CLINICAL DATA:  Mid chest and back pain. Patient was recently treated for pneumonia and has L4 and L5 fractures coughing. EXAM: CT CHEST, ABDOMEN AND PELVIS WITHOUT CONTRAST TECHNIQUE: Multidetector CT imaging of the chest, abdomen and pelvis was performed following the standard protocol without IV contrast. COMPARISON:  CXR from the same day, 06/29/2017 CXR and 01/31/2015 CT AP FINDINGS: CT CHEST FINDINGS Cardiovascular: Conventional branch pattern of the great vessels with atherosclerosis of the proximal great vessels. Mild atherosclerosis of the nonaneurysmal thoracic aorta measuring up to 3.3 cm along the ascending portion. Nondilated main pulmonary artery. Heart size is normal without pericardial effusion. Minimal coronary arteriosclerosis along the distal LAD. Mediastinum/Nodes: Mediastinal adenopathy is identified with largest lymph node right lower paratracheal measuring 2.2 cm in short axis. A 1.1 cm AP window lymph node is also noted. The trachea and mainstem bronchi are patent. Occlusion of the right lower lobe bronchus and branch vessels is noted. Lungs/Pleura: There is a confluent area of partially atelectatic/partially collapsed right lower lobe with displacement of the major fissure and inferior displacement of the right hilum from volume loss. This is likely related to obstruction of right lower lobe pulmonary bronchi possibly from mucus inspissation given somewhat tubular hypodensity new noted within the visualized bronchi. Alternatively an endobronchial lesion is not excluded or unlikely an extrinsic infrahilar mass causing extrinsic impression given the subtle tubular soft tissue  opacities noted within the expected bronchi. Musculoskeletal: Superior endplate Schmorl's node of T10. No aggressive osseous lesions. CT ABDOMEN PELVIS FINDINGS Hepatobiliary: Inhomogeneous masslike hypodensities are noted of the liver concerning for liver lesions possibly metastatic given findings in the chest. No biliary dilatation is identified. The gallbladder is unremarkable. Pancreas: Normal Spleen: Normal size spleen without focal mass. Adrenals/Urinary Tract: Status post left nephrectomy. Right adrenal gland and kidney are nonacute with compensatory hypertrophy of the right kidney. Punctate renal pelvic calcifications seen on the right possibly vascular or a nonobstructing calculus. Left adrenal gland is not well visualized and may be surgically absent as well. Nodularity in the expected location of the adrenal gland is felt to represent partial volume averaging of the tail of the pancreas. Stomach/Bowel: The stomach is decompressed in appearance. Normal small bowel rotation without obstruction or inflammation. The distal and terminal ileum are normal. Normal appearing appendix is noted. Increased stool burden within the colon compatible constipation. No large bowel inflammation. Partial colectomy on the left with sutures noted in the region of the rectosigmoid. Vascular/Lymphatic: Moderate to marked atherosclerosis of the abdominal aorta and branch vessels. Small retroperitoneal lymph nodes are identified without pathologic enlargement. Reproductive: Hysterectomy.  No adnexal mass. Other: No free air nor free fluid. Musculoskeletal: Degenerative disc disease L1 through L4. No aggressive osseous lesions identified. IMPRESSION: 1. Partial right lower lobe collapse/atelectasis with displacement of the right major fissure and caudal displacement of the right hilum. Differential considerations for this finding may be secondary to mucous impaction. A right infrahilar mass or endobronchial lesion causing the  atelectasis are not entirely excluded. Mediastinal lymphadenopathy is also noted concerning for possible metastatic disease though reactive lymph nodes might  also account for this. 2. Subtle hypodensity masslike abnormalities of the unenhanced liver worrisome for possible metastatic disease in light of the pulmonary findings. CT or MRI with IV contrast would be preferable for further assessment. 3. Left nephrectomy. Compensatory hypertrophy of the right kidney with nonobstructing interpolar calculus possibly vascular. 4. Partial left colectomy. 5. Moderate atherosclerosis of the abdominal aorta and branch vessels. Electronically Signed   By: Ashley Royalty M.D.   On: 07/22/2017 21:33   Mr Abdomen W Wo Contrast  Result Date: 07/24/2017 CLINICAL DATA:  LEFT lower lobe collapse difficulty breathing. Indeterminate hepatic lesions identified on noncontrast CT. History of renal cell carcinoma. LEFT nephrectomy EXAM: MRI ABDOMEN WITHOUT AND WITH CONTRAST TECHNIQUE: Multiplanar multisequence MR imaging of the abdomen was performed both before and after the administration of intravenous contrast. CONTRAST:  58mL MULTIHANCE GADOBENATE DIMEGLUMINE 529 MG/ML IV SOLN COMPARISON:  CT 07/31/2017 FINDINGS: Lower chest: A complete collapse of the RIGHT lower lobe with obstruction of the RIGHT lower lobe bronchus. On coronal imaging there is masslike lesion surrounding the bronchus intermedius measuring 6.4 by 4.1 cm (image 16/6 per Hepatobiliary: Too numerous to count round enhancing lesions extensively involving the LEFT RIGHT hepatic lobe. Lesions range in size from 10 mm to 33 mm. Approximately 100 lesions. Largest lesion occupies the LEFT hepatic lobe measures 6.4 cm large example lesion in the RIGHT hepatic lobe measures 3.6 cm in the dome (image 27/9 0 point. There is no biliary duct dilatation. The gallbladder and common bile duct are normal. Pancreas: Pancreas is normal. No ductal dilatation. No pancreatic inflammation.  Spleen: Normal spleen Adrenals/urinary tract: Adrenal glands are normal. Post LEFT nephrectomy. Compensatory hypertrophy of the RIGHT kidney. No RIGHT renal lesion. Stomach/Bowel: Stomach and limited view of the bowel is unremarkable. Vascular/Lymphatic: Normal abdominal aorta. No significant adenopathy Reproductive: Other: No free fluid. Musculoskeletal: No aggressive osseous lesion. IMPRESSION: 1. Large RIGHT hilar mass surrounds the bronchus intermedius and extends into the mediastinum consistent with primary malignancy versus metastatic lesion. This lesion obstructs the RIGHT lower bronchus with complete atelectasis of the RIGHT lower lobe. 2. 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 Lesions reached near confluence and and number approximately 100. 3. LEFT nephrectomy.  Normal hypertrophy RIGHT kidney. 4. No bony metastasis identified Electronically Signed   By: Suzy Bouchard M.D.   On: 07/24/2017 15:28   US Biopsy (liver)  Result Date: 07/26/2017 INDICATION: Large right hilar mass, innumerable liver metastases EXAM: ULTRASOUND GUIDED CORE BIOPSY OF RIGHT LIVER LESION MEDICATIONS: 1% lidocaine local ANESTHESIA/SEDATION: Versed 1.5mg  IV; Fentanyl 21mcg IV; Moderate Sedation Time:  10 minutes The patient was continuously monitored during the procedure by the interventional radiology nurse under my direct supervision. FLUOROSCOPY TIME:  Fluoroscopy Time: None. COMPLICATIONS: None immediate. PROCEDURE: The procedure, risks, benefits, and alternatives were explained to the patient. Questions regarding the procedure were encouraged and answered. The patient understands and consents to the procedure. Previous imaging reviewed. Preliminary ultrasound performed. A right hepatic lesion was demonstrated along the right subcostal margin adjacent to the gallbladder. Overlying skin marked. Under sterile conditions and local anesthesia, a 17 gauge 6.8 cm access needle was advanced to the lesion. Needle  position confirmed with ultrasound. 18 gauge core biopsies obtained. Images obtained for documentation. No immediate complication. Patient tolerated the biopsy well. FINDINGS: Imaging confirms needle placed in the right hepatic lesion for core biopsy. IMPRESSION: Successful ultrasound right hepatic mass 18 gauge core biopsy Electronically Signed   By: Jerilynn Mages.  Shick M.D.   On: 07/26/2017  16:34   Dg Chest Port 1 View  Result Date: 06/29/2017 CLINICAL DATA:  Short of breath EXAM: PORTABLE CHEST 1 VIEW COMPARISON:  06/28/2017 FINDINGS: Normal heart size. Hyperaeration. Stable airspace disease at the right base. Persistent volume loss in the right lung with shift of the mediastinum to the right. No pneumothorax. No definite pleural effusion. IMPRESSION: Stable airspace disease at the right lung base with volume loss. Electronically Signed   By: Marybelle Killings M.D.   On: 06/29/2017 06:59   Dg Chest Port 1 View  Result Date: 06/28/2017 CLINICAL DATA:  Shortness of breath EXAM: PORTABLE CHEST 1 VIEW COMPARISON:  06/26/2017 FINDINGS: Cardiac shadow is stable. The lungs are well aerated bilaterally with persistent right basilar infiltrate. No focal effusion is seen. No bony abnormality is noted. IMPRESSION: Persistent right basilar infiltrate.  The left lung remains clear. Electronically Signed   By: Inez Catalina M.D.   On: 06/28/2017 07:52

## 2017-07-26 NOTE — Progress Notes (Signed)
[facility] Progress Note  Patient: Wanda James DTO:[id number]   PCP: Gaye Alken, PA-C DOB: [birth year]   DOA: 07/29/2017   DOS: 07/26/2017   Date of Service: the patient was seen and examined on 07/26/2017  Subjective: Pain is still present.  No nausea no vomiting.  Patient yesterday primarily used her IV Dilaudid as her pain was severe most of the time.  No nausea no vomiting.  No fever no chills.  Brief hospital course: Pt. with PMH of COPD, arthritis, renal cell carcinoma, S/P nephrectomy; admitted on 07/28/2017, presented with complaint of cough and shortness of breath, was found to have lung mass with liver metastasis, postobstructive pneumonia.  [facility] consulted who felt that IR guided biopsy is a better option.  IR consulted.  Biopsy scheduled for tomorrow. Currently further plan is continue pain control.  Assessment and Plan: 1.  Right hilar mass. Liver metastasis. Presents with cough and shortness of breath and chest pain. Suspected to have pneumonia with a work-up was suggestive of for large right lung mass. Mediastinal and liver metastasis is suspected as well. Pulmonary consulted and felt that the patient should get liver biopsy to help with staging as well. IR consulted and the biopsy is scheduled for 07/26/2017. If the biopsy is negative patient will require bronchoscopy and biopsy as well. Currently no significant wheezing appreciated. Oncology consulted.  Since the findings are highly suggestive of a cancer and patient may benefit from radiation radiation oncology is consulted.  2.  Postobstructive pneumonia. Suspected healthcare associated pneumonia. CT chest also suggested a possible postobstructive pneumonia versus atelectasis. Pulmonary consulted as well. Patient was on IV Solu-Medrol although I do not suspect that the patient does have any active COPD exacerbation and therefore will transition to oral prednisone. MRSA PCR is negative therefore will  discontinue vancomycin. Blood cultures so far negative as well. Continue cefepime for now and will likely transition her to oral Augmentin for the same. Monitor other recommendation from pulmonary. History of COPD continue home inhalers.  3.  Chest pain. Anxiety. Chronic low back pain. Patient is on tramadol for pain control which is not adequate and therefore I will increase her pain regimen to Percocet. Continue Dilaudid for now. Patient was informed that I would prefer to use oral narcotics or IV to monitor her response to this medication so that further adjustment can be made. We will place her on scheduled MS Contin 30 mg twice daily. Currently significant anxious and in distress, PRN Ativan ordered. If continues to have significant anxiety and difficult control pain and will consult palliative care for input. Currently do not have a diagnosis and therefore do not have a plan of care regarding her cancer and therefore will hold off on palliative care consultation for goals of care.  4.  History of renal cell carcinoma S/P left renal nephrectomy. Avoid nephrotoxic agent. Monitor.  5 active smoker. More than 40 years of history. Nicotine patch.  6.  Type 2 diabetes mellitus. Continue sliding scale insulin.  Diet: carb modified diet DVT Prophylaxis: subcutaneous Heparin  Advance goals of care discussion: full code  Family Communication: family was present at bedside, at the time of interview. The pt provided permission to discuss medical plan with the family. Opportunity was given to ask question and all questions were answered satisfactorily.   Disposition:  Discharge to be determined.  Consultants: PCCM, IR, oncology Procedures: Liver biopsy  Antibiotics: Anti-infectives (From admission, onward)   Start     Dose/Rate Route Frequency Ordered  Stop   07/24/17 2200  vancomycin (VANCOCIN) IVPB 750 mg/150 ml premix  Status:  Discontinued     750 mg 150 mL/hr over 60  Minutes Intravenous Every 24 hours 07/10/2017 2334 07/25/17 1327   07/24/17 0600  ceFEPIme (MAXIPIME) 1 g in sodium chloride 0.9 % 100 mL IVPB     1 g 200 mL/hr over 30 Minutes Intravenous Every 8 hours 07/05/2017 2322 08/01/17 0559   07/30/2017 2145  vancomycin (VANCOCIN) IVPB 1000 mg/200 mL premix     1,000 mg 200 mL/hr over 60 Minutes Intravenous  Once 07/29/2017 2143 07/10/2017 2353   07/30/2017 2145  ceFEPIme (MAXIPIME) 2 g in sodium chloride 0.9 % 100 mL IVPB     2 g 200 mL/hr over 30 Minutes Intravenous  Once 08/01/2017 2143 07/07/2017 2230       Objective: Physical Exam: Vitals:   07/26/17 1625 07/26/17 1645 07/26/17 1717 07/26/17 1755  BP: 137/86 129/88 136/86 104/67  Pulse: 89 88 97 89  Resp: 16 16 15    Temp: 97.9 F (36.6 C) 98 F (36.7 C)  98.2 F (36.8 C)  TempSrc: Oral Oral Oral Oral  SpO2: 98% 95% 96% 96%  Weight:      Height:        Intake/Output Summary (Last 24 hours) at 07/26/2017 1808 Last data filed at 07/26/2017 1700 Gross per 24 hour  Intake 810 ml  Output -  Net 810 ml   Filed Weights   07/21/2017 1949 07/24/17 0055  Weight: 51.3 kg (113 lb) 50.7 kg (111 lb 12.4 oz)   General: Alert, Awake and Oriented to Time, Place and Person. Appear in marked distress, affect labile Eyes: PERRL, Conjunctiva normal ENT: Oral Mucosa clear moist. Neck: no JVD, no Abnormal Mass Or lumps Cardiovascular: S1 and S2 Present, no Murmur, Peripheral Pulses Present Respiratory: normal respiratory effort, Bilateral Air entry equal and Decreased, no use of accessory muscle, basal Crackles, no wheezes Abdomen: Bowel Sound present, Soft and mild tenderness, no hernia Skin: no redness, no Rash, no induration Extremities: no Pedal edema, no calf tenderness Neurologic: Grossly no focal neuro deficit. Bilaterally Equal motor strength  Data Reviewed: CBC: Recent Labs  Lab 07/12/2017 2025 07/28/2017 2026 07/24/17 0443 07/25/17 0445  WBC 6.8  --  5.9 8.6  NEUTROABS 5.0  --   --   --   HGB  14.5 15.6* 13.2 12.2  HCT 43.7 46.0 40.8 37.8  MCV 88.6  --  90.1 89.8  PLT 304  --  254 295   Basic Metabolic Panel: Recent Labs  Lab 07/26/2017 2025 07/25/2017 2026 07/24/17 0443 07/25/17 0445 07/26/17 0446  NA 138 136 138 138 138  K 3.7 3.7 4.4 4.0 4.0  CL 99 99 104 104 102  CO2 26  --  25 24 26   GLUCOSE 118* 116* 143* 129* 97  BUN 11 9 11 14 22   CREATININE 0.81 0.80 0.74 0.64 0.59  CALCIUM 9.1  --  8.6* 8.5* 8.5*  MG  --   --   --  1.8  --     Liver Function Tests: Recent Labs  Lab 07/18/2017 2025 07/24/17 0443 07/25/17 0445 07/26/17 0446  AST 193* 168* 132* 125*  ALT 129* 114* 105* 99*  ALKPHOS 518* 479* 417* 447*  BILITOT 0.3 0.8 0.7 0.7  PROT 7.0 6.0* 5.7* 5.4*  ALBUMIN 2.9* 2.6* 2.5* 2.4*   Recent Labs  Lab 07/21/2017 2025  LIPASE 99*   No results for input(s): AMMONIA in the  last 168 hours. Coagulation Profile: Recent Labs  Lab 07/25/17 1124  INR 1.23   Cardiac Enzymes: Recent Labs  Lab 07/22/2017 2025 07/04/2017 2359 07/24/17 0443 07/24/17 1116  TROPONINI <0.03 <0.03 <0.03 <0.03   BNP (last 3 results) No results for input(s): PROBNP in the last 8760 hours. CBG: Recent Labs  Lab 07/25/17 2233  GLUCAP 134*   Studies: US Biopsy (liver)  Result Date: 07/26/2017 INDICATION: Large right hilar mass, innumerable liver metastases EXAM: ULTRASOUND GUIDED CORE BIOPSY OF RIGHT LIVER LESION MEDICATIONS: 1% lidocaine local ANESTHESIA/SEDATION: Versed 1.5mg  IV; Fentanyl 56mcg IV; Moderate Sedation Time:  10 minutes The patient was continuously monitored during the procedure by the interventional radiology nurse under my direct supervision. FLUOROSCOPY TIME:  Fluoroscopy Time: None. COMPLICATIONS: None immediate. PROCEDURE: The procedure, risks, benefits, and alternatives were explained to the patient. Questions regarding the procedure were encouraged and answered. The patient understands and consents to the procedure. Previous imaging reviewed. Preliminary  ultrasound performed. A right hepatic lesion was demonstrated along the right subcostal margin adjacent to the gallbladder. Overlying skin marked. Under sterile conditions and local anesthesia, a 17 gauge 6.8 cm access needle was advanced to the lesion. Needle position confirmed with ultrasound. 18 gauge core biopsies obtained. Images obtained for documentation. No immediate complication. Patient tolerated the biopsy well. FINDINGS: Imaging confirms needle placed in the right hepatic lesion for core biopsy. IMPRESSION: Successful ultrasound right hepatic mass 18 gauge core biopsy Electronically Signed   By: Jerilynn Mages.  Shick M.D.   On: 07/26/2017 16:34    Scheduled Meds: . albuterol  2.5 mg Nebulization TID  . aspirin EC  325 mg Oral Daily  . dexamethasone  4 mg Oral Q12H  . enoxaparin (LOVENOX) injection  40 mg Subcutaneous Q24H  . feeding supplement (PRO-STAT SUGAR FREE 64)  30 mL Oral BID  . fentaNYL      . guaiFENesin  600 mg Oral BID  . lidocaine      . lipase/protease/amylase  12,000 Units Oral TID AC  . mouth rinse  15 mL Mouth Rinse BID  . midazolam      . mometasone-formoterol  2 puff Inhalation BID  . morphine  30 mg Oral Q12H  . nicotine  21 mg Transdermal Daily  . senna-docusate  1 tablet Oral BID  . tiotropium  18 mcg Inhalation Daily   Continuous Infusions: . ceFEPime (MAXIPIME) IV Stopped (07/26/17 1441)   PRN Meds: albuterol, HYDROmorphone (DILAUDID) injection, LORazepam, morphine, ondansetron (ZOFRAN) IV, promethazine, zolpidem  Time spent: 35 minutes  Author: Berle Mull, MD [facility] Pager: [phone] 07/26/2017 6:08 PM  If 7PM-7AM, please contact night-coverage at [email or web address], password [facility]

## 2017-07-26 NOTE — Progress Notes (Signed)
Patient arrived on unit from IR.

## 2017-07-27 ENCOUNTER — Ambulatory Visit
Admit: 2017-07-27 | Discharge: 2017-07-27 | Disposition: A | Discharge status: Home / Self Care | Payer: BLUE CROSS/BLUE SHIELD | Attending: Radiation Oncology | Admitting: Radiation Oncology

## 2017-07-27 DIAGNOSIS — C3490 Malignant neoplasm of unspecified part of unspecified bronchus or lung: Secondary | ICD-10-CM | POA: Insufficient documentation

## 2017-07-27 DIAGNOSIS — C3431 Malignant neoplasm of lower lobe, right bronchus or lung: Secondary | ICD-10-CM

## 2017-07-27 LAB — CBC WITH DIFFERENTIAL/PLATELET
Basophils Absolute: 0 10*3/uL (ref 0.0–0.1)
Basophils Relative: 0 %
EOS ABS: 0 10*3/uL (ref 0.0–0.7)
EOS PCT: 0 %
HCT: 38.1 % (ref 36.0–46.0)
HEMOGLOBIN: 12.1 g/dL (ref 12.0–15.0)
LYMPHS ABS: 0.6 10*3/uL — AB (ref 0.7–4.0)
Lymphocytes Relative: 8 %
MCH: 28.8 pg (ref 26.0–34.0)
MCHC: 31.8 g/dL (ref 30.0–36.0)
MCV: 90.7 fL (ref 78.0–100.0)
Monocytes Absolute: 0 10*3/uL — ABNORMAL LOW (ref 0.1–1.0)
Monocytes Relative: 1 %
NEUTROS PCT: 91 %
Neutro Abs: 7 10*3/uL (ref 1.7–7.7)
PLATELETS: 290 10*3/uL (ref 150–400)
RBC: 4.2 MIL/uL (ref 3.87–5.11)
RDW: 16 % — ABNORMAL HIGH (ref 11.5–15.5)
WBC: 7.6 10*3/uL (ref 4.0–10.5)

## 2017-07-27 LAB — COMPREHENSIVE METABOLIC PANEL
ALK PHOS: 470 U/L — AB (ref 38–126)
ALT: 94 U/L — AB (ref 0–44)
AST: 118 U/L — AB (ref 15–41)
Albumin: 2.4 g/dL — ABNORMAL LOW (ref 3.5–5.0)
Anion gap: 7 (ref 5–15)
BILIRUBIN TOTAL: 0.8 mg/dL (ref 0.3–1.2)
BUN: 23 mg/dL (ref 8–23)
CALCIUM: 8.5 mg/dL — AB (ref 8.9–10.3)
CHLORIDE: 103 mmol/L (ref 98–111)
CO2: 29 mmol/L (ref 22–32)
CREATININE: 0.54 mg/dL (ref 0.44–1.00)
Glucose, Bld: 101 mg/dL — ABNORMAL HIGH (ref 70–99)
Potassium: 4.7 mmol/L (ref 3.5–5.1)
Sodium: 139 mmol/L (ref 135–145)
TOTAL PROTEIN: 5.6 g/dL — AB (ref 6.5–8.1)

## 2017-07-27 LAB — MAGNESIUM: MAGNESIUM: 2.3 mg/dL (ref 1.7–2.4)

## 2017-07-27 MED ORDER — PROSIGHT PO TABS
1.0000 | ORAL_TABLET | Freq: Every day | ORAL | Status: DC
  Administered 2017-07-27 – 2017-08-04 (×7): 1 via ORAL
  Filled 2017-07-27 (×10): qty 1

## 2017-07-27 NOTE — Progress Notes (Signed)
  Radiation Oncology         [phone] ________________________________  Name: Wanda James MRN: [id number]  Date: 07/27/2017  DOB: 1955-09-21  SIMULATION AND TREATMENT PLANNING NOTE  DIAGNOSIS:     ICD-10-CM   1. Malignant neoplasm of lower lobe of right lung [facility]) C34.31      Site:  chest  NARRATIVE:  The patient was brought to the [facility].  Identity was confirmed.  All relevant records and images related to the planned course of therapy were reviewed.   Written consent to proceed with treatment was confirmed which was freely given after reviewing the details related to the planned course of therapy had been reviewed with the patient.  Then, the patient was set-up in a stable reproducible  supine position for radiation therapy.  CT images were obtained.  Surface markings were placed.    Medically necessary complex treatment device(s) for immobilization:  Customized vac-lock bag.   The CT images were loaded into the planning software.  Then the target and avoidance structures were contoured.  Treatment planning then occurred.  The radiation prescription was entered and confirmed.  A total of 3 complex treatment devices were fabricated which relate to the designed radiation treatment fields. Additional reduced fields will be used as necessary to improve the dose homogeneity of the plan. Each of these customized fields/ complex treatment devices will be used on a daily basis during the radiation course. I have requested : 3D Simulation  I have requested a DVH of the following structures: target volume, spinal cord, lungs, heart.   The patient will undergo daily image guidance to ensure accurate localization of the target, and adequate minimize dose to the normal surrounding structures in close proximity to the target.   PLAN:  The patient will receive 30 Gy in 10 fractions.    ________________________________   Jodelle Gross, MD, PhD

## 2017-07-27 NOTE — Progress Notes (Signed)
Patient ID: Wanda James, female   DOB: 09-08-1955, 62 y.o.   MRN: [id number]                                                                PROGRESS NOTE                                                                                                                                                                                                             Patient Demographics:    Wanda James, is a 62 y.o. female, DOB - 1955/11/10, JOI:[id number]  Admit date - 07/13/2017   Admitting Physician Jani Gravel, MD  Outpatient Primary MD for the patient is Althisar, Peggyann Juba  LOS - 4  Outpatient Specialists:    Dr. Holley Dexter  Chief Complaint  Patient presents with  . Chest Pain       Brief Narrative  62 y.o. female, w h/o renal cell carcinoma, nicotine dep, Copd on home o2, apparently presents with c/o Sharp chest pain of the xyphoid process starting last nite. + sob, wheezing. + cough, mostly dry.  Denies fever, chills, palp, n/v, diarrhea, brbpr, dysuria, hematuria.    In Ed,  CT scan chest/ abd  IMPRESSION: 1. Partial right lower lobe collapse/atelectasis with displacement of the right major fissure and caudal displacement of the right hilum. Differential considerations for this finding may be secondary to mucous impaction. A right infrahilar mass or endobronchial lesion causing the atelectasis are not entirely excluded. Mediastinal lymphadenopathy is also noted concerning for possible metastatic disease though reactive lymph nodes might also account for this. 2. Subtle hypodensity masslike abnormalities of the unenhanced liver worrisome for possible metastatic disease in light of the pulmonary findings. CT or MRI with IV contrast would be preferable for further assessment. 3. Left nephrectomy. Compensatory hypertrophy of the right kidney with nonobstructing interpolar calculus possibly vascular. 4. Partial left colectomy. 5. Moderate atherosclerosis of the abdominal aorta and branch  vessels.   Trop <0.03 Wbc 6.8, Hgb 14.5, Plt 304 Na 138, K 3.7, Bun 11, Creatinine 0.81 Ast 193, Alt 129, Alk phos 518, T. Bili 0.3 Lipase 99  Lactic acid 2.27  Pt will be admitted for chest pain, Copd exacerbation, Hcap, and abnormal liver function       Subjective:    Wanda  James today has been afebrile over nite.  Awaiting biopsy results.    No headache, No chest pain, No abdominal pain - No Nausea, No brbpr.  No new weakness tingling or numbness, No Cough -, has chronic dyspnea     Assessment  & Plan :    Principal Problem:   Chest pain Active Problems:   COPD exacerbation (HCC)   HCAP (healthcare-associated pneumonia)   Cancer associated pain   Metastases to the liver [facility])   Other constipation   1.  Right hilar mass. Liver metastasis. Suspected to have pneumonia with a work-up was suggestive of for large right lung mass. Pulmonary consulted and felt that the patient should get liver biopsy to help with staging as well. IR consulted and the biopsy  07/26/2017. Results pending If the biopsy is negative patient will require bronchoscopy and biopsy as well. Currently no significant wheezing appreciated. MRI brain 7/23 negative for metastatic disease Currently on decadrom '4mg'$  po bid Awaiting biopsy to see what optimum treatment is Oncology consulted, appreciate input  2.  Postobstructive pneumonia. Suspected healthcare associated pneumonia. CT chest also suggested a possible postobstructive pneumonia versus atelectasis. Pulmonary consulted as well. Patient was on IV Solu-Medrol although I do not suspect that the patient does have any active COPD exacerbation and therefore will transition to oral prednisone.=>off MRSA PCR is negative therefore will discontinue vancomycin. Blood cultures so far negative as well. Continue cefepime for now and will likely transition her to oral Augmentin for the same. Monitor other recommendation from pulmonary. History of  COPD continue home inhalers.  3.  Chest pain. Anxiety. Chronic low back pain. Patient is on tramadol for pain control which is not adequate and therefore I will increase her pain regimen to Percocet. Continue Dilaudid for now. Patient was informed that I would prefer to use oral narcotics or IV to monitor her response to this medication so that further adjustment can be made. We will place her on scheduled MS Contin 30 mg twice daily. Currently significant anxious and in distress, PRN Ativan ordered. If continues to have significant anxiety and difficult control pain and will consult palliative care for input. Currently do not have a diagnosis and therefore do not have a plan of care regarding her cancer and therefore will hold off on palliative care consultation for goals of care.  4.  History of renal cell carcinoma S/P left renal nephrectomy. Avoid nephrotoxic agent. Monitor.  5 active smoker. More than 40 years of history. Nicotine patch.  6.  Type 2 diabetes mellitus. Continue sliding scale insulin.  Diet: carb modified diet DVT Prophylaxis: subcutaneous Heparin  Advance goals of care discussion: full code  Family Communication: family was present at bedside, at the time of interview. The pt provided permission to discuss medical plan with the family. Opportunity was given to ask question and all questions were answered satisfactorily.   Disposition:  Discharge to be determined.  Consultants: PCCM, IR, oncology Procedures: Liver biopsy        Lab Results  Component Value Date   PLT 290 07/27/2017    Antibiotics  :  vanco / cefepime 7/20, 7/21, cefepime 7/22=>  Anti-infectives (From admission, onward)   Start     Dose/Rate Route Frequency Ordered Stop   07/24/17 2200  vancomycin (VANCOCIN) IVPB 750 mg/150 ml premix  Status:  Discontinued     750 mg 150 mL/hr over 60 Minutes Intravenous Every 24 hours 07/21/2017 2334 07/25/17 1327   07/24/17 0600   ceFEPIme (  MAXIPIME) 1 g in sodium chloride 0.9 % 100 mL IVPB     1 g 200 mL/hr over 30 Minutes Intravenous Every 8 hours 07/10/2017 2322 08/01/17 0559   07/22/2017 2145  vancomycin (VANCOCIN) IVPB 1000 mg/200 mL premix     1,000 mg 200 mL/hr over 60 Minutes Intravenous  Once 07/22/2017 2143 07/22/2017 2353   08/01/2017 2145  ceFEPIme (MAXIPIME) 2 g in sodium chloride 0.9 % 100 mL IVPB     2 g 200 mL/hr over 30 Minutes Intravenous  Once 07/26/2017 2143 08/02/2017 2230        Objective:   Vitals:   07/26/17 1755 07/26/17 2008 07/26/17 2101 07/27/17 0627  BP: 104/67 110/67  106/66  Pulse: 89 94  85  Resp:  20  18  Temp: 98.2 F (36.8 C) 97.8 F (36.6 C)  98.1 F (36.7 C)  TempSrc: Oral Oral  Oral  SpO2: 96% 94% 94% 92%  Weight:      Height:        Wt Readings from Last 3 Encounters:  07/24/17 50.7 kg (111 lb 12.4 oz)  06/26/17 53.4 kg (117 lb 12.8 oz)  08/19/16 53.5 kg (118 lb)     Intake/Output Summary (Last 24 hours) at 07/27/2017 0804 Last data filed at 07/26/2017 1700 Gross per 24 hour  Intake 340 ml  Output -  Net 340 ml     Physical Exam  Awake Alert, Oriented X 3, No new F.N deficits, Normal affect [place].AT,PERRAL Supple Neck,No JVD, No cervical lymphadenopathy appriciated.  Symmetrical Chest wall movement, Good air movement bilaterally, CTAB RRR,No Gallops,Rubs or new Murmurs, No Parasternal Heave +ve B.Sounds, Abd Soft, No tenderness, No organomegaly appriciated, No rebound - guarding or rigidity. No Cyanosis, Clubbing or edema, No new Rash or bruise      Data Review:    CBC Recent Labs  Lab 07/15/2017 2025 07/16/2017 2026 07/24/17 0443 07/25/17 0445 07/27/17 0504  WBC 6.8  --  5.9 8.6 7.6  HGB 14.5 15.6* 13.2 12.2 12.1  HCT 43.7 46.0 40.8 37.8 38.1  PLT 304  --  254 280 290  MCV 88.6  --  90.1 89.8 90.7  MCH 29.4  --  29.1 29.0 28.8  MCHC 33.2  --  32.4 32.3 31.8  RDW 14.7  --  15.0 15.4 16.0*  LYMPHSABS 0.8  --   --   --  0.6*  MONOABS 0.8  --   --   --   0.0*  EOSABS 0.2  --   --   --  0.0  BASOSABS 0.0  --   --   --  0.0    Chemistries  Recent Labs  Lab 07/09/2017 2025 07/09/2017 2026 07/24/17 0443 07/25/17 0445 07/26/17 0446 07/27/17 0504  NA 138 136 138 138 138 139  K 3.7 3.7 4.4 4.0 4.0 4.7  CL 99 99 104 104 102 103  CO2 26  --  '25 24 26 29  '$ GLUCOSE 118* 116* 143* 129* 97 101*  BUN '11 9 11 14 22 23  '$ CREATININE 0.81 0.80 0.74 0.64 0.59 0.54  CALCIUM 9.1  --  8.6* 8.5* 8.5* 8.5*  MG  --   --   --  1.8  --  2.3  AST 193*  --  168* 132* 125* 118*  ALT 129*  --  114* 105* 99* 94*  ALKPHOS 518*  --  479* 417* 447* 470*  BILITOT 0.3  --  0.8 0.7 0.7 0.8   ------------------------------------------------------------------------------------------------------------------  No results for input(s): CHOL, HDL, LDLCALC, TRIG, CHOLHDL, LDLDIRECT in the last 72 hours.  Lab Results  Component Value Date   HGBA1C 6.8 (H) 06/28/2017   ------------------------------------------------------------------------------------------------------------------ No results for input(s): TSH, T4TOTAL, T3FREE, THYROIDAB in the last 72 hours.  Invalid input(s): FREET3 ------------------------------------------------------------------------------------------------------------------ No results for input(s): VITAMINB12, FOLATE, FERRITIN, TIBC, IRON, RETICCTPCT in the last 72 hours.  Coagulation profile Recent Labs  Lab 07/25/17 1124  INR 1.23    No results for input(s): DDIMER in the last 72 hours.  Cardiac Enzymes Recent Labs  Lab 07/24/2017 2359 07/24/17 0443 07/24/17 1116  TROPONINI <0.03 <0.03 <0.03   ------------------------------------------------------------------------------------------------------------------    Component Value Date/Time   BNP 25.0 06/26/2017 1506    Inpatient Medications  Scheduled Meds: . albuterol  2.5 mg Nebulization TID  . aspirin EC  325 mg Oral Daily  . dexamethasone  4 mg Oral Q12H  . enoxaparin (LOVENOX)  injection  40 mg Subcutaneous Q24H  . feeding supplement (PRO-STAT SUGAR FREE 64)  30 mL Oral BID  . guaiFENesin  600 mg Oral BID  . lipase/protease/amylase  12,000 Units Oral TID AC  . mouth rinse  15 mL Mouth Rinse BID  . mometasone-formoterol  2 puff Inhalation BID  . morphine  30 mg Oral Q12H  . nicotine  21 mg Transdermal Daily  . senna-docusate  1 tablet Oral BID  . tiotropium  18 mcg Inhalation Daily   Continuous Infusions: . ceFEPime (MAXIPIME) IV Stopped (07/27/17 8937)   PRN Meds:.albuterol, HYDROmorphone (DILAUDID) injection, LORazepam, morphine, ondansetron (ZOFRAN) IV, promethazine, zolpidem  Micro Results Recent Results (from the past 240 hour(s))  Culture, blood (routine x 2) Call MD if unable to obtain prior to antibiotics being given     Status: None (Preliminary result)   Collection Time: 07/10/2017 11:59 PM  Result Value Ref Range Status   Specimen Description   Final    BLOOD LEFT ANTECUBITAL Performed at [facility] [address] [place], [place] [address]    Special Requests   Final    BOTTLES DRAWN AEROBIC AND ANAEROBIC Blood Culture results may not be optimal due to an excessive volume of blood received in culture bottles Performed at [facility] [address], [place], [place] [address]    Culture   Final    NO GROWTH 2 DAYS Performed at [facility] [address], [place], [place] [address]    Report Status PENDING  Incomplete  Culture, blood (routine x 2) Call MD if unable to obtain prior to antibiotics being given     Status: None (Preliminary result)   Collection Time: 07/25/17  4:45 AM  Result Value Ref Range Status   Specimen Description   Final    BLOOD RIGHT ANTECUBITAL Performed at [facility] [address], [place], [place] [address]    Special Requests   Final    BOTTLES DRAWN AEROBIC ONLY Blood Culture adequate volume Performed at [facility] [address] [place], [place] [address]    Culture   Final    NO GROWTH 1 DAY Performed at [facility], [place] [address], [place], [place] [address]    Report Status PENDING  Incomplete  MRSA PCR Screening     Status: None   Collection Time: 07/25/17  8:04 AM  Result Value Ref Range Status   MRSA by PCR NEGATIVE NEGATIVE Final    Comment:  The GeneXpert MRSA Assay (FDA approved for NASAL specimens only), is one component of a comprehensive MRSA colonization surveillance program. It is not intended to diagnose MRSA infection nor to guide or monitor treatment for MRSA infections. Performed at [facility], [place] [address] [place], [place] [address]     Radiology Reports Ct Abdomen Pelvis Wo Contrast  Result Date: 07/13/2017 CLINICAL DATA:  Mid chest and back pain. Patient was recently treated for pneumonia and has L4 and L5 fractures coughing. EXAM: CT CHEST, ABDOMEN AND PELVIS WITHOUT CONTRAST TECHNIQUE: Multidetector CT imaging of the chest, abdomen and pelvis was performed following the standard protocol without IV contrast. COMPARISON:  CXR from the same day, 06/29/2017 CXR and 01/31/2015 CT AP FINDINGS: CT CHEST FINDINGS Cardiovascular: Conventional branch pattern of the great vessels with atherosclerosis of the proximal great vessels. Mild atherosclerosis of the nonaneurysmal thoracic aorta measuring up to 3.3 cm along the ascending portion. Nondilated main pulmonary artery. Heart size is normal without pericardial effusion. Minimal coronary arteriosclerosis along the distal LAD. Mediastinum/Nodes: Mediastinal adenopathy is identified with largest lymph node right lower paratracheal measuring 2.2 cm in short axis. A 1.1 cm AP window lymph node is also noted. The trachea and mainstem bronchi are patent. Occlusion of the right lower lobe bronchus and branch vessels is noted. Lungs/Pleura: There is a confluent area of partially atelectatic/partially  collapsed right lower lobe with displacement of the major fissure and inferior displacement of the right hilum from volume loss. This is likely related to obstruction of right lower lobe pulmonary bronchi possibly from mucus inspissation given somewhat tubular hypodensity new noted within the visualized bronchi. Alternatively an endobronchial lesion is not excluded or unlikely an extrinsic infrahilar mass causing extrinsic impression given the subtle tubular soft tissue opacities noted within the expected bronchi. Musculoskeletal: Superior endplate Schmorl's node of T10. No aggressive osseous lesions. CT ABDOMEN PELVIS FINDINGS Hepatobiliary: Inhomogeneous masslike hypodensities are noted of the liver concerning for liver lesions possibly metastatic given findings in the chest. No biliary dilatation is identified. The gallbladder is unremarkable. Pancreas: Normal Spleen: Normal size spleen without focal mass. Adrenals/Urinary Tract: Status post left nephrectomy. Right adrenal gland and kidney are nonacute with compensatory hypertrophy of the right kidney. Punctate renal pelvic calcifications seen on the right possibly vascular or a nonobstructing calculus. Left adrenal gland is not well visualized and may be surgically absent as well. Nodularity in the expected location of the adrenal gland is felt to represent partial volume averaging of the tail of the pancreas. Stomach/Bowel: The stomach is decompressed in appearance. Normal small bowel rotation without obstruction or inflammation. The distal and terminal ileum are normal. Normal appearing appendix is noted. Increased stool burden within the colon compatible constipation. No large bowel inflammation. Partial colectomy on the left with sutures noted in the region of the rectosigmoid. Vascular/Lymphatic: Moderate to marked atherosclerosis of the abdominal aorta and branch vessels. Small retroperitoneal lymph nodes are identified without pathologic enlargement.  Reproductive: Hysterectomy.  No adnexal mass. Other: No free air nor free fluid. Musculoskeletal: Degenerative disc disease L1 through L4. No aggressive osseous lesions identified. IMPRESSION: 1. Partial right lower lobe collapse/atelectasis with displacement of the right major fissure and caudal displacement of the right hilum. Differential considerations for this finding may be secondary to mucous impaction. A right infrahilar mass or endobronchial lesion causing the atelectasis are not entirely excluded. Mediastinal lymphadenopathy is also noted concerning for possible metastatic disease though reactive lymph nodes might also account for this. 2. Subtle  hypodensity masslike abnormalities of the unenhanced liver worrisome for possible metastatic disease in light of the pulmonary findings. CT or MRI with IV contrast would be preferable for further assessment. 3. Left nephrectomy. Compensatory hypertrophy of the right kidney with nonobstructing interpolar calculus possibly vascular. 4. Partial left colectomy. 5. Moderate atherosclerosis of the abdominal aorta and branch vessels. Electronically Signed   By: Ashley Royalty M.D.   On: 07/09/2017 21:33   Dg Chest 2 View  Result Date: 07/30/2017 CLINICAL DATA:  Chest pain EXAM: CHEST - 2 VIEW COMPARISON:  June 29, 2017 FINDINGS: There is extensive right lower lobe airspace consolidation consistent with pneumonia. Lungs elsewhere clear. Heart size and pulmonary vascularity are normal. No adenopathy. There is an old healed fracture of the lateral right sixth rib. There is aortic atherosclerosis. IMPRESSION: Extensive consolidation right lower lobe consistent with pneumonia. Lungs elsewhere clear. Heart size normal. There is aortic atherosclerosis. Aortic Atherosclerosis (ICD10-I70.0). Electronically Signed   By: Lowella Grip III M.D.   On: 07/31/2017 20:57   Ct Chest Wo Contrast  Result Date: 07/20/2017 CLINICAL DATA:  Mid chest and back pain. Patient was  recently treated for pneumonia and has L4 and L5 fractures coughing. EXAM: CT CHEST, ABDOMEN AND PELVIS WITHOUT CONTRAST TECHNIQUE: Multidetector CT imaging of the chest, abdomen and pelvis was performed following the standard protocol without IV contrast. COMPARISON:  CXR from the same day, 06/29/2017 CXR and 01/31/2015 CT AP FINDINGS: CT CHEST FINDINGS Cardiovascular: Conventional branch pattern of the great vessels with atherosclerosis of the proximal great vessels. Mild atherosclerosis of the nonaneurysmal thoracic aorta measuring up to 3.3 cm along the ascending portion. Nondilated main pulmonary artery. Heart size is normal without pericardial effusion. Minimal coronary arteriosclerosis along the distal LAD. Mediastinum/Nodes: Mediastinal adenopathy is identified with largest lymph node right lower paratracheal measuring 2.2 cm in short axis. A 1.1 cm AP window lymph node is also noted. The trachea and mainstem bronchi are patent. Occlusion of the right lower lobe bronchus and branch vessels is noted. Lungs/Pleura: There is a confluent area of partially atelectatic/partially collapsed right lower lobe with displacement of the major fissure and inferior displacement of the right hilum from volume loss. This is likely related to obstruction of right lower lobe pulmonary bronchi possibly from mucus inspissation given somewhat tubular hypodensity new noted within the visualized bronchi. Alternatively an endobronchial lesion is not excluded or unlikely an extrinsic infrahilar mass causing extrinsic impression given the subtle tubular soft tissue opacities noted within the expected bronchi. Musculoskeletal: Superior endplate Schmorl's node of T10. No aggressive osseous lesions. CT ABDOMEN PELVIS FINDINGS Hepatobiliary: Inhomogeneous masslike hypodensities are noted of the liver concerning for liver lesions possibly metastatic given findings in the chest. No biliary dilatation is identified. The gallbladder is  unremarkable. Pancreas: Normal Spleen: Normal size spleen without focal mass. Adrenals/Urinary Tract: Status post left nephrectomy. Right adrenal gland and kidney are nonacute with compensatory hypertrophy of the right kidney. Punctate renal pelvic calcifications seen on the right possibly vascular or a nonobstructing calculus. Left adrenal gland is not well visualized and may be surgically absent as well. Nodularity in the expected location of the adrenal gland is felt to represent partial volume averaging of the tail of the pancreas. Stomach/Bowel: The stomach is decompressed in appearance. Normal small bowel rotation without obstruction or inflammation. The distal and terminal ileum are normal. Normal appearing appendix is noted. Increased stool burden within the colon compatible constipation. No large bowel inflammation. Partial colectomy on the left with sutures  noted in the region of the rectosigmoid. Vascular/Lymphatic: Moderate to marked atherosclerosis of the abdominal aorta and branch vessels. Small retroperitoneal lymph nodes are identified without pathologic enlargement. Reproductive: Hysterectomy.  No adnexal mass. Other: No free air nor free fluid. Musculoskeletal: Degenerative disc disease L1 through L4. No aggressive osseous lesions identified. IMPRESSION: 1. Partial right lower lobe collapse/atelectasis with displacement of the right major fissure and caudal displacement of the right hilum. Differential considerations for this finding may be secondary to mucous impaction. A right infrahilar mass or endobronchial lesion causing the atelectasis are not entirely excluded. Mediastinal lymphadenopathy is also noted concerning for possible metastatic disease though reactive lymph nodes might also account for this. 2. Subtle hypodensity masslike abnormalities of the unenhanced liver worrisome for possible metastatic disease in light of the pulmonary findings. CT or MRI with IV contrast would be preferable  for further assessment. 3. Left nephrectomy. Compensatory hypertrophy of the right kidney with nonobstructing interpolar calculus possibly vascular. 4. Partial left colectomy. 5. Moderate atherosclerosis of the abdominal aorta and branch vessels. Electronically Signed   By: Ashley Royalty M.D.   On: 07/20/2017 21:33   Mr Jeri Cos SV Contrast  Result Date: 07/26/2017 CLINICAL DATA:  Non-small cell lung cancer, here for staging. History of type 1 Chiari malformation, stroke. EXAM: MRI HEAD WITHOUT AND WITH CONTRAST TECHNIQUE: Multiplanar, multiecho pulse sequences of the brain and surrounding structures were obtained without and with intravenous contrast. CONTRAST:  34m MULTIHANCE GADOBENATE DIMEGLUMINE 529 MG/ML IV SOLN COMPARISON:  MRI head May 29, 2008 FINDINGS: Sequences vary from moderate to severely motion degraded. INTRACRANIAL CONTENTS: No reduced diffusion to suggest acute ischemia or hypercellular tumor. No susceptibility artifact to suggest hemorrhage. Biparietal bilateral occipital lobe encephalomalacia. No overall parenchymal brain volume loss for age. A few scattered subcentimeter supratentorial white matter FLAIR T2 hyperintensities compatible with chronic small vessel ischemic changes. 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 No midline shift, mass effect or masses. No abnormal extra-axial fluid collections. Low-lying cerebellar tonsils difficult to quantify due to motion. VASCULAR: Normal major intracranial vascular flow voids present at skull base. SKULL AND UPPER CERVICAL SPINE: No abnormal sellar expansion. Heterogeneous bone marrow signal. Craniocervical junction maintained. SINUSES/ORBITS: The mastoid air-cells and included paranasal sinuses are well-aerated.The included ocular globes and orbital contents are non-suspicious. Status post bilateral ocular lens implants. OTHER: None. IMPRESSION: 1. Moderate to severely motion degraded examination. 2. No identified intracranial metastasis,  however motion decreases sensitivity for small metastasis or leptomeningeal disease. If continued concern for metastatic disease, sedation may be warranted. 3. Nonspecific heterogeneous bone marrow signal, infiltrative tumor not excluded 4. 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 Electronically Signed   By: CElon AlasM.D.   On: 07/26/2017 19:35   Mr Abdomen W Wo Contrast  Result Date: 07/24/2017 CLINICAL DATA:  LEFT lower lobe collapse difficulty breathing. Indeterminate hepatic lesions identified on noncontrast CT. History of renal cell carcinoma. LEFT nephrectomy EXAM: MRI ABDOMEN WITHOUT AND WITH CONTRAST TECHNIQUE: Multiplanar multisequence MR imaging of the abdomen was performed both before and after the administration of intravenous contrast. CONTRAST:  146mMULTIHANCE GADOBENATE DIMEGLUMINE 529 MG/ML IV SOLN COMPARISON:  CT 07/11/2017 FINDINGS: Lower chest: A complete collapse of the RIGHT lower lobe with obstruction of the RIGHT lower lobe bronchus. On coronal imaging there is masslike lesion surrounding the bronchus intermedius measuring 6.4 by 4.1 cm (image 16/6 per Hepatobiliary: Too numerous to count round enhancing lesions extensively involving the LEFT RIGHT hepatic lobe. Lesions range  in size from 10 mm to 33 mm. Approximately 100 lesions. Largest lesion occupies the LEFT hepatic lobe measures 6.4 cm large example lesion in the RIGHT hepatic lobe measures 3.6 cm in the dome (image 27/9 0 point. There is no biliary duct dilatation. The gallbladder and common bile duct are normal. Pancreas: Pancreas is normal. No ductal dilatation. No pancreatic inflammation. Spleen: Normal spleen Adrenals/urinary tract: Adrenal glands are normal. Post LEFT nephrectomy. Compensatory hypertrophy of the RIGHT kidney. No RIGHT renal lesion. Stomach/Bowel: Stomach and limited view of the bowel is unremarkable. Vascular/Lymphatic: Normal abdominal aorta. No  significant adenopathy Reproductive: Other: No free fluid. Musculoskeletal: No aggressive osseous lesion. IMPRESSION: 1. Large RIGHT hilar mass surrounds the bronchus intermedius and extends into the mediastinum consistent with primary malignancy versus metastatic lesion. This lesion obstructs the RIGHT lower bronchus with complete atelectasis of the RIGHT lower lobe. 2. 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 Lesions reached near confluence and and number approximately 100. 3. LEFT nephrectomy.  Normal hypertrophy RIGHT kidney. 4. No bony metastasis identified Electronically Signed   By: Suzy Bouchard M.D.   On: 07/24/2017 15:28   US Biopsy (liver)  Result Date: 07/26/2017 INDICATION: Large right hilar mass, innumerable liver metastases EXAM: ULTRASOUND GUIDED CORE BIOPSY OF RIGHT LIVER LESION MEDICATIONS: 1% lidocaine local ANESTHESIA/SEDATION: Versed 1.'5mg'$  IV; Fentanyl 59mg IV; Moderate Sedation Time:  10 minutes The patient was continuously monitored during the procedure by the interventional radiology nurse under my direct supervision. FLUOROSCOPY TIME:  Fluoroscopy Time: None. COMPLICATIONS: None immediate. PROCEDURE: The procedure, risks, benefits, and alternatives were explained to the patient. Questions regarding the procedure were encouraged and answered. The patient understands and consents to the procedure. Previous imaging reviewed. Preliminary ultrasound performed. A right hepatic lesion was demonstrated along the right subcostal margin adjacent to the gallbladder. Overlying skin marked. Under sterile conditions and local anesthesia, a 17 gauge 6.8 cm access needle was advanced to the lesion. Needle position confirmed with ultrasound. 18 gauge core biopsies obtained. Images obtained for documentation. No immediate complication. Patient tolerated the biopsy well. FINDINGS: Imaging confirms needle placed in the right hepatic lesion for core biopsy. IMPRESSION: Successful  ultrasound right hepatic mass 18 gauge core biopsy Electronically Signed   By: MJerilynn Mages  Shick M.D.   On: 07/26/2017 16:34   Dg Chest Port 1 View  Result Date: 06/29/2017 CLINICAL DATA:  Short of breath EXAM: PORTABLE CHEST 1 VIEW COMPARISON:  06/28/2017 FINDINGS: Normal heart size. Hyperaeration. Stable airspace disease at the right base. Persistent volume loss in the right lung with shift of the mediastinum to the right. No pneumothorax. No definite pleural effusion. IMPRESSION: Stable airspace disease at the right lung base with volume loss. Electronically Signed   By: AMarybelle KillingsM.D.   On: 06/29/2017 06:59   Dg Chest Port 1 View  Result Date: 06/28/2017 CLINICAL DATA:  Shortness of breath EXAM: PORTABLE CHEST 1 VIEW COMPARISON:  06/26/2017 FINDINGS: Cardiac shadow is stable. The lungs are well aerated bilaterally with persistent right basilar infiltrate. No focal effusion is seen. No bony abnormality is noted. IMPRESSION: Persistent right basilar infiltrate.  The left lung remains clear. Electronically Signed   By: MInez CatalinaM.D.   On: 06/28/2017 07:52    Time Spent in minutes  30   JJani GravelM.D on 07/27/2017 at 8:04 AM  Between 7am to 7pm - Pager - [phone]   After 7pm go to [email or web address] - password [email or web address] -  Office  [phone]

## 2017-07-27 NOTE — Progress Notes (Signed)
Nutrition Follow-up  DOCUMENTATION CODES:   Not applicable  INTERVENTION:    Trial Magic cup TID with meals, each supplement provides 290 kcal and 9 grams of protein   D/C 30 ml Prostat BID, each supplement provides 100 kcals and 15 grams protein.   NUTRITION DIAGNOSIS:   Moderate Malnutrition related to chronic illness(COPD/recurrent PNA) as evidenced by energy intake < 75% for > or equal to 1 month, percent weight loss.  Ongoing  GOAL:   Patient will meet greater than or equal to 90% of their needs  Not meeting  MONITOR:   PO intake, Supplement acceptance, Weight trends, Labs  REASON FOR ASSESSMENT:   Malnutrition Screening Tool    ASSESSMENT:   Patient with PMH significant for renal cell carcinoma, tobacco abuse, COPD, and stroke. Presents this admission with complaints of chest pain. Admitted for right lower lobe lung collapse with possible lung mass and possible mass in the liver.    Family requesting to have different supplement provided. Spoke with pt regarding hospital options. She does not like Ensure, Boost Plus, Prostat, Premier Protein, or [employer]. Discussed Magic cup option that is much like ice cream. She is amendable to trying this.   Family requesting an appetite stimulant. Discussed with RN stimulant options. Messaged Dr. Jeneen Rinks to see if we can order Megace.   Pt is currently on a carbohydrate modified diet, and has not consumed her first meal. Will continue to encourage PO intake.   Medications reviewed and include: creon, prosight MVI Labs reviewed.   Diet Order:   Diet Order           Diet Carb Modified Fluid consistency: Thin; Room service appropriate? Yes  Diet effective now          EDUCATION NEEDS:   Education needs have been addressed  Skin:  Skin Assessment: Reviewed RN Assessment  Last BM:  07/24/17  Height:   Ht Readings from Last 1 Encounters:  07/25/17 5\' 2"  (1.575 m)    Weight:   Wt Readings from Last 1  Encounters:  07/24/17 111 lb 12.4 oz (50.7 kg)    Ideal Body Weight:  50 kg  BMI:  Body mass index is 20.44 kg/m.  Estimated Nutritional Needs:   Kcal:  1450-1650 kcal  Protein:  70-80 grams   Fluid:  >1.4 L/day    Mariana Single RD, LDN Clinical Nutrition Pager # - [phone]

## 2017-07-27 NOTE — Consult Note (Signed)
Radiation Oncology         [phone] ________________________________  Name: Wanda James        MRN: [id number]  Date of Service: 07/27/17              DOB: 14-Jan-1955  CC:Althisar, Mallie Mussel, PA-C  No ref. provider found     REFERRING PHYSICIAN: Gorsuch  DIAGNOSIS: The primary encounter diagnosis was HCAP (healthcare-associated pneumonia). Diagnoses of Liver metastases ([place]), Metastases to the liver [facility]), COPD exacerbation ([place]), Cancer associated pain, Other constipation, and Generalized anxiety disorder were also pertinent to this visit.   HISTORY OF PRESENT ILLNESS: Wanda James is a 62 y.o. female seen at the request of Dr. Alvy Bimler for a newly noted metastatic carcinoma. The patient has a remote history of left nephrectomy for early stage renal cell carcinoma over 20 years ago. She has been NED without adjuvant therapy. Recently she was admitted due to a RLL pneumonia, and despite antibiotics she did not notice resolution of her symptoms of shortness of breath and chest pain. She presented for evaluation and was found ot have a large mediastinal mass resulting postobstructive pneumonia and partial collapse of the RLL, with displacement of the major fissure and inferior displacement of the right hilum. She had additional evidence of mass like hypodensities in the liver. She has undergone liver biopsy yesterday afternoon. We're asked to see her to discuss palliative radiotherapy to the right chest.    PREVIOUS RADIATION THERAPY: No   PAST MEDICAL HISTORY:  Past Medical History:  Diagnosis Date  . Acid reflux   . Arthritis   . Cancer ([place])    kidney  . Cataract   . Chiari malformation type I ([place])   . Complication of anesthesia    Hard To Awaken  . COPD (chronic obstructive pulmonary disease) ([place])   . Diverticulitis   . Headache   . Pneumonia   . Stroke ([place])   . Subclavian artery stenosis, left (H[facility])    s/p angioplasty/stent 04/25/08       PAST SURGICAL HISTORY: Past  Surgical History:  Procedure Laterality Date  . ABDOMINAL HYSTERECTOMY     partial  . BREAST SURGERY     left lumpectomy  . carotidendarterectomy    . COLON SURGERY    . EYE SURGERY    . INNER EAR SURGERY     right  . LUMBAR LAMINECTOMY/DECOMPRESSION MICRODISCECTOMY N/A 08/19/2016   Procedure: LUMBAR 4-5 DECOMPRESSION;  Surgeon: Phylliss Bob, MD;  Location: [facility];  Service: Orthopedics;  Laterality: N/A;  LUMBAR 4-5 DECOMPRESSION REQUESTED TIME 2.5 HRS   . NEPHRECTOMY     left  . PARTIAL COLECTOMY    . RHINOPLASTY    . TONSILLECTOMY    . TUBAL LIGATION       FAMILY HISTORY:  Family History  Problem Relation Age of Onset  . Lung cancer Father      SOCIAL HISTORY:  reports that she has been smoking cigarettes.  She has been smoking about 1.00 pack per day. She has never used smokeless tobacco. She reports that she does not drink alcohol or use drugs.   ALLERGIES: Iohexol; Sodium hypochlorite; Penicillins; Hydrocodone; and Tape   MEDICATIONS:  Current Facility-Administered Medications  Medication Dose Route Frequency Provider Last Rate Last Dose  . albuterol (PROVENTIL) (2.5 MG/3ML) 0.083% nebulizer solution 2.5 mg  2.5 mg Nebulization Q6H PRN Jani Gravel, MD   2.5 mg at 07/08/2017 2339  . albuterol (PROVENTIL) (2.5 MG/3ML) 0.083% nebulizer solution  2.5 mg  2.5 mg Nebulization TID Jani Gravel, MD   2.5 mg at 07/26/17 2101  . aspirin EC tablet 325 mg  325 mg Oral Daily Jani Gravel, MD   325 mg at 07/26/17 1020  . ceFEPIme (MAXIPIME) 1 g in sodium chloride 0.9 % 100 mL IVPB  1 g Intravenous Q8H Jani Gravel, MD   Stopped at 07/27/17 [phone]  . dexamethasone (DECADRON) tablet 4 mg  4 mg Oral Q12H Alvy Bimler, Ni, MD   4 mg at 07/26/17 2255  . enoxaparin (LOVENOX) injection 40 mg  40 mg Subcutaneous Q24H Brynda Greathouse Sue-Ellen, PA      . feeding supplement (PRO-STAT SUGAR FREE 64) liquid 30 mL  30 mL Oral BID Jani Gravel, MD   30 mL at 07/25/17 0934  . guaiFENesin (MUCINEX) 12 hr tablet  600 mg  600 mg Oral BID Jani Gravel, MD   600 mg at 07/26/17 2255  . HYDROmorphone (DILAUDID) injection 0.5 mg  0.5 mg Intravenous Q4H PRN Lavina Hamman, MD   0.5 mg at 07/26/17 0736  . lipase/protease/amylase (CREON) capsule 12,000 Units  12,000 Units Oral TID Carmelia Roller, MD   12,000 Units at 07/26/17 1718  . LORazepam (ATIVAN) tablet 0.5 mg  0.5 mg Oral BID PRN Lavina Hamman, MD   0.5 mg at 07/25/17 2300  . MEDLINE mouth rinse  15 mL Mouth Rinse BID Jani Gravel, MD   15 mL at 07/26/17 2302  . mometasone-formoterol (DULERA) 100-5 MCG/ACT inhaler 2 puff  2 puff Inhalation BID Jani Gravel, MD   2 puff at 07/26/17 2101  . morphine (MS CONTIN) 12 hr tablet 30 mg  30 mg Oral Q12H Lavina Hamman, MD   30 mg at 07/26/17 2255  . morphine (MSIR) tablet 15 mg  15 mg Oral Q3H PRN Gorsuch, Ni, MD      . nicotine (NICODERM CQ - dosed in mg/24 hours) patch 21 mg  21 mg Transdermal Daily Jani Gravel, MD   21 mg at 07/26/17 1023  . ondansetron (ZOFRAN) injection 4 mg  4 mg Intravenous Q6H PRN Jani Gravel, MD   4 mg at 07/12/2017 2353  . promethazine (PHENERGAN) injection 12.5 mg  12.5 mg Intravenous Q6H PRN Jani Gravel, MD   12.5 mg at 07/24/17 0154  . senna-docusate (Senokot-S) tablet 1 tablet  1 tablet Oral BID Heath Lark, MD   1 tablet at 07/26/17 2255  . tiotropium (SPIRIVA) inhalation capsule 18 mcg  18 mcg Inhalation Daily Jani Gravel, MD   18 mcg at 07/26/17 0802  . zolpidem (AMBIEN) tablet 5 mg  5 mg Oral QHS PRN Lavina Hamman, MD         REVIEW OF SYSTEMS: On review of systems, the patient reports that she is feeling better since coming to the hospital and her pain is under better control. She denies any current chest pain. She has found 2L O2 to be helpful at home but had felt like this wasn't enough given her shortness of breath. She denies any cough, hemoptysis. She has had about 15 pounds of weight loss over 2 weeks. She denies any bowel or bladder disturbances, and denies abdominal pain, nausea or  vomiting. She denies any new musculoskeletal or joint aches or pains. A complete review of systems is obtained and is otherwise negative.     PHYSICAL EXAM:  Wt Readings from Last 3 Encounters:  07/24/17 111 lb 12.4 oz (50.7 kg)  06/26/17 117 lb 12.8  oz (53.4 kg)  08/19/16 118 lb (53.5 kg)   Temp Readings from Last 3 Encounters:  07/27/17 98.1 F (36.7 C) (Oral)  07/01/17 97.6 F (36.4 C) (Oral)  09/15/16 98 F (36.7 C) (Oral)   BP Readings from Last 3 Encounters:  07/27/17 106/66  07/01/17 129/86  09/15/16 (!) 153/99   Pulse Readings from Last 3 Encounters:  07/27/17 85  07/01/17 79  09/15/16 77   Pain Assessment Pain Score: Asleep/10  In general this is a chronically tired caucasian female in no acute distress. She is alert and oriented x4 and appropriate throughout the examination. HEENT reveals that the patient is normocephalic, atraumatic. EOMs are intact. Skin is dry but intact without any evidence of gross lesions. Cardiovascular exam reveals a regular rate and rhythm, no clicks rubs or murmurs are auscultated. Chest is clear to auscultation on the left, but decreased sounds are noted at the right base. Lymphatic assessment is performed and does not reveal any adenopathy in the cervical, supraclavicular, axillary, or inguinal chains. Abdomen has active bowel sounds in all quadrants and is intact. The abdomen is soft, non tender, non distended. Lower extremities are negative for pretibial pitting edema, deep calf tenderness, cyanosis or clubbing.   ECOG = 3  0 - Asymptomatic (Fully active, able to carry on all predisease activities without restriction)  1 - Symptomatic but completely ambulatory (Restricted in physically strenuous activity but ambulatory and able to carry out work of a light or sedentary nature. For example, light housework, office work)  2 - Symptomatic, <50% in bed during the day (Ambulatory and capable of all self care but unable to carry out any work  activities. Up and about more than 50% of waking hours)  3 - Symptomatic, >50% in bed, but not bedbound (Capable of only limited self-care, confined to bed or chair 50% or more of waking hours)  4 - Bedbound (Completely disabled. Cannot carry on any self-care. Totally confined to bed or chair)  5 - Death   Eustace Pen MM, Creech RH, Tormey DC, et al. [phone]). "Toxicity and response criteria of the [facility]". [place] Oncol. 5 (6): 649-55    LABORATORY DATA:  Lab Results  Component Value Date   WBC 7.6 07/27/2017   HGB 12.1 07/27/2017   HCT 38.1 07/27/2017   MCV 90.7 07/27/2017   PLT 290 07/27/2017   Lab Results  Component Value Date   NA 139 07/27/2017   K 4.7 07/27/2017   CL 103 07/27/2017   CO2 29 07/27/2017   Lab Results  Component Value Date   ALT 94 (H) 07/27/2017   AST 118 (H) 07/27/2017   ALKPHOS 470 (H) 07/27/2017   BILITOT 0.8 07/27/2017      RADIOGRAPHY: Ct Abdomen Pelvis Wo Contrast  Result Date: 07/18/2017 CLINICAL DATA:  Mid chest and back pain. Patient was recently treated for pneumonia and has L4 and L5 fractures coughing. EXAM: CT CHEST, ABDOMEN AND PELVIS WITHOUT CONTRAST TECHNIQUE: Multidetector CT imaging of the chest, abdomen and pelvis was performed following the standard protocol without IV contrast. COMPARISON:  CXR from the same day, 06/29/2017 CXR and 01/31/2015 CT AP FINDINGS: CT CHEST FINDINGS Cardiovascular: Conventional branch pattern of the great vessels with atherosclerosis of the proximal great vessels. Mild atherosclerosis of the nonaneurysmal thoracic aorta measuring up to 3.3 cm along the ascending portion. Nondilated main pulmonary artery. Heart size is normal without pericardial effusion. Minimal coronary arteriosclerosis along the distal LAD. Mediastinum/Nodes: Mediastinal adenopathy  is identified with largest lymph node right lower paratracheal measuring 2.2 cm in short axis. A 1.1 cm AP window lymph node is also  noted. The trachea and mainstem bronchi are patent. Occlusion of the right lower lobe bronchus and branch vessels is noted. Lungs/Pleura: There is a confluent area of partially atelectatic/partially collapsed right lower lobe with displacement of the major fissure and inferior displacement of the right hilum from volume loss. This is likely related to obstruction of right lower lobe pulmonary bronchi possibly from mucus inspissation given somewhat tubular hypodensity new noted within the visualized bronchi. Alternatively an endobronchial lesion is not excluded or unlikely an extrinsic infrahilar mass causing extrinsic impression given the subtle tubular soft tissue opacities noted within the expected bronchi. Musculoskeletal: Superior endplate Schmorl's node of T10. No aggressive osseous lesions. CT ABDOMEN PELVIS FINDINGS Hepatobiliary: Inhomogeneous masslike hypodensities are noted of the liver concerning for liver lesions possibly metastatic given findings in the chest. No biliary dilatation is identified. The gallbladder is unremarkable. Pancreas: Normal Spleen: Normal size spleen without focal mass. Adrenals/Urinary Tract: Status post left nephrectomy. Right adrenal gland and kidney are nonacute with compensatory hypertrophy of the right kidney. Punctate renal pelvic calcifications seen on the right possibly vascular or a nonobstructing calculus. Left adrenal gland is not well visualized and may be surgically absent as well. Nodularity in the expected location of the adrenal gland is felt to represent partial volume averaging of the tail of the pancreas. Stomach/Bowel: The stomach is decompressed in appearance. Normal small bowel rotation without obstruction or inflammation. The distal and terminal ileum are normal. Normal appearing appendix is noted. Increased stool burden within the colon compatible constipation. No large bowel inflammation. Partial colectomy on the left with sutures noted in the region of  the rectosigmoid. Vascular/Lymphatic: Moderate to marked atherosclerosis of the abdominal aorta and branch vessels. Small retroperitoneal lymph nodes are identified without pathologic enlargement. Reproductive: Hysterectomy.  No adnexal mass. Other: No free air nor free fluid. Musculoskeletal: Degenerative disc disease L1 through L4. No aggressive osseous lesions identified. IMPRESSION: 1. Partial right lower lobe collapse/atelectasis with displacement of the right major fissure and caudal displacement of the right hilum. Differential considerations for this finding may be secondary to mucous impaction. A right infrahilar mass or endobronchial lesion causing the atelectasis are not entirely excluded. Mediastinal lymphadenopathy is also noted concerning for possible metastatic disease though reactive lymph nodes might also account for this. 2. Subtle hypodensity masslike abnormalities of the unenhanced liver worrisome for possible metastatic disease in light of the pulmonary findings. CT or MRI with IV contrast would be preferable for further assessment. 3. Left nephrectomy. Compensatory hypertrophy of the right kidney with nonobstructing interpolar calculus possibly vascular. 4. Partial left colectomy. 5. Moderate atherosclerosis of the abdominal aorta and branch vessels. Electronically Signed   By: Ashley Royalty M.D.   On: 07/08/2017 21:33   Dg Chest 2 View  Result Date: 07/09/2017 CLINICAL DATA:  Chest pain EXAM: CHEST - 2 VIEW COMPARISON:  June 29, 2017 FINDINGS: There is extensive right lower lobe airspace consolidation consistent with pneumonia. Lungs elsewhere clear. Heart size and pulmonary vascularity are normal. No adenopathy. There is an old healed fracture of the lateral right sixth rib. There is aortic atherosclerosis. IMPRESSION: Extensive consolidation right lower lobe consistent with pneumonia. Lungs elsewhere clear. Heart size normal. There is aortic atherosclerosis. Aortic Atherosclerosis  (ICD10-I70.0). Electronically Signed   By: Lowella Grip III M.D.   On: 08/01/2017 20:57   Ct Chest Wo Contrast  Result Date: 07/31/2017 CLINICAL DATA:  Mid chest and back pain. Patient was recently treated for pneumonia and has L4 and L5 fractures coughing. EXAM: CT CHEST, ABDOMEN AND PELVIS WITHOUT CONTRAST TECHNIQUE: Multidetector CT imaging of the chest, abdomen and pelvis was performed following the standard protocol without IV contrast. COMPARISON:  CXR from the same day, 06/29/2017 CXR and 01/31/2015 CT AP FINDINGS: CT CHEST FINDINGS Cardiovascular: Conventional branch pattern of the great vessels with atherosclerosis of the proximal great vessels. Mild atherosclerosis of the nonaneurysmal thoracic aorta measuring up to 3.3 cm along the ascending portion. Nondilated main pulmonary artery. Heart size is normal without pericardial effusion. Minimal coronary arteriosclerosis along the distal LAD. Mediastinum/Nodes: Mediastinal adenopathy is identified with largest lymph node right lower paratracheal measuring 2.2 cm in short axis. A 1.1 cm AP window lymph node is also noted. The trachea and mainstem bronchi are patent. Occlusion of the right lower lobe bronchus and branch vessels is noted. Lungs/Pleura: There is a confluent area of partially atelectatic/partially collapsed right lower lobe with displacement of the major fissure and inferior displacement of the right hilum from volume loss. This is likely related to obstruction of right lower lobe pulmonary bronchi possibly from mucus inspissation given somewhat tubular hypodensity new noted within the visualized bronchi. Alternatively an endobronchial lesion is not excluded or unlikely an extrinsic infrahilar mass causing extrinsic impression given the subtle tubular soft tissue opacities noted within the expected bronchi. Musculoskeletal: Superior endplate Schmorl's node of T10. No aggressive osseous lesions. CT ABDOMEN PELVIS FINDINGS Hepatobiliary:  Inhomogeneous masslike hypodensities are noted of the liver concerning for liver lesions possibly metastatic given findings in the chest. No biliary dilatation is identified. The gallbladder is unremarkable. Pancreas: Normal Spleen: Normal size spleen without focal mass. Adrenals/Urinary Tract: Status post left nephrectomy. Right adrenal gland and kidney are nonacute with compensatory hypertrophy of the right kidney. Punctate renal pelvic calcifications seen on the right possibly vascular or a nonobstructing calculus. Left adrenal gland is not well visualized and may be surgically absent as well. Nodularity in the expected location of the adrenal gland is felt to represent partial volume averaging of the tail of the pancreas. Stomach/Bowel: The stomach is decompressed in appearance. Normal small bowel rotation without obstruction or inflammation. The distal and terminal ileum are normal. Normal appearing appendix is noted. Increased stool burden within the colon compatible constipation. No large bowel inflammation. Partial colectomy on the left with sutures noted in the region of the rectosigmoid. Vascular/Lymphatic: Moderate to marked atherosclerosis of the abdominal aorta and branch vessels. Small retroperitoneal lymph nodes are identified without pathologic enlargement. Reproductive: Hysterectomy.  No adnexal mass. Other: No free air nor free fluid. Musculoskeletal: Degenerative disc disease L1 through L4. No aggressive osseous lesions identified. IMPRESSION: 1. Partial right lower lobe collapse/atelectasis with displacement of the right major fissure and caudal displacement of the right hilum. Differential considerations for this finding may be secondary to mucous impaction. A right infrahilar mass or endobronchial lesion causing the atelectasis are not entirely excluded. Mediastinal lymphadenopathy is also noted concerning for possible metastatic disease though reactive lymph nodes might also account for this.  2. Subtle hypodensity masslike abnormalities of the unenhanced liver worrisome for possible metastatic disease in light of the pulmonary findings. CT or MRI with IV contrast would be preferable for further assessment. 3. Left nephrectomy. Compensatory hypertrophy of the right kidney with nonobstructing interpolar calculus possibly vascular. 4. Partial left colectomy. 5. Moderate atherosclerosis of the abdominal aorta and branch vessels. Electronically Signed  By: Ashley Royalty M.D.   On: 07/10/2017 21:33   Mr Jeri Cos PJ Contrast  Result Date: 07/26/2017 CLINICAL DATA:  Non-small cell lung cancer, here for staging. History of type 1 Chiari malformation, stroke. EXAM: MRI HEAD WITHOUT AND WITH CONTRAST TECHNIQUE: Multiplanar, multiecho pulse sequences of the brain and surrounding structures were obtained without and with intravenous contrast. CONTRAST:  44mL MULTIHANCE GADOBENATE DIMEGLUMINE 529 MG/ML IV SOLN COMPARISON:  MRI head May 29, 2008 FINDINGS: Sequences vary from moderate to severely motion degraded. INTRACRANIAL CONTENTS: No reduced diffusion to suggest acute ischemia or hypercellular tumor. No susceptibility artifact to suggest hemorrhage. Biparietal bilateral occipital lobe encephalomalacia. No overall parenchymal brain volume loss for age. A few scattered subcentimeter supratentorial white matter FLAIR T2 hyperintensities compatible with chronic small vessel ischemic changes. 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 No midline shift, mass effect or masses. No abnormal extra-axial fluid collections. Low-lying cerebellar tonsils difficult to quantify due to motion. VASCULAR: Normal major intracranial vascular flow voids present at skull base. SKULL AND UPPER CERVICAL SPINE: No abnormal sellar expansion. Heterogeneous bone marrow signal. Craniocervical junction maintained. SINUSES/ORBITS: The mastoid air-cells and included paranasal sinuses are well-aerated.The included ocular globes and orbital  contents are non-suspicious. Status post bilateral ocular lens implants. OTHER: None. IMPRESSION: 1. Moderate to severely motion degraded examination. 2. No identified intracranial metastasis, however motion decreases sensitivity for small metastasis or leptomeningeal disease. If continued concern for metastatic disease, sedation may be warranted. 3. Nonspecific heterogeneous bone marrow signal, infiltrative tumor not excluded 4. 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 Electronically Signed   By: Elon Alas M.D.   On: 07/26/2017 19:35   Mr Abdomen W Wo Contrast  Result Date: 07/24/2017 CLINICAL DATA:  LEFT lower lobe collapse difficulty breathing. Indeterminate hepatic lesions identified on noncontrast CT. History of renal cell carcinoma. LEFT nephrectomy EXAM: MRI ABDOMEN WITHOUT AND WITH CONTRAST TECHNIQUE: Multiplanar multisequence MR imaging of the abdomen was performed both before and after the administration of intravenous contrast. CONTRAST:  67mL MULTIHANCE GADOBENATE DIMEGLUMINE 529 MG/ML IV SOLN COMPARISON:  CT 07/26/2017 FINDINGS: Lower chest: A complete collapse of the RIGHT lower lobe with obstruction of the RIGHT lower lobe bronchus. On coronal imaging there is masslike lesion surrounding the bronchus intermedius measuring 6.4 by 4.1 cm (image 16/6 per Hepatobiliary: Too numerous to count round enhancing lesions extensively involving the LEFT RIGHT hepatic lobe. Lesions range in size from 10 mm to 33 mm. Approximately 100 lesions. Largest lesion occupies the LEFT hepatic lobe measures 6.4 cm large example lesion in the RIGHT hepatic lobe measures 3.6 cm in the dome (image 27/9 0 point. There is no biliary duct dilatation. The gallbladder and common bile duct are normal. Pancreas: Pancreas is normal. No ductal dilatation. No pancreatic inflammation. Spleen: Normal spleen Adrenals/urinary tract: Adrenal glands are normal. Post LEFT nephrectomy.  Compensatory hypertrophy of the RIGHT kidney. No RIGHT renal lesion. Stomach/Bowel: Stomach and limited view of the bowel is unremarkable. Vascular/Lymphatic: Normal abdominal aorta. No significant adenopathy Reproductive: Other: No free fluid. Musculoskeletal: No aggressive osseous lesion. IMPRESSION: 1. Large RIGHT hilar mass surrounds the bronchus intermedius and extends into the mediastinum consistent with primary malignancy versus metastatic lesion. This lesion obstructs the RIGHT lower bronchus with complete atelectasis of the RIGHT lower lobe. 2. 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 Lesions reached near confluence and and number approximately 100. 3. LEFT nephrectomy.  Normal hypertrophy RIGHT kidney. 4. No bony metastasis identified Electronically Signed  By: Suzy Bouchard M.D.   On: 07/24/2017 15:28   US Biopsy (liver)  Result Date: 07/26/2017 INDICATION: Large right hilar mass, innumerable liver metastases EXAM: ULTRASOUND GUIDED CORE BIOPSY OF RIGHT LIVER LESION MEDICATIONS: 1% lidocaine local ANESTHESIA/SEDATION: Versed 1.5mg  IV; Fentanyl 42mcg IV; Moderate Sedation Time:  10 minutes The patient was continuously monitored during the procedure by the interventional radiology nurse under my direct supervision. FLUOROSCOPY TIME:  Fluoroscopy Time: None. COMPLICATIONS: None immediate. PROCEDURE: The procedure, risks, benefits, and alternatives were explained to the patient. Questions regarding the procedure were encouraged and answered. The patient understands and consents to the procedure. Previous imaging reviewed. Preliminary ultrasound performed. A right hepatic lesion was demonstrated along the right subcostal margin adjacent to the gallbladder. Overlying skin marked. Under sterile conditions and local anesthesia, a 17 gauge 6.8 cm access needle was advanced to the lesion. Needle position confirmed with ultrasound. 18 gauge core biopsies obtained. Images obtained for  documentation. No immediate complication. Patient tolerated the biopsy well. FINDINGS: Imaging confirms needle placed in the right hepatic lesion for core biopsy. IMPRESSION: Successful ultrasound right hepatic mass 18 gauge core biopsy Electronically Signed   By: Jerilynn Mages.  Shick M.D.   On: 07/26/2017 16:34   Dg Chest Port 1 View  Result Date: 06/29/2017 CLINICAL DATA:  Short of breath EXAM: PORTABLE CHEST 1 VIEW COMPARISON:  06/28/2017 FINDINGS: Normal heart size. Hyperaeration. Stable airspace disease at the right base. Persistent volume loss in the right lung with shift of the mediastinum to the right. No pneumothorax. No definite pleural effusion. IMPRESSION: Stable airspace disease at the right lung base with volume loss. Electronically Signed   By: Marybelle Killings M.D.   On: 06/29/2017 06:59   Dg Chest Port 1 View  Result Date: 06/28/2017 CLINICAL DATA:  Shortness of breath EXAM: PORTABLE CHEST 1 VIEW COMPARISON:  06/26/2017 FINDINGS: Cardiac shadow is stable. The lungs are well aerated bilaterally with persistent right basilar infiltrate. No focal effusion is seen. No bony abnormality is noted. IMPRESSION: Persistent right basilar infiltrate.  The left lung remains clear. Electronically Signed   By: Inez Catalina M.D.   On: 06/28/2017 07:52       IMPRESSION/PLAN: 1. Probable Stage IV Lung Cancer. Dr. Lisbeth Renshaw has reviewed the patient's imaging and course and I'vediscussed the plan to follow up with pathology findings tomorrow. We anticipate that she has a lung cancer, and as such, we will plan simulation today and follow up with pathology tomorrow prior to radiation. We discussed the risks, benefits, short, and long term effects of radiotherapy, and the patient is interested in proceeding. Dr. Lisbeth Renshaw has recommended  a course of 2 weeks of radiotherapy to the chest. Following completion of this, she will pursue systemic therapy with Dr. Alvy Bimler. Written consent is obtained and placed in the chart, a copy was  provided to the patient. She will simulate today at 9:45 am. 2. Pain secondary to #1. She will continue her current pain regimen.   In a visit lasting 70 minutes, greater than 50% of the time was spent face to face discussing her case, and coordinating the patient's care.     Carola Rhine, PAC

## 2017-07-28 ENCOUNTER — Ambulatory Visit
Admit: 2017-07-28 | Discharge: 2017-07-28 | Disposition: A | Discharge status: Home / Self Care | Payer: BLUE CROSS/BLUE SHIELD | Attending: Radiation Oncology | Admitting: Radiation Oncology

## 2017-07-28 DIAGNOSIS — J449 Chronic obstructive pulmonary disease, unspecified: Secondary | ICD-10-CM

## 2017-07-28 LAB — COMPREHENSIVE METABOLIC PANEL
ALK PHOS: 466 U/L — AB (ref 38–126)
ALT: 95 U/L — ABNORMAL HIGH (ref 0–44)
AST: 121 U/L — ABNORMAL HIGH (ref 15–41)
Albumin: 2.5 g/dL — ABNORMAL LOW (ref 3.5–5.0)
Anion gap: 7 (ref 5–15)
BILIRUBIN TOTAL: 1 mg/dL (ref 0.3–1.2)
BUN: 29 mg/dL — ABNORMAL HIGH (ref 8–23)
CALCIUM: 8.6 mg/dL — AB (ref 8.9–10.3)
CO2: 30 mmol/L (ref 22–32)
Chloride: 101 mmol/L (ref 98–111)
Creatinine, Ser: 0.63 mg/dL (ref 0.44–1.00)
Glucose, Bld: 114 mg/dL — ABNORMAL HIGH (ref 70–99)
Potassium: 5 mmol/L (ref 3.5–5.1)
Sodium: 138 mmol/L (ref 135–145)
TOTAL PROTEIN: 5.6 g/dL — AB (ref 6.5–8.1)

## 2017-07-28 LAB — CBC
HCT: 38.6 % (ref 36.0–46.0)
Hemoglobin: 12 g/dL (ref 12.0–15.0)
MCH: 28.5 pg (ref 26.0–34.0)
MCHC: 31.1 g/dL (ref 30.0–36.0)
MCV: 91.7 fL (ref 78.0–100.0)
PLATELETS: 281 10*3/uL (ref 150–400)
RBC: 4.21 MIL/uL (ref 3.87–5.11)
RDW: 16 % — AB (ref 11.5–15.5)
WBC: 8.7 10*3/uL (ref 4.0–10.5)

## 2017-07-28 MED ORDER — PRO-STAT SUGAR FREE PO LIQD
30.0000 mL | Freq: Two times a day (BID) | ORAL | Status: DC
  Filled 2017-07-28 (×5): qty 30

## 2017-07-28 MED ORDER — POLYETHYLENE GLYCOL 3350 17 G PO PACK
17.0000 g | PACK | Freq: Every day | ORAL | Status: DC
  Administered 2017-07-28 – 2017-08-04 (×5): 17 g via ORAL
  Filled 2017-07-28 (×7): qty 1

## 2017-07-28 MED ORDER — DEXAMETHASONE 2 MG PO TABS
4.0000 mg | ORAL_TABLET | Freq: Every day | ORAL | Status: DC
  Administered 2017-07-28 – 2017-08-01 (×3): 4 mg via ORAL
  Filled 2017-07-28: qty 1
  Filled 2017-07-28: qty 2
  Filled 2017-07-28 (×3): qty 1

## 2017-07-28 MED ORDER — SENNOSIDES-DOCUSATE SODIUM 8.6-50 MG PO TABS
2.0000 | ORAL_TABLET | Freq: Two times a day (BID) | ORAL | Status: DC
  Administered 2017-07-28 – 2017-08-01 (×4): 2 via ORAL
  Filled 2017-07-28 (×7): qty 2

## 2017-07-28 MED ORDER — ESCITALOPRAM OXALATE 5 MG PO TABS
5.0000 mg | ORAL_TABLET | Freq: Every day | ORAL | Status: DC
  Administered 2017-07-28 – 2017-08-04 (×6): 5 mg via ORAL
  Filled 2017-07-28 (×9): qty 1

## 2017-07-28 MED ORDER — NICOTINE 7 MG/24HR TD PT24
7.0000 mg | MEDICATED_PATCH | Freq: Every day | TRANSDERMAL | Status: DC
  Administered 2017-07-28 – 2017-08-01 (×3): 7 mg via TRANSDERMAL
  Filled 2017-07-28 (×5): qty 1

## 2017-07-28 NOTE — Progress Notes (Signed)
I called and spoke with the patient's husband and let him know the preliminary diagnosis of cancer from her liver biopsy. IHC is pending but could be small cell carcinoma. We will proceed with palliative radiotherapy to begin today to the chest.    Carola Rhine, PAC

## 2017-07-28 NOTE — Progress Notes (Signed)
Wanda James   DOB:[birth year]   YO#:[id number]    Assessment & Plan:   Diffuse metastatic lesions to liver s/p liver biopsy, pathology pending Mediastinal lymphadenopathy with occlusion of right lower lobe bronchus causing partial right lower lobe collapse Biopsy is pending The pattern is highly suspicious for possible new primary lung cancer with widespread metastatic disease to the liver MRI negative for intracranial mets Radiation oncologist has been consulted; palliative radiation treatment to start today, planned 10 treatment I plan to schedule outpatient follow-up on Monday after her radiation treatment for discussion about plan of care and systemic options  Feeling jittery Could be either due to the dexamethasone versus nicotine patch I reduced dexamethasone to once a day and reduce nicotine patch to 7 mg  Elevated liver enzymes Due to diffuse liver involvement This may exclude certain choices of palliative chemotherapy She does not have signs of liver failure  Chronic respiratory failure due to COPD, oxygen dependent Probable post-obstructive pneumonia Continue inhalers and oxygen along with antibiotics She follows with pulmonologist  Moderate to severe protein calorie malnutrition Due to cancer Increase oral intake as tolerated  Hopefully, the dexamethasone will improve appetite  History of kidney cancer s/p nephrectomy She has adequate renal function I felt that it is unlikely due to recurrent metastatic kidney cancer given the pattern of spread of disease  Severe cancer pain Pain control is reasonably stable We discussed pain management with slow titration as needed  Chronic constipation I recommend scheduled Senokot/laxative to avoid severe constipation I will add MiraLAX  Discharge planning I have reviewed plan of care with the patient, her husband and primary service If her pain is stable by the end of the day, she can be discharged with outpatient  follow-up  Heath Lark, MD 07/28/2017  8:05 AM   Subjective  I spoke with the patient and her husband.  She complained of sensation of feeling jittery.  She has no bowel movement since Tuesday. Her pain is stable with current prescription pain medicine. MRI of the brain was negative for malignancy.  She is scheduled to start radiation today, total of 10 sessions.  Biopsy results are pending  Objective:  Vitals:   07/27/17 2047 07/28/17 0543  BP: 112/71 105/72  Pulse: 94 80  Resp: 17 16  Temp: 98.3 F (36.8 C) 98 F (36.7 C)  SpO2: 95% 94%     Intake/Output Summary (Last 24 hours) at 07/28/2017 0805 Last data filed at 07/28/2017 0600 Gross per 24 hour  Intake 673.33 ml  Output -  Net 673.33 ml    GENERAL:alert, no distress and comfortable. She is thin SKIN: skin color, texture, turgor are normal, no rashes or significant lesions EYES: normal, Conjunctiva are pink and non-injected, sclera clear OROPHARYNX:no exudate, no erythema and lips, buccal mucosa, and tongue normal  NECK: supple, thyroid normal size, non-tender, without nodularity LYMPH:  no palpable lymphadenopathy in the cervical, axillary or inguinal LUNGS: Reduced breath sounds throughout HEART: regular rate & rhythm and no murmurs and no lower extremity edema ABDOMEN:abdomen soft Musculoskeletal:no cyanosis of digits and no clubbing  NEURO: alert & oriented x 3 with fluent speech, no focal motor/sensory deficits, mild tremor noted   Labs:  Lab Results  Component Value Date   WBC 8.7 07/28/2017   HGB 12.0 07/28/2017   HCT 38.6 07/28/2017   MCV 91.7 07/28/2017   PLT 281 07/28/2017   NEUTROABS 7.0 07/27/2017    Lab Results  Component Value Date   NA 138 07/28/2017  K 5.0 07/28/2017   CL 101 07/28/2017   CO2 30 07/28/2017    Studies:  Mr Jeri Cos SE Contrast  Result Date: 07/26/2017 CLINICAL DATA:  Non-small cell lung cancer, here for staging. History of type 1 Chiari malformation, stroke. EXAM: MRI  HEAD WITHOUT AND WITH CONTRAST TECHNIQUE: Multiplanar, multiecho pulse sequences of the brain and surrounding structures were obtained without and with intravenous contrast. CONTRAST:  75mL MULTIHANCE GADOBENATE DIMEGLUMINE 529 MG/ML IV SOLN COMPARISON:  MRI head May 29, 2008 FINDINGS: Sequences vary from moderate to severely motion degraded. INTRACRANIAL CONTENTS: No reduced diffusion to suggest acute ischemia or hypercellular tumor. No susceptibility artifact to suggest hemorrhage. Biparietal bilateral occipital lobe encephalomalacia. No overall parenchymal brain volume loss for age. A few scattered subcentimeter supratentorial white matter FLAIR T2 hyperintensities compatible with chronic small vessel ischemic changes. 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 No midline shift, mass effect or masses. No abnormal extra-axial fluid collections. Low-lying cerebellar tonsils difficult to quantify due to motion. VASCULAR: Normal major intracranial vascular flow voids present at skull base. SKULL AND UPPER CERVICAL SPINE: No abnormal sellar expansion. Heterogeneous bone marrow signal. Craniocervical junction maintained. SINUSES/ORBITS: The mastoid air-cells and included paranasal sinuses are well-aerated.The included ocular globes and orbital contents are non-suspicious. Status post bilateral ocular lens implants. OTHER: None. IMPRESSION: 1. Moderate to severely motion degraded examination. 2. No identified intracranial metastasis, however motion decreases sensitivity for small metastasis or leptomeningeal disease. If continued concern for metastatic disease, sedation may be warranted. 3. Nonspecific heterogeneous bone marrow signal, infiltrative tumor not excluded 4. 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 Electronically Signed   By: Elon Alas M.D.   On: 07/26/2017 19:35   US Biopsy (liver)  Result Date: 07/26/2017 INDICATION: Large right hilar mass,  innumerable liver metastases EXAM: ULTRASOUND GUIDED CORE BIOPSY OF RIGHT LIVER LESION MEDICATIONS: 1% lidocaine local ANESTHESIA/SEDATION: Versed 1.5mg  IV; Fentanyl 71mcg IV; Moderate Sedation Time:  10 minutes The patient was continuously monitored during the procedure by the interventional radiology nurse under my direct supervision. FLUOROSCOPY TIME:  Fluoroscopy Time: None. COMPLICATIONS: None immediate. PROCEDURE: The procedure, risks, benefits, and alternatives were explained to the patient. Questions regarding the procedure were encouraged and answered. The patient understands and consents to the procedure. Previous imaging reviewed. Preliminary ultrasound performed. A right hepatic lesion was demonstrated along the right subcostal margin adjacent to the gallbladder. Overlying skin marked. Under sterile conditions and local anesthesia, a 17 gauge 6.8 cm access needle was advanced to the lesion. Needle position confirmed with ultrasound. 18 gauge core biopsies obtained. Images obtained for documentation. No immediate complication. Patient tolerated the biopsy well. FINDINGS: Imaging confirms needle placed in the right hepatic lesion for core biopsy. IMPRESSION: Successful ultrasound right hepatic mass 18 gauge core biopsy Electronically Signed   By: Jerilynn Mages.  Shick M.D.   On: 07/26/2017 16:34

## 2017-07-28 NOTE — Progress Notes (Addendum)
Patient ID: Wanda James, female   DOB: 1955/07/27, 62 y.o.   MRN: [id number]                                                                PROGRESS NOTE                                                                                                                                                                                                             Patient Demographics:    Wanda James, is a 62 y.o. female, DOB - [birth year], JOI:[id number]  Admit date - 07/12/2017   Admitting Physician Jani Gravel, MD  Outpatient Primary MD for the patient is Althisar, Peggyann Juba  LOS - 5  Outpatient Specialists:     Chief Complaint  Patient presents with  . Chest Pain       Brief Narrative  62 y.o.female,w h/o renal cell carcinoma, nicotine dep, [place] home o2, apparently presents with c/oSharp chest pain of the xyphoid process starting last nite. + sob, wheezing. + cough, mostly dry. Denies fever, chills, palp, n/v, diarrhea, brbpr, dysuria, hematuria.   In Ed,  CT scan chest/ abd IMPRESSION: 1. Partial right lower lobe collapse/atelectasis with displacement of the right major fissure and caudal displacement of the right hilum. Differential considerations for this finding may be secondary to mucous impaction. A right infrahilar mass or endobronchial lesion causing the atelectasis are not entirely excluded. Mediastinal lymphadenopathy is also noted concerning for possible metastatic disease though reactive lymph nodes might also account for this. 2. Subtle hypodensity masslike abnormalities of the unenhanced liver worrisome for possible metastatic disease in light of the pulmonary findings. CT or MRI with IV contrast would be preferable for further assessment. 3. Left nephrectomy. Compensatory hypertrophy of the right kidney with nonobstructing interpolar calculus possibly vascular. 4. Partial left colectomy. 5. Moderate atherosclerosis of the abdominal aorta and branch vessels.   Trop  <0.03 Wbc 6.8, Hgb 14.5, Plt 304 Na 138, K 3.7, Bun 11, Creatinine 0.81 Ast 193, Alt 129, Alk phos 518, T. Bili 0.3 Lipase 99  Lactic acid 2.27  Pt will be admitted for chest pain, Copd exacerbation, Hcap, and abnormal liver function       Subjective:    Wanda James today has been afebrile.  Chest pain seems controlled  for now. Biopsy results not back yet.    No headache, No abdominal pain - No Nausea, No new weakness tingling or numbness,   Breathing is stable.    Assessment  & Plan :    Principal Problem:   Chest pain Active Problems:   COPD exacerbation (HCC)   HCAP (healthcare-associated pneumonia)   Cancer associated pain   Liver metastases (HCC)   Other constipation     Right hilar mass. Liver metastasis. Suspected to have pneumonia with a work-up was suggestive of for large rightlungmass. Pulmonary consulted and felt that the patient should get liver biopsy to help with staging as well. IR consulted and the biopsy  07/26/2017. Results pending If the biopsy is negative patient will require bronchoscopy and biopsy as well. Currently no significant wheezing appreciated. MRI brain 7/23 negative for metastatic disease Currently on decadrom 71m po bid Awaiting biopsy to see what optimum treatment is Oncology consulted, appreciate input   Postobstructive pneumonia. Suspected healthcare associated pneumonia. CT chest also suggested a possible postobstructive pneumonia versus atelectasis. Pulmonary consulted as well. Patient was on IV Solu-Medrol although I do not suspect that the patient does have any active COPD exacerbation and therefore will transition to oral prednisone.=>off MRSA PCR is negative therefore will discontinue vancomycin. Blood cultures so far negative as well. Continue cefepime for now and will likely transition her to oral Augmentin for the same. Monitor other recommendation from pulmonary. History of COPD continue home  inhalers. Radiation oncology consulted , appreciate input  Chest pain, Chronic lower back pain Anxiety. Tramadol=> ineffective Percocet => mildly effective Continue Dilaudid  MS contin 339mpo bid Currently significant anxious and in distress, PRN Ativan ordered. If continues to have significant anxiety and difficult control pain and will consult palliative care for input. Currently do not have a diagnosis and therefore do not have a plan of care regarding her cancer and therefore will hold off on palliative care consultation for goals of care.  History of renal cell carcinoma S/P left renal nephrectomy. Avoid nephrotoxic agent. Monitor.  Active smoker. More than 40 years of history. Nicotine patch.  Type 2 diabetes mellitus. Continue sliding scale insulin.  Severe protein calorie malnutrition Start prostat 3069mo bid (7/25)  Anxiety Start lexapo 5mg59m qday  Diet: carb modified diet DVT Prophylaxis:subcutaneous Heparin  Advance goals of care discussion:full code  Family Communication:family was present at bedside, at the time of interview. The pt provided permission to discuss medical plan with the family. Opportunity was given to ask question and all questions were answered satisfactorily.   Disposition: Discharge to be determined.  Consultants:PCCM, IR, oncology Procedures:Liver biopsy        RecentLabs       Lab Results  Component Value Date   PLT 290 07/27/2017      Antibiotics  :  vanco / cefepime 7/20, 7/21, cefepime 7/22=>      Anti-infectives (From admission, onward)   Start     Dose/Rate Route Frequency Ordered Stop   07/24/17 2200  vancomycin (VANCOCIN) IVPB 750 mg/150 ml premix  Status:  Discontinued     750 mg 150 mL/hr over 60 Minutes Intravenous Every 24 hours 07/18/2017 2334 07/25/17 1327   07/24/17 0600  ceFEPIme (MAXIPIME) 1 g in sodium chloride 0.9 % 100 mL IVPB     1 g 200 mL/hr over 30 Minutes  Intravenous Every 8 hours 07/26/2017 2322 08/01/17 0559   07/16/2017 2145  vancomycin (VANCOCIN) IVPB 1000 mg/200 mL premix  1,000 mg 200 mL/hr over 60 Minutes Intravenous  Once 07/14/2017 2143 07/29/2017 2353   07/22/2017 2145  ceFEPIme (MAXIPIME) 2 g in sodium chloride 0.9 % 100 mL IVPB     2 g 200 mL/hr over 30 Minutes Intravenous  Once 07/04/2017 2143 07/19/2017 2230        Objective:   Vitals:   07/27/17 1502 07/27/17 2003 07/27/17 2047 07/28/17 0543  BP: 104/67  112/71 105/72  Pulse: 93  94 80  Resp: _0 Temp: 98.1 F (36.7 C)  98.3 F (36.8 C) 98 F (36.7 C)  TempSrc: Oral  Oral Oral  SpO2: 91% 94% 95% 94%  Weight:      Height:        Wt Readings from Last 3 Encounters:  07/24/17 50.7 kg (111 lb 12.4 oz)  06/26/17 53.4 kg (117 lb 12.8 oz)  08/19/16 53.5 kg (118 lb)     Intake/Output Summary (Last 24 hours) at 07/28/2017 0741 Last data filed at 07/28/2017 0600 Gross per 24 hour  Intake 673.33 ml  Output -  Net 673.33 ml     Physical Exam  Awake Alert, Oriented X 3, No new F.N deficits, Normal affect [place].AT,PERRAL Supple Neck,No JVD, No cervical lymphadenopathy appriciated.  Symmetrical Chest wall movement, Good air movement bilaterally, slight crackles and decrease bs at right lung base RRR,No Gallops,Rubs or new Murmurs, No Parasternal Heave +ve B.Sounds, Abd Soft, No tenderness, No organomegaly appriciated, No rebound - guarding or rigidity. No Cyanosis, Clubbing or edema, No new Rash or bruise       Data Review:    CBC Recent Labs  Lab 07/31/2017 2025 07/24/2017 2026 07/24/17 0443 07/25/17 0445 07/27/17 0504 07/28/17 0434  WBC 6.8  --  5.9 8.6 7.6 8.7  HGB 14.5 15.6* 13.2 12.2 12.1 12.0  HCT 43.7 46.0 40.8 37.8 38.1 38.6  PLT 304  --  254 280 290 281  MCV 88.6  --  90.1 89.8 90.7 91.7  MCH 29.4  --  29.1 29.0 28.8 28.5  MCHC 33.2  --  32.4 32.3 31.8 31.1  RDW 14.7  --  15.0 15.4 16.0* 16.0*  LYMPHSABS 0.8  --   --   --  0.6*  --   MONOABS 0.8   --   --   --  0.0*  --   EOSABS 0.2  --   --   --  0.0  --   BASOSABS 0.0  --   --   --  0.0  --     Chemistries  Recent Labs  Lab 07/24/17 0443 07/25/17 0445 07/26/17 0446 07/27/17 0504 07/28/17 0434  NA 138 138 138 139 138  K 4.4 4.0 4.0 4.7 5.0  CL 104 104 102 103 101  CO2 _1 GLUCOSE 143* 129* 97 101* 114*  BUN _2 29*  CREATININE 0.74 0.64 0.59 0.54 0.63  CALCIUM 8.6* 8.5* 8.5* 8.5* 8.6*  MG  --  1.8  --  2.3  --   AST 168* 132* 125* 118* 121*  ALT 114* 105* 99* 94* 95*  ALKPHOS 479* 417* 447* 470* 466*  BILITOT 0.8 0.7 0.7 0.8 1.0   ------------------------------------------------------------------------------------------------------------------ No results for input(s): CHOL, HDL, LDLCALC, TRIG, CHOLHDL, LDLDIRECT in the last 72 hours.  Lab Results  Component Value Date   HGBA1C 6.8 (H) 06/28/2017   ------------------------------------------------------------------------------------------------------------------ No results for input(s): TSH, T4TOTAL, T3FREE, THYROIDAB in the last 72 hours.  Invalid input(s): FREET3 ------------------------------------------------------------------------------------------------------------------ No results for input(s): VITAMINB12, FOLATE, FERRITIN, TIBC, IRON, RETICCTPCT in the last 72 hours.  Coagulation profile Recent Labs  Lab 07/25/17 1124  INR 1.23    No results for input(s): DDIMER in the last 72 hours.  Cardiac Enzymes Recent Labs  Lab 07/05/2017 2359 07/24/17 0443 07/24/17 1116  TROPONINI <0.03 <0.03 <0.03   ------------------------------------------------------------------------------------------------------------------    Component Value Date/Time   BNP 25.0 06/26/2017 1506    Inpatient Medications  Scheduled Meds: . albuterol  2.5 mg Nebulization TID  . aspirin EC  325 mg Oral Daily  . dexamethasone  4 mg Oral Q12H  . enoxaparin (LOVENOX) injection  40 mg Subcutaneous Q24H  .  guaiFENesin  600 mg Oral BID  . lipase/protease/amylase  12,000 Units Oral TID AC  . mouth rinse  15 mL Mouth Rinse BID  . mometasone-formoterol  2 puff Inhalation BID  . morphine  30 mg Oral Q12H  . multivitamin  1 tablet Oral Daily  . nicotine  21 mg Transdermal Daily  . senna-docusate  1 tablet Oral BID  . tiotropium  18 mcg Inhalation Daily   Continuous Infusions: . ceFEPime (MAXIPIME) IV Stopped (07/28/17 0603)   PRN Meds:.albuterol, HYDROmorphone (DILAUDID) injection, LORazepam, morphine, ondansetron (ZOFRAN) IV, promethazine, zolpidem  Micro Results Recent Results (from the past 240 hour(s))  Culture, blood (routine x 2) Call MD if unable to obtain prior to antibiotics being given     Status: None (Preliminary result)   Collection Time: 07/19/2017 11:59 PM  Result Value Ref Range Status   Specimen Description   Final    BLOOD LEFT ANTECUBITAL Performed at [facility] [address], [place], [place] [address]    Special Requests   Final    BOTTLES DRAWN AEROBIC AND ANAEROBIC Blood Culture results may not be optimal due to an excessive volume of blood received in culture bottles Performed at [facility] [address], [place], [place] [address]    Culture   Final    NO GROWTH 3 DAYS Performed at [facility], [place] [address] [place], [place] [address]    Report Status PENDING  Incomplete  Culture, blood (routine x 2) Call MD if unable to obtain prior to antibiotics being given     Status: None (Preliminary result)   Collection Time: 07/25/17  4:45 AM  Result Value Ref Range Status   Specimen Description   Final    BLOOD RIGHT ANTECUBITAL Performed at [facility] [address] [place], [place] [address]    Special Requests   Final    BOTTLES DRAWN AEROBIC ONLY Blood Culture adequate volume Performed at [facility] [address], [place], [place] [address]    Culture   Final     NO GROWTH 2 DAYS Performed at [facility] [address], [place], [place] [address]    Report Status PENDING  Incomplete  MRSA PCR Screening     Status: None   Collection Time: 07/25/17  8:04 AM  Result Value Ref Range Status   MRSA by PCR NEGATIVE NEGATIVE Final    Comment:        The GeneXpert MRSA Assay (FDA approved for NASAL specimens only), is one component of a comprehensive MRSA colonization surveillance program. It is not intended to diagnose MRSA infection nor to guide or monitor treatment for MRSA infections. Performed at [facility], [place] [address] [place],  [place] [address]     Radiology Reports Ct Abdomen Pelvis Wo Contrast  Result Date: 07/26/2017 CLINICAL DATA:  Mid chest and back pain. Patient was recently treated for pneumonia and has L4 and L5 fractures coughing. EXAM: CT CHEST, ABDOMEN AND PELVIS WITHOUT CONTRAST TECHNIQUE: Multidetector CT imaging of the chest, abdomen and pelvis was performed following the standard protocol without IV contrast. COMPARISON:  CXR from the same day, 06/29/2017 CXR and 01/31/2015 CT AP FINDINGS: CT CHEST FINDINGS Cardiovascular: Conventional branch pattern of the great vessels with atherosclerosis of the proximal great vessels. Mild atherosclerosis of the nonaneurysmal thoracic aorta measuring up to 3.3 cm along the ascending portion. Nondilated main pulmonary artery. Heart size is normal without pericardial effusion. Minimal coronary arteriosclerosis along the distal LAD. Mediastinum/Nodes: Mediastinal adenopathy is identified with largest lymph node right lower paratracheal measuring 2.2 cm in short axis. A 1.1 cm AP window lymph node is also noted. The trachea and mainstem bronchi are patent. Occlusion of the right lower lobe bronchus and branch vessels is noted. Lungs/Pleura: There is a confluent area of partially atelectatic/partially collapsed right lower lobe with displacement of the major fissure and  inferior displacement of the right hilum from volume loss. This is likely related to obstruction of right lower lobe pulmonary bronchi possibly from mucus inspissation given somewhat tubular hypodensity new noted within the visualized bronchi. Alternatively an endobronchial lesion is not excluded or unlikely an extrinsic infrahilar mass causing extrinsic impression given the subtle tubular soft tissue opacities noted within the expected bronchi. Musculoskeletal: Superior endplate Schmorl's node of T10. No aggressive osseous lesions. CT ABDOMEN PELVIS FINDINGS Hepatobiliary: Inhomogeneous masslike hypodensities are noted of the liver concerning for liver lesions possibly metastatic given findings in the chest. No biliary dilatation is identified. The gallbladder is unremarkable. Pancreas: Normal Spleen: Normal size spleen without focal mass. Adrenals/Urinary Tract: Status post left nephrectomy. Right adrenal gland and kidney are nonacute with compensatory hypertrophy of the right kidney. Punctate renal pelvic calcifications seen on the right possibly vascular or a nonobstructing calculus. Left adrenal gland is not well visualized and may be surgically absent as well. Nodularity in the expected location of the adrenal gland is felt to represent partial volume averaging of the tail of the pancreas. Stomach/Bowel: The stomach is decompressed in appearance. Normal small bowel rotation without obstruction or inflammation. The distal and terminal ileum are normal. Normal appearing appendix is noted. Increased stool burden within the colon compatible constipation. No large bowel inflammation. Partial colectomy on the left with sutures noted in the region of the rectosigmoid. Vascular/Lymphatic: Moderate to marked atherosclerosis of the abdominal aorta and branch vessels. Small retroperitoneal lymph nodes are identified without pathologic enlargement. Reproductive: Hysterectomy.  No adnexal mass. Other: No free air nor free  fluid. Musculoskeletal: Degenerative disc disease L1 through L4. No aggressive osseous lesions identified. IMPRESSION: 1. Partial right lower lobe collapse/atelectasis with displacement of the right major fissure and caudal displacement of the right hilum. Differential considerations for this finding may be secondary to mucous impaction. A right infrahilar mass or endobronchial lesion causing the atelectasis are not entirely excluded. Mediastinal lymphadenopathy is also noted concerning for possible metastatic disease though reactive lymph nodes might also account for this. 2. Subtle hypodensity masslike abnormalities of the unenhanced liver worrisome for possible metastatic disease in light of the pulmonary findings. CT or MRI with IV contrast would be preferable for further assessment. 3. Left nephrectomy. Compensatory hypertrophy of the right kidney with nonobstructing interpolar calculus possibly vascular. 4. Partial left  colectomy. 5. Moderate atherosclerosis of the abdominal aorta and branch vessels. Electronically Signed   By: Ashley Royalty M.D.   On: 07/24/2017 21:33   Dg Chest 2 View  Result Date: 07/15/2017 CLINICAL DATA:  Chest pain EXAM: CHEST - 2 VIEW COMPARISON:  June 29, 2017 FINDINGS: There is extensive right lower lobe airspace consolidation consistent with pneumonia. Lungs elsewhere clear. Heart size and pulmonary vascularity are normal. No adenopathy. There is an old healed fracture of the lateral right sixth rib. There is aortic atherosclerosis. IMPRESSION: Extensive consolidation right lower lobe consistent with pneumonia. Lungs elsewhere clear. Heart size normal. There is aortic atherosclerosis. Aortic Atherosclerosis (ICD10-I70.0). Electronically Signed   By: Lowella Grip III M.D.   On: 07/30/2017 20:57   Ct Chest Wo Contrast  Result Date: 07/26/2017 CLINICAL DATA:  Mid chest and back pain. Patient was recently treated for pneumonia and has L4 and L5 fractures coughing. EXAM: CT  CHEST, ABDOMEN AND PELVIS WITHOUT CONTRAST TECHNIQUE: Multidetector CT imaging of the chest, abdomen and pelvis was performed following the standard protocol without IV contrast. COMPARISON:  CXR from the same day, 06/29/2017 CXR and 01/31/2015 CT AP FINDINGS: CT CHEST FINDINGS Cardiovascular: Conventional branch pattern of the great vessels with atherosclerosis of the proximal great vessels. Mild atherosclerosis of the nonaneurysmal thoracic aorta measuring up to 3.3 cm along the ascending portion. Nondilated main pulmonary artery. Heart size is normal without pericardial effusion. Minimal coronary arteriosclerosis along the distal LAD. Mediastinum/Nodes: Mediastinal adenopathy is identified with largest lymph node right lower paratracheal measuring 2.2 cm in short axis. A 1.1 cm AP window lymph node is also noted. The trachea and mainstem bronchi are patent. Occlusion of the right lower lobe bronchus and branch vessels is noted. Lungs/Pleura: There is a confluent area of partially atelectatic/partially collapsed right lower lobe with displacement of the major fissure and inferior displacement of the right hilum from volume loss. This is likely related to obstruction of right lower lobe pulmonary bronchi possibly from mucus inspissation given somewhat tubular hypodensity new noted within the visualized bronchi. Alternatively an endobronchial lesion is not excluded or unlikely an extrinsic infrahilar mass causing extrinsic impression given the subtle tubular soft tissue opacities noted within the expected bronchi. Musculoskeletal: Superior endplate Schmorl's node of T10. No aggressive osseous lesions. CT ABDOMEN PELVIS FINDINGS Hepatobiliary: Inhomogeneous masslike hypodensities are noted of the liver concerning for liver lesions possibly metastatic given findings in the chest. No biliary dilatation is identified. The gallbladder is unremarkable. Pancreas: Normal Spleen: Normal size spleen without focal mass.  Adrenals/Urinary Tract: Status post left nephrectomy. Right adrenal gland and kidney are nonacute with compensatory hypertrophy of the right kidney. Punctate renal pelvic calcifications seen on the right possibly vascular or a nonobstructing calculus. Left adrenal gland is not well visualized and may be surgically absent as well. Nodularity in the expected location of the adrenal gland is felt to represent partial volume averaging of the tail of the pancreas. Stomach/Bowel: The stomach is decompressed in appearance. Normal small bowel rotation without obstruction or inflammation. The distal and terminal ileum are normal. Normal appearing appendix is noted. Increased stool burden within the colon compatible constipation. No large bowel inflammation. Partial colectomy on the left with sutures noted in the region of the rectosigmoid. Vascular/Lymphatic: Moderate to marked atherosclerosis of the abdominal aorta and branch vessels. Small retroperitoneal lymph nodes are identified without pathologic enlargement. Reproductive: Hysterectomy.  No adnexal mass. Other: No free air nor free fluid. Musculoskeletal: Degenerative disc disease L1 through L4.  No aggressive osseous lesions identified. IMPRESSION: 1. Partial right lower lobe collapse/atelectasis with displacement of the right major fissure and caudal displacement of the right hilum. Differential considerations for this finding may be secondary to mucous impaction. A right infrahilar mass or endobronchial lesion causing the atelectasis are not entirely excluded. Mediastinal lymphadenopathy is also noted concerning for possible metastatic disease though reactive lymph nodes might also account for this. 2. Subtle hypodensity masslike abnormalities of the unenhanced liver worrisome for possible metastatic disease in light of the pulmonary findings. CT or MRI with IV contrast would be preferable for further assessment. 3. Left nephrectomy. Compensatory hypertrophy of the  right kidney with nonobstructing interpolar calculus possibly vascular. 4. Partial left colectomy. 5. Moderate atherosclerosis of the abdominal aorta and branch vessels. Electronically Signed   By: Ashley Royalty M.D.   On: 07/26/2017 21:33   Mr Jeri Cos QV Contrast  Result Date: 07/26/2017 CLINICAL DATA:  Non-small cell lung cancer, here for staging. History of type 1 Chiari malformation, stroke. EXAM: MRI HEAD WITHOUT AND WITH CONTRAST TECHNIQUE: Multiplanar, multiecho pulse sequences of the brain and surrounding structures were obtained without and with intravenous contrast. CONTRAST:  55m MULTIHANCE GADOBENATE DIMEGLUMINE 529 MG/ML IV SOLN COMPARISON:  MRI head May 29, 2008 FINDINGS: Sequences vary from moderate to severely motion degraded. INTRACRANIAL CONTENTS: No reduced diffusion to suggest acute ischemia or hypercellular tumor. No susceptibility artifact to suggest hemorrhage. Biparietal bilateral occipital lobe encephalomalacia. No overall parenchymal brain volume loss for age. A few scattered subcentimeter supratentorial white matter FLAIR T2 hyperintensities compatible with chronic small vessel ischemic changes. 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 No midline shift, mass effect or masses. No abnormal extra-axial fluid collections. Low-lying cerebellar tonsils difficult to quantify due to motion. VASCULAR: Normal major intracranial vascular flow voids present at skull base. SKULL AND UPPER CERVICAL SPINE: No abnormal sellar expansion. Heterogeneous bone marrow signal. Craniocervical junction maintained. SINUSES/ORBITS: The mastoid air-cells and included paranasal sinuses are well-aerated.The included ocular globes and orbital contents are non-suspicious. Status post bilateral ocular lens implants. OTHER: None. IMPRESSION: 1. Moderate to severely motion degraded examination. 2. No identified intracranial metastasis, however motion decreases sensitivity for small metastasis or leptomeningeal  disease. If continued concern for metastatic disease, sedation may be warranted. 3. Nonspecific heterogeneous bone marrow signal, infiltrative tumor not excluded 4. 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 Electronically Signed   By: CElon AlasM.D.   On: 07/26/2017 19:35   Mr Abdomen W Wo Contrast  Result Date: 07/24/2017 CLINICAL DATA:  LEFT lower lobe collapse difficulty breathing. Indeterminate hepatic lesions identified on noncontrast CT. History of renal cell carcinoma. LEFT nephrectomy EXAM: MRI ABDOMEN WITHOUT AND WITH CONTRAST TECHNIQUE: Multiplanar multisequence MR imaging of the abdomen was performed both before and after the administration of intravenous contrast. CONTRAST:  127mMULTIHANCE GADOBENATE DIMEGLUMINE 529 MG/ML IV SOLN COMPARISON:  CT 07/17/2017 FINDINGS: Lower chest: A complete collapse of the RIGHT lower lobe with obstruction of the RIGHT lower lobe bronchus. On coronal imaging there is masslike lesion surrounding the bronchus intermedius measuring 6.4 by 4.1 cm (image 16/6 per Hepatobiliary: Too numerous to count round enhancing lesions extensively involving the LEFT RIGHT hepatic lobe. Lesions range in size from 10 mm to 33 mm. Approximately 100 lesions. Largest lesion occupies the LEFT hepatic lobe measures 6.4 cm large example lesion in the RIGHT hepatic lobe measures 3.6 cm in the dome (image 27/9 0 point. There is no biliary duct dilatation. The gallbladder and  common bile duct are normal. Pancreas: Pancreas is normal. No ductal dilatation. No pancreatic inflammation. Spleen: Normal spleen Adrenals/urinary tract: Adrenal glands are normal. Post LEFT nephrectomy. Compensatory hypertrophy of the RIGHT kidney. No RIGHT renal lesion. Stomach/Bowel: Stomach and limited view of the bowel is unremarkable. Vascular/Lymphatic: Normal abdominal aorta. No significant adenopathy Reproductive: Other: No free fluid. Musculoskeletal: No  aggressive osseous lesion. IMPRESSION: 1. Large RIGHT hilar mass surrounds the bronchus intermedius and extends into the mediastinum consistent with primary malignancy versus metastatic lesion. This lesion obstructs the RIGHT lower bronchus with complete atelectasis of the RIGHT lower lobe. 2. 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 Lesions reached near confluence and and number approximately 100. 3. LEFT nephrectomy.  Normal hypertrophy RIGHT kidney. 4. No bony metastasis identified Electronically Signed   By: Suzy Bouchard M.D.   On: 07/24/2017 15:28   US Biopsy (liver)  Result Date: 07/26/2017 INDICATION: Large right hilar mass, innumerable liver metastases EXAM: ULTRASOUND GUIDED CORE BIOPSY OF RIGHT LIVER LESION MEDICATIONS: 1% lidocaine local ANESTHESIA/SEDATION: Versed 1.65m IV; Fentanyl 52m IV; Moderate Sedation Time:  10 minutes The patient was continuously monitored during the procedure by the interventional radiology nurse under my direct supervision. FLUOROSCOPY TIME:  Fluoroscopy Time: None. COMPLICATIONS: None immediate. PROCEDURE: The procedure, risks, benefits, and alternatives were explained to the patient. Questions regarding the procedure were encouraged and answered. The patient understands and consents to the procedure. Previous imaging reviewed. Preliminary ultrasound performed. A right hepatic lesion was demonstrated along the right subcostal margin adjacent to the gallbladder. Overlying skin marked. Under sterile conditions and local anesthesia, a 17 gauge 6.8 cm access needle was advanced to the lesion. Needle position confirmed with ultrasound. 18 gauge core biopsies obtained. Images obtained for documentation. No immediate complication. Patient tolerated the biopsy well. FINDINGS: Imaging confirms needle placed in the right hepatic lesion for core biopsy. IMPRESSION: Successful ultrasound right hepatic mass 18 gauge core biopsy Electronically Signed   By: M.Jerilynn Mages  Shick M.D.   On: 07/26/2017 16:34   Dg Chest Port 1 View  Result Date: 06/29/2017 CLINICAL DATA:  Short of breath EXAM: PORTABLE CHEST 1 VIEW COMPARISON:  06/28/2017 FINDINGS: Normal heart size. Hyperaeration. Stable airspace disease at the right base. Persistent volume loss in the right lung with shift of the mediastinum to the right. No pneumothorax. No definite pleural effusion. IMPRESSION: Stable airspace disease at the right lung base with volume loss. Electronically Signed   By: ArMarybelle Killings.D.   On: 06/29/2017 06:59    Time Spent in minutes  30   JaJani Gravel.D on 07/28/2017 at 7:41 AM  Between 7am to 7pm - Pager - [phone]  After 7pm go to [email or web address] - password [facility] -  Office  [phone]

## 2017-07-28 NOTE — Progress Notes (Addendum)
[facility] PCCM  S: Feels like her pain is better controlled. States her breathing is better.  O:  Vitals:   07/27/17 2047 07/28/17 0543 07/28/17 0816 07/28/17 0818  BP: 112/71 105/72    Pulse: 94 80    Resp: 17 16    Temp: 98.3 F (36.8 C) 98 F (36.7 C)    TempSrc: Oral Oral    SpO2: 95% 94% 94% 94%  Weight:      Height:       2L [place], Saturations are 94%, RR 16  General:  Resting comfortably in bed, awake and alert HENT: NCAT OP clear, No LAD PULM: Slightly diminished per bases, normal effort, no accessory muscle use, NAD CV: S1, S2, RRR, no mgr GI: BS+, soft, non tender, ND, BS + MSK: normal bulk and tone, no obvious deformities Neuro: awake, alert and oriented x 3, no distress, MAE x 4  CT chest images : large R LL mass, mediastinal adenopathy, RLL consolidation MRI abdomen images : innumerable lesions in the liver  Impression/plan:  There is extensive right lower lobe airspace consolidation consistent with pneumonia>> continue antibiotics per primary team.  Lung mass, smoker with mediastinal lymphadenopathy and likely metastatic lesions in liver.Highly suspicious for possible new primary lung cancer with widespread metastatic disease to the liver.MRI negative for intracranial mets  Liver biopsy done 07/26/2017 by IR for staging purposes.>> pathology is pending, but note per radiation oncology,preliminary diagnosis of cancer from her liver biopsy. IHC is pending but could be small cell carcinoma.   . Radiation oncologist has been consulted; palliative radiation treatment to start today, planned 10 treatments. Elevated LFT's may exclude choices of chemotherapy.   Pt. Is for discharge tomorrow 7/26 if her pain is stable. She has follow up with oncology Monday 08/01/2017. She has radiation treatments scheduled. She has been scheduled for hospital  follow up with Pulmonary  August 8 th at 11 am with Wyn Quaker, NP.   Magdalen Spatz, AGACNP-BC [place] PCCM Pager:  [phone]  07/28/2017 11:19 AM

## 2017-07-29 ENCOUNTER — Encounter (HOSPITAL_COMMUNITY): Payer: Self-pay | Admitting: Radiology

## 2017-07-29 ENCOUNTER — Institutional Professional Consult (permissible substitution): Payer: BLUE CROSS/BLUE SHIELD | Admitting: Pulmonary Disease

## 2017-07-29 ENCOUNTER — Inpatient Hospital Stay (HOSPITAL_COMMUNITY): Payer: BLUE CROSS/BLUE SHIELD

## 2017-07-29 ENCOUNTER — Ambulatory Visit: Payer: BLUE CROSS/BLUE SHIELD

## 2017-07-29 ENCOUNTER — Telehealth: Payer: Self-pay | Admitting: Hematology and Oncology

## 2017-07-29 DIAGNOSIS — R112 Nausea with vomiting, unspecified: Secondary | ICD-10-CM

## 2017-07-29 DIAGNOSIS — F17200 Nicotine dependence, unspecified, uncomplicated: Secondary | ICD-10-CM

## 2017-07-29 LAB — COMPREHENSIVE METABOLIC PANEL
ALT: 104 U/L — ABNORMAL HIGH (ref 0–44)
AST: 127 U/L — AB (ref 15–41)
Albumin: 2.8 g/dL — ABNORMAL LOW (ref 3.5–5.0)
Alkaline Phosphatase: 528 U/L — ABNORMAL HIGH (ref 38–126)
Anion gap: 9 (ref 5–15)
BUN: 41 mg/dL — ABNORMAL HIGH (ref 8–23)
CALCIUM: 8.8 mg/dL — AB (ref 8.9–10.3)
CHLORIDE: 100 mmol/L (ref 98–111)
CO2: 26 mmol/L (ref 22–32)
CREATININE: 0.71 mg/dL (ref 0.44–1.00)
GFR calc non Af Amer: >60 mL/min (ref >60)
GLUCOSE: 77 mg/dL (ref 70–99)
POTASSIUM: 5.2 mmol/L — AB (ref 3.5–5.1)
Sodium: 135 mmol/L (ref 135–145)
TOTAL PROTEIN: 6.2 g/dL — AB (ref 6.5–8.1)
Total Bilirubin: 1.3 mg/dL — ABNORMAL HIGH (ref 0.3–1.2)

## 2017-07-29 LAB — CBC
HCT: 41.6 % (ref 36.0–46.0)
HEMATOCRIT: 38.1 % (ref 36.0–46.0)
HEMOGLOBIN: 11.8 g/dL — AB (ref 12.0–15.0)
Hemoglobin: 13 g/dL (ref 12.0–15.0)
MCH: 29 pg (ref 26.0–34.0)
MCH: 28.9 pg (ref 26.0–34.0)
MCHC: 31.3 g/dL (ref 30.0–36.0)
MCHC: 31 g/dL (ref 30.0–36.0)
MCV: 92.7 fL (ref 78.0–100.0)
MCV: 93.2 fL (ref 78.0–100.0)
PLATELETS: 309 10*3/uL (ref 150–400)
Platelets: 275 10*3/uL (ref 150–400)
RBC: 4.49 MIL/uL (ref 3.87–5.11)
RBC: 4.09 MIL/uL (ref 3.87–5.11)
RDW: 16 % — AB (ref 11.5–15.5)
RDW: 15.8 % — AB (ref 11.5–15.5)
WBC: 13.8 10*3/uL — AB (ref 4.0–10.5)
WBC: 11.8 10*3/uL — ABNORMAL HIGH (ref 4.0–10.5)

## 2017-07-29 LAB — BLOOD GAS, ARTERIAL
ACID-BASE EXCESS: 0.2 mmol/L (ref 0.0–2.0)
Bicarbonate: 25.1 mmol/L (ref 20.0–28.0)
Drawn by: 270211
O2 CONTENT: 3 L/min
O2 SAT: 98.4 %
PATIENT TEMPERATURE: 98.6
PO2 ART: 146 mmHg — AB (ref 83.0–108.0)
pCO2 arterial: 43.7 mmHg (ref 32.0–48.0)
pH, Arterial: 7.377 (ref 7.350–7.450)

## 2017-07-29 LAB — CULTURE, BLOOD (ROUTINE X 2): CULTURE: NO GROWTH

## 2017-07-29 LAB — OCCULT BLOOD X 1 CARD TO LAB, STOOL: Fecal Occult Bld: POSITIVE — AB

## 2017-07-29 LAB — AMMONIA: Ammonia: 97 umol/L — ABNORMAL HIGH (ref 9–35)

## 2017-07-29 LAB — TROPONIN I: Troponin I: <0.03 ng/mL (ref <0.03)

## 2017-07-29 MED ORDER — FAMOTIDINE IN NACL 20-0.9 MG/50ML-% IV SOLN
20.0000 mg | Freq: Two times a day (BID) | INTRAVENOUS | Status: DC
  Administered 2017-07-29 – 2017-08-01 (×8): 20 mg via INTRAVENOUS
  Filled 2017-07-29 (×8): qty 50

## 2017-07-29 MED ORDER — LACTULOSE 10 GM/15ML PO SOLN
30.0000 g | Freq: Once | ORAL | Status: AC
  Administered 2017-07-29: 30 g via ORAL
  Filled 2017-07-29: qty 60

## 2017-07-29 MED ORDER — HYDROMORPHONE HCL 1 MG/ML IJ SOLN
1.0000 mg | Freq: Once | INTRAMUSCULAR | Status: AC
  Administered 2017-07-29: 1 mg via INTRAVENOUS
  Filled 2017-07-29: qty 1

## 2017-07-29 MED ORDER — LACTULOSE ENEMA
300.0000 mL | Freq: Once | ORAL | Status: DC
  Filled 2017-07-29: qty 300

## 2017-07-29 NOTE — Progress Notes (Signed)
Received report from S. Retia Passe, Therapist, sports. No change from initial pm assessment. Will continue to monitor and follow the POC.

## 2017-07-29 NOTE — Progress Notes (Signed)
Informed Dr. Maudie Mercury about patient throwing up X3 and the black emesis. Also 2 black stools very loose in nature. Hemocult was positive.  Family in the room and aware of patient condition. Gave PRN medication. Will continue to monitor.

## 2017-07-29 NOTE — Progress Notes (Signed)
Patient ID: [name], female   DOB: 19-Jul-1955, 62 y.o.   MRN: [id number]                                                                PROGRESS NOTE                                                                                                                                                                                                             Patient Demographics:    Wanda James, is a 62 y.o. female, DOB - 12-Mar-1955, FUX:[id number]  Admit date - 07/16/2017   Admitting Physician Jani Gravel, MD  Outpatient Primary MD for the patient is Althisar, Peggyann Juba  LOS - 6  Outpatient Specialists:     Chief Complaint  Patient presents with  . Chest Pain       Brief Narrative   62 y.o.female,w h/o renal cell carcinoma, nicotine dep, [place] home o2, apparently presents with c/oSharp chest pain of the xyphoid process starting last nite. + sob, wheezing. + cough, mostly dry. Denies fever, chills, palp, n/v, diarrhea, brbpr, dysuria, hematuria.   In Ed,  CT scan chest/ abd IMPRESSION: 1. Partial right lower lobe collapse/atelectasis with displacement of the right major fissure and caudal displacement of the right hilum. Differential considerations for this finding may be secondary to mucous impaction. A right infrahilar mass or endobronchial lesion causing the atelectasis are not entirely excluded. Mediastinal lymphadenopathy is also noted concerning for possible metastatic disease though reactive lymph nodes might also account for this. 2. Subtle hypodensity masslike abnormalities of the unenhanced liver worrisome for possible metastatic disease in light of the pulmonary findings. CT or MRI with IV contrast would be preferable for further assessment. 3. Left nephrectomy. Compensatory hypertrophy of the right kidney with nonobstructing interpolar calculus possibly vascular. 4. Partial left colectomy. 5. Moderate atherosclerosis of the abdominal aorta and branch  vessels.   Trop <0.03 Wbc 6.8, Hgb 14.5, Plt 304 Na 138, K 3.7, Bun 11, Creatinine 0.81 Ast 193, Alt 129, Alk phos 518, T. Bili 0.3 Lipase 99  Lactic acid 2.27  Pt will be admitted for chest pain, Copd exacerbation, Hcap, and abnormal liver function         Subjective:    Wanda James today has altered mental status.  Pt seems very somnolent.  ? Pain medication.  When I assessed her at 8am this morning.  Her mental status has improved but now she has nausea and vommitting. Afebrile.   No headache, No chest pain, No abdominal pain - No new weakness tingling or numbness, No Cough - SOB.    Assessment  & Plan :    Principal Problem:   Chest pain Active Problems:   COPD exacerbation (HCC)   HCAP (healthcare-associated pneumonia)   Cancer associated pain   Liver metastases (HCC)   Other constipation    AMS Holding pain medication Check CT brain => no acute process Check ABG Check ammonia level  Lactulose 30gm po x1 given Check ammonia level in AM    N/v Xray Abdomen 2 view hemeoccult Cont phenergan Cont zofran Start pepcid '20mg'$  iv bid  Right hilar mass. Liver metastasis. Suspected to have pneumonia with a work-up was suggestive of for large rightlungmass. Pulmonary consulted and felt that the patient should get liver biopsy to help with staging as well. IR consulted and the biopsy 07/26/2017. Results pending If the biopsy is negative patient will require bronchoscopy and biopsy as well. Currently no significant wheezing appreciated. MRI brain 7/23 negative for metastatic disease Currently on decadrom '4mg'$  po bid Awaiting biopsy to see what optimum treatment is Oncology consulted, appreciate input   Postobstructive pneumonia. Suspected healthcare associated pneumonia. CT chest also suggested a possible postobstructive pneumonia versus atelectasis. Pulmonary consulted as well. Patient was on IV Solu-Medrol although I do not suspect that the  patient does have any active COPD exacerbation and therefore will transition to oral prednisone.=>off MRSA PCR is negative therefore will discontinue vancomycin. Blood cultures so far negative as well. Continue cefepime for now and will likely transition her to oral Augmentin for the same. Monitor other recommendation from pulmonary. History of COPD continue home inhalers. Radiation oncology consulted , appreciate input  Chest pain, Chronic lower back pain Anxiety. Tramadol=> ineffective Percocet => mildly effective Continue Dilaudid  MS contin '30mg'$  po bid Currently significant anxious and in distress, PRN Ativan ordered. If continues to have significant anxiety and difficult control pain and will consult palliative care for input. Currently do not have a diagnosis and therefore do not have a plan of care regarding her cancer and therefore will hold off on palliative care consultation for goals of care.  History of renal cell carcinoma S/P left renal nephrectomy. Avoid nephrotoxic agent. Monitor.  Active smoker. More than 40 years of history. Nicotine patch.  Type 2 diabetes mellitus. Continue sliding scale insulin.  Severe protein calorie malnutrition Start prostat 37m po bid (7/25)  Anxiety Start lexapo '5mg'$  po qday (7/25)  Diet: carb modified diet DVT Prophylaxis:subcutaneous Heparin  Advance goals of care discussion:full code  Family Communication:family was present at bedside, at the time of interview. The pt provided permission to discuss medical plan with the family. Opportunity was given to ask question and all questions were answered satisfactorily.   Disposition: Discharge to be determined.  Consultants:PCCM, IR, oncology Procedures:Liver biopsy         Anti-infectives (From admission, onward)   Start     Dose/Rate Route Frequency Ordered Stop   07/24/17 2200  vancomycin (VANCOCIN) IVPB 750 mg/150 ml premix  Status:  Discontinued      750 mg 150 mL/hr over 60 Minutes Intravenous Every 24 hours 07/26/2017 2334 07/25/17 1327   07/24/17 0600  ceFEPIme (MAXIPIME) 1 g in sodium chloride 0.9 % 100 mL IVPB  1 g 200 mL/hr over 30 Minutes Intravenous Every 8 hours 07/17/2017 2322 08/01/17 0559   07/29/2017 2145  vancomycin (VANCOCIN) IVPB 1000 mg/200 mL premix     1,000 mg 200 mL/hr over 60 Minutes Intravenous  Once 07/25/2017 2143 07/11/2017 2353   07/08/2017 2145  ceFEPIme (MAXIPIME) 2 g in sodium chloride 0.9 % 100 mL IVPB     2 g 200 mL/hr over 30 Minutes Intravenous  Once 07/25/2017 2143 07/11/2017 2230        Objective:   Vitals:   07/28/17 1946 07/28/17 2140 07/29/17 0423 07/29/17 1033  BP:  114/74 102/78   Pulse:  88 88   Resp:  20 14   Temp:  98.4 F (36.9 C) 98.4 F (36.9 C)   TempSrc:  Oral Oral   SpO2: 94% 94% 95% 97%  Weight:      Height:        Wt Readings from Last 3 Encounters:  07/24/17 50.7 kg (111 lb 12.4 oz)  06/26/17 53.4 kg (117 lb 12.8 oz)  08/19/16 53.5 kg (118 lb)     Intake/Output Summary (Last 24 hours) at 07/29/2017 1257 Last data filed at 07/29/2017 0200 Gross per 24 hour  Intake 360 ml  Output -  Net 360 ml     Physical Exam  Awake Alert, Oriented X 3, No new F.N deficits, Normal affect [place].AT,PERRAL Supple Neck,No JVD, No cervical lymphadenopathy appriciated.  Symmetrical Chest wall movement, Good air movement bilaterally, CTAB RRR,No Gallops,Rubs or new Murmurs, No Parasternal Heave +ve B.Sounds, Abd Soft, No tenderness, No organomegaly appriciated, No rebound - guarding or rigidity. No Cyanosis, Clubbing or edema, No new Rash or bruise      Data Review:    CBC Recent Labs  Lab 07/08/2017 2025  07/24/17 0443 07/25/17 0445 07/27/17 0504 07/28/17 0434 07/29/17 0433  WBC 6.8  --  5.9 8.6 7.6 8.7 13.8*  HGB 14.5   < > 13.2 12.2 12.1 12.0 13.0  HCT 43.7   < > 40.8 37.8 38.1 38.6 41.6  PLT 304  --  254 280 290 281 309  MCV 88.6  --  90.1 89.8 90.7 91.7 92.7  MCH 29.4  --   29.1 29.0 28.8 28.5 29.0  MCHC 33.2  --  32.4 32.3 31.8 31.1 31.3  RDW 14.7  --  15.0 15.4 16.0* 16.0* 16.0*  LYMPHSABS 0.8  --   --   --  0.6*  --   --   MONOABS 0.8  --   --   --  0.0*  --   --   EOSABS 0.2  --   --   --  0.0  --   --   BASOSABS 0.0  --   --   --  0.0  --   --    < > = values in this interval not displayed.    Chemistries  Recent Labs  Lab 07/25/17 0445 07/26/17 0446 07/27/17 0504 07/28/17 0434 07/29/17 0433  NA 138 138 139 138 135  K 4.0 4.0 4.7 5.0 5.2*  CL 104 102 103 101 100  CO2 '24 26 29 30 26  '$ GLUCOSE 129* 97 101* 114* 77  BUN '14 22 23 '$ 29* 41*  CREATININE 0.64 0.59 0.54 0.63 0.71  CALCIUM 8.5* 8.5* 8.5* 8.6* 8.8*  MG 1.8  --  2.3  --   --   AST 132* 125* 118* 121* 127*  ALT 105* 99* 94* 95* 104*  ALKPHOS 417* 447*  470* 466* 528*  BILITOT 0.7 0.7 0.8 1.0 1.3*   ------------------------------------------------------------------------------------------------------------------ No results for input(s): CHOL, HDL, LDLCALC, TRIG, CHOLHDL, LDLDIRECT in the last 72 hours.  Lab Results  Component Value Date   HGBA1C 6.8 (H) 06/28/2017   ------------------------------------------------------------------------------------------------------------------ No results for input(s): TSH, T4TOTAL, T3FREE, THYROIDAB in the last 72 hours.  Invalid input(s): FREET3 ------------------------------------------------------------------------------------------------------------------ No results for input(s): VITAMINB12, FOLATE, FERRITIN, TIBC, IRON, RETICCTPCT in the last 72 hours.  Coagulation profile Recent Labs  Lab 07/25/17 1124  INR 1.23    No results for input(s): DDIMER in the last 72 hours.  Cardiac Enzymes Recent Labs  Lab 07/04/2017 2359 07/24/17 0443 07/24/17 1116  TROPONINI <0.03 <0.03 <0.03   ------------------------------------------------------------------------------------------------------------------    Component Value Date/Time   BNP 25.0  06/26/2017 1506    Inpatient Medications  Scheduled Meds: . albuterol  2.5 mg Nebulization TID  . aspirin EC  325 mg Oral Daily  . dexamethasone  4 mg Oral Daily  . enoxaparin (LOVENOX) injection  40 mg Subcutaneous Q24H  . escitalopram  5 mg Oral Daily  . feeding supplement (PRO-STAT SUGAR FREE 64)  30 mL Oral BID  . guaiFENesin  600 mg Oral BID  . lactulose  300 mL Rectal Once  . lipase/protease/amylase  12,000 Units Oral TID AC  . mouth rinse  15 mL Mouth Rinse BID  . mometasone-formoterol  2 puff Inhalation BID  . morphine  30 mg Oral Q12H  . multivitamin  1 tablet Oral Daily  . nicotine  7 mg Transdermal Daily  . polyethylene glycol  17 g Oral Daily  . senna-docusate  2 tablet Oral BID  . tiotropium  18 mcg Inhalation Daily   Continuous Infusions: . ceFEPime (MAXIPIME) IV Stopped (07/29/17 0532)   PRN Meds:.albuterol, HYDROmorphone (DILAUDID) injection, LORazepam, morphine, ondansetron (ZOFRAN) IV, promethazine, zolpidem  Micro Results Recent Results (from the past 240 hour(s))  Culture, blood (routine x 2) Call MD if unable to obtain prior to antibiotics being given     Status: None   Collection Time: 07/24/2017 11:59 PM  Result Value Ref Range Status   Specimen Description   Final    BLOOD LEFT ANTECUBITAL Performed at [facility] [address], [place], [place] [address]    Special Requests   Final    BOTTLES DRAWN AEROBIC AND ANAEROBIC Blood Culture results may not be optimal due to an excessive volume of blood received in culture bottles Performed at [facility] [address], [place], [place] [address]    Culture   Final    NO GROWTH 5 DAYS Performed at [facility], [place] [address] [place], [place] [address]    Report Status 07/29/2017 FINAL  Final  Culture, blood (routine x 2) Call MD if unable to obtain prior to antibiotics being given     Status: None (Preliminary result)   Collection Time: 07/25/17  4:45  AM  Result Value Ref Range Status   Specimen Description   Final    BLOOD RIGHT ANTECUBITAL Performed at [facility] [address] [place], [place] [address]    Special Requests   Final    BOTTLES DRAWN AEROBIC ONLY Blood Culture adequate volume Performed at [facility] [address], [place],  [address]    Culture   Final    NO GROWTH 4 DAYS Performed at [facility], [place] [address], [place],  [address]    Report  Status PENDING  Incomplete  MRSA PCR Screening     Status: None   Collection Time: 07/25/17  8:04 AM  Result Value Ref Range Status   MRSA by PCR NEGATIVE NEGATIVE Final    Comment:        The GeneXpert MRSA Assay (FDA approved for NASAL specimens only), is one component of a comprehensive MRSA colonization surveillance program. It is not intended to diagnose MRSA infection nor to guide or monitor treatment for MRSA infections. Performed at [facility], [place] [address], [place], [place] [address]     Radiology Reports Ct Abdomen Pelvis Wo Contrast  Result Date: 08/01/2017 CLINICAL DATA:  Mid chest and back pain. Patient was recently treated for pneumonia and has L4 and L5 fractures coughing. EXAM: CT CHEST, ABDOMEN AND PELVIS WITHOUT CONTRAST TECHNIQUE: Multidetector CT imaging of the chest, abdomen and pelvis was performed following the standard protocol without IV contrast. COMPARISON:  CXR from the same day, 06/29/2017 CXR and 01/31/2015 CT AP FINDINGS: CT CHEST FINDINGS Cardiovascular: Conventional branch pattern of the great vessels with atherosclerosis of the proximal great vessels. Mild atherosclerosis of the nonaneurysmal thoracic aorta measuring up to 3.3 cm along the ascending portion. Nondilated main pulmonary artery. Heart size is normal without pericardial effusion. Minimal coronary arteriosclerosis along the distal LAD. Mediastinum/Nodes: Mediastinal adenopathy is identified  with largest lymph node right lower paratracheal measuring 2.2 cm in short axis. A 1.1 cm AP window lymph node is also noted. The trachea and mainstem bronchi are patent. Occlusion of the right lower lobe bronchus and branch vessels is noted. Lungs/Pleura: There is a confluent area of partially atelectatic/partially collapsed right lower lobe with displacement of the major fissure and inferior displacement of the right hilum from volume loss. This is likely related to obstruction of right lower lobe pulmonary bronchi possibly from mucus inspissation given somewhat tubular hypodensity new noted within the visualized bronchi. Alternatively an endobronchial lesion is not excluded or unlikely an extrinsic infrahilar mass causing extrinsic impression given the subtle tubular soft tissue opacities noted within the expected bronchi. Musculoskeletal: Superior endplate Schmorl's node of T10. No aggressive osseous lesions. CT ABDOMEN PELVIS FINDINGS Hepatobiliary: Inhomogeneous masslike hypodensities are noted of the liver concerning for liver lesions possibly metastatic given findings in the chest. No biliary dilatation is identified. The gallbladder is unremarkable. Pancreas: Normal Spleen: Normal size spleen without focal mass. Adrenals/Urinary Tract: Status post left nephrectomy. Right adrenal gland and kidney are nonacute with compensatory hypertrophy of the right kidney. Punctate renal pelvic calcifications seen on the right possibly vascular or a nonobstructing calculus. Left adrenal gland is not well visualized and may be surgically absent as well. Nodularity in the expected location of the adrenal gland is felt to represent partial volume averaging of the tail of the pancreas. Stomach/Bowel: The stomach is decompressed in appearance. Normal small bowel rotation without obstruction or inflammation. The distal and terminal ileum are normal. Normal appearing appendix is noted. Increased stool burden within the colon  compatible constipation. No large bowel inflammation. Partial colectomy on the left with sutures noted in the region of the rectosigmoid. Vascular/Lymphatic: Moderate to marked atherosclerosis of the abdominal aorta and branch vessels. Small retroperitoneal lymph nodes are identified without pathologic enlargement. Reproductive: Hysterectomy.  No adnexal mass. Other: No free air nor free fluid. Musculoskeletal: Degenerative disc disease L1 through L4. No aggressive osseous lesions identified. IMPRESSION: 1. Partial right lower lobe collapse/atelectasis with displacement of the right major fissure and caudal displacement of the right  hilum. Differential considerations for this finding may be secondary to mucous impaction. A right infrahilar mass or endobronchial lesion causing the atelectasis are not entirely excluded. Mediastinal lymphadenopathy is also noted concerning for possible metastatic disease though reactive lymph nodes might also account for this. 2. Subtle hypodensity masslike abnormalities of the unenhanced liver worrisome for possible metastatic disease in light of the pulmonary findings. CT or MRI with IV contrast would be preferable for further assessment. 3. Left nephrectomy. Compensatory hypertrophy of the right kidney with nonobstructing interpolar calculus possibly vascular. 4. Partial left colectomy. 5. Moderate atherosclerosis of the abdominal aorta and branch vessels. Electronically Signed   By: Ashley Royalty M.D.   On: 07/24/2017 21:33   Dg Chest 2 View  Result Date: 07/28/2017 CLINICAL DATA:  Chest pain EXAM: CHEST - 2 VIEW COMPARISON:  June 29, 2017 FINDINGS: There is extensive right lower lobe airspace consolidation consistent with pneumonia. Lungs elsewhere clear. Heart size and pulmonary vascularity are normal. No adenopathy. There is an old healed fracture of the lateral right sixth rib. There is aortic atherosclerosis. IMPRESSION: Extensive consolidation right lower lobe consistent  with pneumonia. Lungs elsewhere clear. Heart size normal. There is aortic atherosclerosis. Aortic Atherosclerosis (ICD10-I70.0). Electronically Signed   By: Lowella Grip III M.D.   On: 07/15/2017 20:57   Ct Head Wo Contrast  Result Date: 07/29/2017 CLINICAL DATA:  Headache and altered level of consciousness. History of lung and renal cell carcinoma. EXAM: CT HEAD WITHOUT CONTRAST TECHNIQUE: Contiguous axial images were obtained from the base of the skull through the vertex without intravenous contrast. COMPARISON:  Brain MRI 07/26/2017.  Head CT scan 05/29/2008. FINDINGS: Brain: Encephalomalacia in the parietal and occipital lobes is identified as seen on the comparison MRI. No evidence of acute intracranial abnormality including hemorrhage, infarct, mass lesion, midline shift or abnormal extra-axial fluid collection. No hydrocephalus or pneumocephalus. Vascular: Atherosclerosis is noted. Skull: Intact.  No focal lesion is identified. Sinuses/Orbits: Status post lens extraction bilaterally. No acute finding. Other: None. IMPRESSION: No acute abnormality. Areas of encephalomalacia in the parietal and occipital lobes bilaterally as seen on prior MRI and may be due to remote trauma or reversible encephalopathy. Atherosclerosis. Electronically Signed   By: Inge Rise M.D.   On: 07/29/2017 12:24   Ct Chest Wo Contrast  Result Date: 07/28/2017 CLINICAL DATA:  Mid chest and back pain. Patient was recently treated for pneumonia and has L4 and L5 fractures coughing. EXAM: CT CHEST, ABDOMEN AND PELVIS WITHOUT CONTRAST TECHNIQUE: Multidetector CT imaging of the chest, abdomen and pelvis was performed following the standard protocol without IV contrast. COMPARISON:  CXR from the same day, 06/29/2017 CXR and 01/31/2015 CT AP FINDINGS: CT CHEST FINDINGS Cardiovascular: Conventional branch pattern of the great vessels with atherosclerosis of the proximal great vessels. Mild atherosclerosis of the nonaneurysmal  thoracic aorta measuring up to 3.3 cm along the ascending portion. Nondilated main pulmonary artery. Heart size is normal without pericardial effusion. Minimal coronary arteriosclerosis along the distal LAD. Mediastinum/Nodes: Mediastinal adenopathy is identified with largest lymph node right lower paratracheal measuring 2.2 cm in short axis. A 1.1 cm AP window lymph node is also noted. The trachea and mainstem bronchi are patent. Occlusion of the right lower lobe bronchus and branch vessels is noted. Lungs/Pleura: There is a confluent area of partially atelectatic/partially collapsed right lower lobe with displacement of the major fissure and inferior displacement of the right hilum from volume loss. This is likely related to obstruction of right lower lobe pulmonary  bronchi possibly from mucus inspissation given somewhat tubular hypodensity new noted within the visualized bronchi. Alternatively an endobronchial lesion is not excluded or unlikely an extrinsic infrahilar mass causing extrinsic impression given the subtle tubular soft tissue opacities noted within the expected bronchi. Musculoskeletal: Superior endplate Schmorl's node of T10. No aggressive osseous lesions. CT ABDOMEN PELVIS FINDINGS Hepatobiliary: Inhomogeneous masslike hypodensities are noted of the liver concerning for liver lesions possibly metastatic given findings in the chest. No biliary dilatation is identified. The gallbladder is unremarkable. Pancreas: Normal Spleen: Normal size spleen without focal mass. Adrenals/Urinary Tract: Status post left nephrectomy. Right adrenal gland and kidney are nonacute with compensatory hypertrophy of the right kidney. Punctate renal pelvic calcifications seen on the right possibly vascular or a nonobstructing calculus. Left adrenal gland is not well visualized and may be surgically absent as well. Nodularity in the expected location of the adrenal gland is felt to represent partial volume averaging of the  tail of the pancreas. Stomach/Bowel: The stomach is decompressed in appearance. Normal small bowel rotation without obstruction or inflammation. The distal and terminal ileum are normal. Normal appearing appendix is noted. Increased stool burden within the colon compatible constipation. No large bowel inflammation. Partial colectomy on the left with sutures noted in the region of the rectosigmoid. Vascular/Lymphatic: Moderate to marked atherosclerosis of the abdominal aorta and branch vessels. Small retroperitoneal lymph nodes are identified without pathologic enlargement. Reproductive: Hysterectomy.  No adnexal mass. Other: No free air nor free fluid. Musculoskeletal: Degenerative disc disease L1 through L4. No aggressive osseous lesions identified. IMPRESSION: 1. Partial right lower lobe collapse/atelectasis with displacement of the right major fissure and caudal displacement of the right hilum. Differential considerations for this finding may be secondary to mucous impaction. A right infrahilar mass or endobronchial lesion causing the atelectasis are not entirely excluded. Mediastinal lymphadenopathy is also noted concerning for possible metastatic disease though reactive lymph nodes might also account for this. 2. Subtle hypodensity masslike abnormalities of the unenhanced liver worrisome for possible metastatic disease in light of the pulmonary findings. CT or MRI with IV contrast would be preferable for further assessment. 3. Left nephrectomy. Compensatory hypertrophy of the right kidney with nonobstructing interpolar calculus possibly vascular. 4. Partial left colectomy. 5. Moderate atherosclerosis of the abdominal aorta and branch vessels. Electronically Signed   By: Ashley Royalty M.D.   On: 07/22/2017 21:33   Mr Jeri Cos BE Contrast  Result Date: 07/26/2017 CLINICAL DATA:  Non-small cell lung cancer, here for staging. History of type 1 Chiari malformation, stroke. EXAM: MRI HEAD WITHOUT AND WITH CONTRAST  TECHNIQUE: Multiplanar, multiecho pulse sequences of the brain and surrounding structures were obtained without and with intravenous contrast. CONTRAST:  62m MULTIHANCE GADOBENATE DIMEGLUMINE 529 MG/ML IV SOLN COMPARISON:  MRI head May 29, 2008 FINDINGS: Sequences vary from moderate to severely motion degraded. INTRACRANIAL CONTENTS: No reduced diffusion to suggest acute ischemia or hypercellular tumor. No susceptibility artifact to suggest hemorrhage. Biparietal bilateral occipital lobe encephalomalacia. No overall parenchymal brain volume loss for age. A few scattered subcentimeter supratentorial white matter FLAIR T2 hyperintensities compatible with chronic small vessel ischemic changes. 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 No midline shift, mass effect or masses. No abnormal extra-axial fluid collections. Low-lying cerebellar tonsils difficult to quantify due to motion. VASCULAR: Normal major intracranial vascular flow voids present at skull base. SKULL AND UPPER CERVICAL SPINE: No abnormal sellar expansion. Heterogeneous bone marrow signal. Craniocervical junction maintained. SINUSES/ORBITS: The mastoid air-cells and included paranasal sinuses are well-aerated.The included ocular  globes and orbital contents are non-suspicious. Status post bilateral ocular lens implants. OTHER: None. IMPRESSION: 1. Moderate to severely motion degraded examination. 2. No identified intracranial metastasis, however motion decreases sensitivity for small metastasis or leptomeningeal disease. If continued concern for metastatic disease, sedation may be warranted. 3. Nonspecific heterogeneous bone marrow signal, infiltrative tumor not excluded 4. 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 Electronically Signed   By: Elon Alas M.D.   On: 07/26/2017 19:35   Mr Abdomen W Wo Contrast  Result Date: 07/24/2017 CLINICAL DATA:  LEFT lower lobe collapse difficulty breathing.  Indeterminate hepatic lesions identified on noncontrast CT. History of renal cell carcinoma. LEFT nephrectomy EXAM: MRI ABDOMEN WITHOUT AND WITH CONTRAST TECHNIQUE: Multiplanar multisequence MR imaging of the abdomen was performed both before and after the administration of intravenous contrast. CONTRAST:  47m MULTIHANCE GADOBENATE DIMEGLUMINE 529 MG/ML IV SOLN COMPARISON:  CT 07/06/2017 FINDINGS: Lower chest: A complete collapse of the RIGHT lower lobe with obstruction of the RIGHT lower lobe bronchus. On coronal imaging there is masslike lesion surrounding the bronchus intermedius measuring 6.4 by 4.1 cm (image 16/6 per Hepatobiliary: Too numerous to count round enhancing lesions extensively involving the LEFT RIGHT hepatic lobe. Lesions range in size from 10 mm to 33 mm. Approximately 100 lesions. Largest lesion occupies the LEFT hepatic lobe measures 6.4 cm large example lesion in the RIGHT hepatic lobe measures 3.6 cm in the dome (image 27/9 0 point. There is no biliary duct dilatation. The gallbladder and common bile duct are normal. Pancreas: Pancreas is normal. No ductal dilatation. No pancreatic inflammation. Spleen: Normal spleen Adrenals/urinary tract: Adrenal glands are normal. Post LEFT nephrectomy. Compensatory hypertrophy of the RIGHT kidney. No RIGHT renal lesion. Stomach/Bowel: Stomach and limited view of the bowel is unremarkable. Vascular/Lymphatic: Normal abdominal aorta. No significant adenopathy Reproductive: Other: No free fluid. Musculoskeletal: No aggressive osseous lesion. IMPRESSION: 1. Large RIGHT hilar mass surrounds the bronchus intermedius and extends into the mediastinum consistent with primary malignancy versus metastatic lesion. This lesion obstructs the RIGHT lower bronchus with complete atelectasis of the RIGHT lower lobe. 2. 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 Lesions reached near confluence and and number approximately 100. 3. LEFT nephrectomy.  Normal  hypertrophy RIGHT kidney. 4. No bony metastasis identified Electronically Signed   By: SSuzy BouchardM.D.   On: 07/24/2017 15:28   [place]  Result Date: 07/26/2017 INDICATION: Large right hilar mass, innumerable liver metastases EXAM: ULTRASOUND GUIDED CORE BIOPSY OF RIGHT LIVER LESION MEDICATIONS: 1% lidocaine local ANESTHESIA/SEDATION: Versed 1.'5mg'$  IV; Fentanyl 558m IV; Moderate Sedation Time:  10 minutes The patient was continuously monitored during the procedure by the interventional radiology nurse under my direct supervision. FLUOROSCOPY TIME:  Fluoroscopy Time: None. COMPLICATIONS: None immediate. PROCEDURE: The procedure, risks, benefits, and alternatives were explained to the patient. Questions regarding the procedure were encouraged and answered. The patient understands and consents to the procedure. Previous imaging reviewed. Preliminary ultrasound performed. A right hepatic lesion was demonstrated along the right subcostal margin adjacent to the gallbladder. Overlying skin marked. Under sterile conditions and local anesthesia, a 17 gauge 6.8 cm access needle was advanced to the lesion. Needle position confirmed with ultrasound. 18 gauge core biopsies obtained. Images obtained for documentation. No immediate complication. Patient tolerated the biopsy well. FINDINGS: Imaging confirms needle placed in the right hepatic lesion for core biopsy. IMPRESSION: Successful ultrasound right hepatic mass 18 gauge core biopsy Electronically Signed   By: M.Jerilynn Mages Shick M.D.  On: 07/26/2017 16:34    Time Spent in minutes  30   Jani Gravel M.D on 07/29/2017 at 12:57 PM  Between 7am to 7pm - Pager - [phone]    After 7pm go to [email or web address] - password [facility]  [facility] -  Office  [phone]

## 2017-07-29 NOTE — Telephone Encounter (Signed)
Lft the pt a vm with appt information to see Dr. Alvy Bimler for a hospital follow up on 7/29 at 1245pm

## 2017-07-29 NOTE — Progress Notes (Signed)
Called Dr. Maudie Mercury about patient presentation. Patient is very sleepy and very difficult to arouse. Patient also has large twitching in all arms and legs. Poor body control and safely awareness. Will keep in the bed and hold pain medications. Also will collect a ABG and head CT. Will continue to monitor.

## 2017-07-29 NOTE — Progress Notes (Signed)
[facility]  S: Pt. Is lethargic, having a hard time staying awake  O: Difficult to arouse ABG done per primary team>> pH: 7.37, PCO2: 43.7, PO2: 146, Bicarb 25.1 Ammonia Level>> 97 umol/L CT Head 07/29/2017>>pending  Vitals:   07/28/17 1946 07/28/17 2140 07/29/17 0423 07/29/17 1033  BP:  114/74 102/78   Pulse:  88 88   Resp:  20 14   Temp:  98.4 F (36.9 C) 98.4 F (36.9 C)   TempSrc:  Oral Oral   SpO2: 94% 94% 95% 97%  Weight:      Height:       4 L [place], Saturations are 97%, RR 14  General:  Sitting on bedside commode , lethargic, states she feels very weak HENT: NCAT OP clear, No LAD PULM: Slightly diminished per bases, normal effort, no accessory muscle use, NAD CV: S1, S2, RRR, no mgr GI: BS+, soft, non tender, ND, BS + MSK: thin and frail,  no obvious deformities Neuro: awake, lethargic alert and oriented x 2-3,  no distress, MAE x 4, hand tremor  CT chest images : large R LL mass, mediastinal adenopathy, RLL consolidation MRI abdomen images : innumerable lesions in the liver  Impression/plan:  There is extensive right lower lobe airspace consolidation consistent with pneumonia>> continue antibiotics per primary team. CXR prn, titrate oxygen to maintain sats 88-92%  Lung mass, smoker with mediastinal lymphadenopathy and likely metastatic lesions in liver.Highly suspicious for possible new primary lung cancer with widespread metastatic disease to the liver.MRI negative for intracranial mets  Liver biopsy done 07/26/2017 by IR for staging purposes.>> pathology is pending, but note per radiation oncology,preliminary diagnosis of cancer from her liver biopsy. IHC is pending but could be small cell carcinoma.>> Follow for final pathology result   . Radiation oncologist has been consulted; palliative radiation treatment to start 07/28/2017, planned 10 treatments. Elevated LFT's may exclude choices of chemotherapy. Therapy to be delayed until this afternoon or cancelled for 7/26   based on patient condition   Pt. Was originally for  discharge 07/29/2017, however she has had a change in mental status this morning. She is very sleepy and difficult to arouse . She has what appears to be asterixis. Ammonia level is 97, and lactulose has been initiated. CT head shows areas of encephalomalacia in the parietal and occipital lobes bilaterally seen on prior MRI and may be 2/2  remote trauma or reversible encephalopathy. . All pain medication has been discontinued.She had emesis today that was dark per RN. She is having BM's so do not suspect obstruction. She did have her first palliative radiation 7/25. They will delay  today's treatment until this afternoon, or cancel is she does not improve. Palliation discussions may be appropriate if she does not improve. She has been scheduled for hospital  follow up with Pulmonary  August 8 th at 11 am with Wyn Quaker, NP. This can be rescheduled if patient has not been discharged by that date.   Magdalen Spatz, AGACNP-BC [place] [facility] Pager: [phone]  07/29/2017 12:03 PM

## 2017-07-29 NOTE — Progress Notes (Signed)
Informed Dr. Maudie Mercury about increase in stools. Patient's mental status has improved since this morning and she is able to stay awake at longer intervals and hold conversations with family and staff. Will continue to monitor.

## 2017-07-30 LAB — COMPREHENSIVE METABOLIC PANEL
ALK PHOS: 509 U/L — AB (ref 38–126)
ALT: 99 U/L — AB (ref 0–44)
ANION GAP: 10 (ref 5–15)
AST: 124 U/L — ABNORMAL HIGH (ref 15–41)
Albumin: 2.4 g/dL — ABNORMAL LOW (ref 3.5–5.0)
BILIRUBIN TOTAL: 1.8 mg/dL — AB (ref 0.3–1.2)
BUN: 43 mg/dL — ABNORMAL HIGH (ref 8–23)
CALCIUM: 8.6 mg/dL — AB (ref 8.9–10.3)
CO2: 27 mmol/L (ref 22–32)
CREATININE: 0.68 mg/dL (ref 0.44–1.00)
Chloride: 102 mmol/L (ref 98–111)
Glucose, Bld: 95 mg/dL (ref 70–99)
Potassium: 4.7 mmol/L (ref 3.5–5.1)
SODIUM: 139 mmol/L (ref 135–145)
TOTAL PROTEIN: 5.5 g/dL — AB (ref 6.5–8.1)

## 2017-07-30 LAB — GASTROINTESTINAL PANEL BY PCR, STOOL (REPLACES STOOL CULTURE)
ASTROVIRUS: NOT DETECTED
Adenovirus F40/41: NOT DETECTED
CAMPYLOBACTER SPECIES: NOT DETECTED
CRYPTOSPORIDIUM: NOT DETECTED
CYCLOSPORA CAYETANENSIS: NOT DETECTED
ENTAMOEBA HISTOLYTICA: NOT DETECTED
ENTEROTOXIGENIC E COLI (ETEC): NOT DETECTED
Enteroaggregative E coli (EAEC): NOT DETECTED
Enteropathogenic E coli (EPEC): DETECTED — AB
Giardia lamblia: NOT DETECTED
Norovirus GI/GII: NOT DETECTED
PLESIMONAS SHIGELLOIDES: NOT DETECTED
Rotavirus A: NOT DETECTED
SAPOVIRUS (I, II, IV, AND V): NOT DETECTED
SHIGA LIKE TOXIN PRODUCING E COLI (STEC): NOT DETECTED
Salmonella species: NOT DETECTED
Shigella/Enteroinvasive E coli (EIEC): NOT DETECTED
VIBRIO CHOLERAE: NOT DETECTED
Vibrio species: NOT DETECTED
YERSINIA ENTEROCOLITICA: NOT DETECTED

## 2017-07-30 LAB — CBC
HEMATOCRIT: 36.1 % (ref 36.0–46.0)
HEMOGLOBIN: 11.4 g/dL — AB (ref 12.0–15.0)
MCH: 29.2 pg (ref 26.0–34.0)
MCHC: 31.6 g/dL (ref 30.0–36.0)
MCV: 92.3 fL (ref 78.0–100.0)
Platelets: 253 10*3/uL (ref 150–400)
RBC: 3.91 MIL/uL (ref 3.87–5.11)
RDW: 15.9 % — ABNORMAL HIGH (ref 11.5–15.5)
WBC: 11.4 10*3/uL — ABNORMAL HIGH (ref 4.0–10.5)

## 2017-07-30 LAB — CULTURE, BLOOD (ROUTINE X 2)
Culture: NO GROWTH
Special Requests: ADEQUATE

## 2017-07-30 LAB — AMMONIA: Ammonia: 74 umol/L — ABNORMAL HIGH (ref 9–35)

## 2017-07-30 MED ORDER — LACTULOSE 10 GM/15ML PO SOLN
10.0000 g | Freq: Every day | ORAL | Status: DC
  Administered 2017-07-30: 10 g via ORAL
  Filled 2017-07-30: qty 30

## 2017-07-30 MED ORDER — MORPHINE SULFATE (PF) 2 MG/ML IV SOLN
1.0000 mg | INTRAVENOUS | Status: DC | PRN
  Administered 2017-07-31: 1 mg via INTRAVENOUS
  Filled 2017-07-30: qty 1

## 2017-07-30 MED ORDER — DICLOFENAC SODIUM 1 % TD GEL
4.0000 g | Freq: Three times a day (TID) | TRANSDERMAL | Status: DC
  Administered 2017-07-30 – 2017-08-03 (×7): 4 g via TOPICAL
  Filled 2017-07-30: qty 100

## 2017-07-30 MED ORDER — MORPHINE SULFATE (CONCENTRATE) 10 MG/0.5ML PO SOLN
5.0000 mg | ORAL | Status: DC | PRN
  Administered 2017-07-30 – 2017-07-31 (×5): 5 mg via ORAL
  Filled 2017-07-30 (×5): qty 0.5

## 2017-07-30 MED ORDER — OXYCODONE HCL 5 MG PO TABS
10.0000 mg | ORAL_TABLET | Freq: Four times a day (QID) | ORAL | Status: DC | PRN
  Filled 2017-07-30: qty 2

## 2017-07-30 MED ORDER — IPRATROPIUM-ALBUTEROL 0.5-2.5 (3) MG/3ML IN SOLN
3.0000 mL | Freq: Three times a day (TID) | RESPIRATORY_TRACT | Status: DC
  Administered 2017-07-30 – 2017-07-31 (×4): 3 mL via RESPIRATORY_TRACT
  Filled 2017-07-30 (×6): qty 3

## 2017-07-30 MED ORDER — POTASSIUM CHLORIDE IN NACL 20-0.9 MEQ/L-% IV SOLN
INTRAVENOUS | Status: AC
  Administered 2017-07-30 – 2017-07-31 (×2): via INTRAVENOUS
  Filled 2017-07-30 (×2): qty 1000

## 2017-07-30 MED ORDER — LACTULOSE ENEMA
300.0000 mL | Freq: Once | ORAL | Status: AC
  Administered 2017-07-30: 300 mL via RECTAL
  Filled 2017-07-30: qty 300

## 2017-07-30 NOTE — Progress Notes (Signed)
Patient ID: Wanda James, female   DOB: 1955/05/11, 62 y.o.   MRN: [id number]                                                                PROGRESS NOTE                                                                                                                                                                                                             Patient Demographics:    Wanda James, is a 62 y.o. female, DOB - 08/26/1955, IRJ:[id number]  Admit date - 07/18/2017   Admitting Physician Jani Gravel, MD  Outpatient Primary MD for the patient is Althisar, Peggyann Juba  LOS - 7  Outpatient Specialists:     Chief Complaint  Patient presents with  . Chest Pain       Brief Narrative    62 y.o.female,w h/o renal cell carcinoma, nicotine dep, [place] home o2, apparently presents with c/oSharp chest pain of the xyphoid process starting last nite. + sob, wheezing. + cough, mostly dry. Denies fever, chills, palp, n/v, diarrhea, brbpr, dysuria, hematuria.   In Ed,  CT scan chest/ abd IMPRESSION: 1. Partial right lower lobe collapse/atelectasis with displacement of the right major fissure and caudal displacement of the right hilum. Differential considerations for this finding may be secondary to mucous impaction. A right infrahilar mass or endobronchial lesion causing the atelectasis are not entirely excluded. Mediastinal lymphadenopathy is also noted concerning for possible metastatic disease though reactive lymph nodes might also account for this. 2. Subtle hypodensity masslike abnormalities of the unenhanced liver worrisome for possible metastatic disease in light of the pulmonary findings. CT or MRI with IV contrast would be preferable for further assessment. 3. Left nephrectomy. Compensatory hypertrophy of the right kidney with nonobstructing interpolar calculus possibly vascular. 4. Partial left colectomy. 5. Moderate atherosclerosis of the abdominal aorta and branch  vessels.   Trop <0.03 Wbc 6.8, Hgb 14.5, Plt 304 Na 138, K 3.7, Bun 11, Creatinine 0.81 Ast 193, Alt 129, Alk phos 518, T. Bili 0.3 Lipase 99  Lactic acid 2.27  Pt will be admitted for chest pain, Copd exacerbation, Hcap, and abnormal liver function           Subjective:    [name] today is more  awake.  Cut back on her pain medication yesterday and she seemed to do better. Dilaudid appears too strong.  Biopsy report back but family didn't want me to tell her at this time.   No headache, No chest pain, No abdominal pain - No Nausea, No new weakness tingling or numbness, No Cough - SOB.   Assessment  & Plan :    Principal Problem:   Chest pain Active Problems:   COPD exacerbation (HCC)   HCAP (healthcare-associated pneumonia)   Cancer associated pain   Liver metastases (HCC)   Other constipation   Nausea & vomiting     AMS likley secondary to pain medication, hyperammonemia Start oxycodone '10mg'$  po q6h prn  Check CT brain => no acute process Check ABG=> co2 wnl Check ammonia level in AM Lactulose 30gm po x1 given (7/26) Lactulose 15gm po x1 (7/27) Check ammonia level in AM    N/v Xray Abdomen 2 view => unremarkable hemeoccult + (of gastric fluid), doubt ugi bleeding Cont phenergan Cont zofran Start pepcid '20mg'$  iv bid (7/26)   Right hilar mass. Liver metastasis. Suspected to have pneumonia with a work-up was suggestive of for large rightlungmass. Pulmonary consulted and felt that the patient should get liver biopsy to help with staging as well. IR consulted and the biopsy 07/26/2017. Results pending If the biopsy is negative patient will require bronchoscopy and biopsy as well. Currently no significant wheezing appreciated. 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 Suspected healthcare associated  pneumonia. CT chest also suggested a possible postobstructive pneumonia versus atelectasis. Pulmonary consulted as well. Patient was on IV Solu-Medrol although I do not suspect that the patient does have any active COPD exacerbation and therefore will transition to oral prednisone.=>off MRSA PCR is negative therefore will discontinue vancomycin. Blood cultures so far negative as well. Continue cefepime for now and will likely transition her to oral Augmentin for the same. Monitor other recommendation from pulmonary. History of COPD continue home inhalers. Radiation oncology consulted , appreciate input  Chest pain, Chronic lower back pain Anxiety. Tramadol=> ineffective Percocet => mildly effective Continue Dilaudid MS contin '30mg'$  po bid Currently significant anxious and in distress, PRN Ativan ordered. If continues to have significant anxiety and difficult control pain and will consult palliative care for input. Currently do not have a diagnosis and therefore do not have a plan of care regarding her cancer and therefore will hold off on palliative care consultation for goals of care. DC MS Contin DC Dilaudid Oxycodone '10mg'$  po q6h prn   History of renal cell carcinoma S/P left renal nephrectomy. Avoid nephrotoxic agent. Monitor.  Active smoker. More than 40 years of history. Nicotine patch.  Type 2 diabetes mellitus. Continue sliding scale insulin.  Severe protein calorie malnutrition Start prostat 66m po bid (7/25)  Anxiety Start lexapo '5mg'$  po qday (7/25)  Diet: carb modified diet DVT Prophylaxis:subcutaneous Heparin  Advance goals of care discussion:full code  Family Communication:family was present at bedside, at the time of interview. The pt provided permission to discuss medical plan with the family. Opportunity was given to ask question and all questions were answered satisfactorily.   Disposition: Discharge to be  determined.  Consultants:PCCM, IR, oncology Procedures:Liver biopsy       Anti-infectives (From admission, onward)   Start     Dose/Rate Route Frequency Ordered Stop   07/24/17 2200  vancomycin (VANCOCIN) IVPB 750  mg/150 ml premix  Status:  Discontinued     750 mg 150 mL/hr over 60 Minutes Intravenous Every 24 hours 07/09/2017 2334 07/25/17 1327   07/24/17 0600  ceFEPIme (MAXIPIME) 1 g in sodium chloride 0.9 % 100 mL IVPB     1 g 200 mL/hr over 30 Minutes Intravenous Every 8 hours 07/05/2017 2322 08/01/17 0559   07/26/2017 2145  vancomycin (VANCOCIN) IVPB 1000 mg/200 mL premix     1,000 mg 200 mL/hr over 60 Minutes Intravenous  Once 07/29/2017 2143 07/26/2017 2353   07/14/2017 2145  ceFEPIme (MAXIPIME) 2 g in sodium chloride 0.9 % 100 mL IVPB     2 g 200 mL/hr over 30 Minutes Intravenous  Once 07/17/2017 2143 07/28/2017 2230        Objective:   Vitals:   07/29/17 2014 07/29/17 2154 07/30/17 0312 07/30/17 0917  BP:  106/70 118/83   Pulse:  (!) 107 100 89  Resp:  '18 20 18  '$ Temp:  98.3 F (36.8 C) 98.2 F (36.8 C)   TempSrc:      SpO2: 94% 94% 93% 95%  Weight:      Height:        Wt Readings from Last 3 Encounters:  07/24/17 50.7 kg (111 lb 12.4 oz)  06/26/17 53.4 kg (117 lb 12.8 oz)  08/19/16 53.5 kg (118 lb)     Intake/Output Summary (Last 24 hours) at 07/30/2017 1017 Last data filed at 07/30/2017 0200 Gross per 24 hour  Intake 650 ml  Output -  Net 650 ml     Physical Exam  Awake Alert, Oriented X 3, No new F.N deficits, Normal affect .AT,PERRAL Supple Neck,No JVD, No cervical lymphadenopathy appriciated.  Symmetrical Chest wall movement, Good air movement bilaterally, CTAB RRR,No Gallops,Rubs or new Murmurs, No Parasternal Heave +ve B.Sounds, Abd Soft, No tenderness, No organomegaly appriciated, No rebound - guarding or rigidity. No Cyanosis, Clubbing or edema, No new Rash or bruise      Data Review:    CBC Recent Labs  Lab 07/22/2017 2025   07/27/17 0504 07/28/17 0434 07/29/17 0433 07/29/17 1653 07/30/17 0434  WBC 6.8   < > 7.6 8.7 13.8* 11.8* 11.4*  HGB 14.5   < > 12.1 12.0 13.0 11.8* 11.4*  HCT 43.7   < > 38.1 38.6 41.6 38.1 36.1  PLT 304   < > 290 281 309 275 253  MCV 88.6   < > 90.7 91.7 92.7 93.2 92.3  MCH 29.4   < > 28.8 28.5 29.0 28.9 29.2  MCHC 33.2   < > 31.8 31.1 31.3 31.0 31.6  RDW 14.7   < > 16.0* 16.0* 16.0* 15.8* 15.9*  LYMPHSABS 0.8  --  0.6*  --   --   --   --   MONOABS 0.8  --  0.0*  --   --   --   --   EOSABS 0.2  --  0.0  --   --   --   --   BASOSABS 0.0  --  0.0  --   --   --   --    < > = values in this interval not displayed.    Chemistries  Recent Labs  Lab 07/25/17 0445 07/26/17 0446 07/27/17 0504 07/28/17 0434 07/29/17 0433 07/30/17 0434  NA 138 138 139 138 135 139  K 4.0 4.0 4.7 5.0 5.2* 4.7  CL 104 102 103 101 100 102  CO2 '24 26 29 30 '$ 26  27  GLUCOSE 129* 97 101* 114* 77 95  BUN '14 22 23 '$ 29* 41* 43*  CREATININE 0.64 0.59 0.54 0.63 0.71 0.68  CALCIUM 8.5* 8.5* 8.5* 8.6* 8.8* 8.6*  MG 1.8  --  2.3  --   --   --   AST 132* 125* 118* 121* 127* 124*  ALT 105* 99* 94* 95* 104* 99*  ALKPHOS 417* 447* 470* 466* 528* 509*  BILITOT 0.7 0.7 0.8 1.0 1.3* 1.8*   ------------------------------------------------------------------------------------------------------------------ No results for input(s): CHOL, HDL, LDLCALC, TRIG, CHOLHDL, LDLDIRECT in the last 72 hours.  Lab Results  Component Value Date   HGBA1C 6.8 (H) 06/28/2017   ------------------------------------------------------------------------------------------------------------------ No results for input(s): TSH, T4TOTAL, T3FREE, THYROIDAB in the last 72 hours.  Invalid input(s): FREET3 ------------------------------------------------------------------------------------------------------------------ No results for input(s): VITAMINB12, FOLATE, FERRITIN, TIBC, IRON, RETICCTPCT in the last 72 hours.  Coagulation  profile Recent Labs  Lab 07/25/17 1124  INR 1.23    No results for input(s): DDIMER in the last 72 hours.  Cardiac Enzymes Recent Labs  Lab 07/24/17 0443 07/24/17 1116 07/29/17 1328  TROPONINI <0.03 <0.03 <0.03   ------------------------------------------------------------------------------------------------------------------    Component Value Date/Time   BNP 25.0 06/26/2017 1506    Inpatient Medications  Scheduled Meds: . aspirin EC  325 mg Oral Daily  . dexamethasone  4 mg Oral Daily  . enoxaparin (LOVENOX) injection  40 mg Subcutaneous Q24H  . escitalopram  5 mg Oral Daily  . feeding supplement (PRO-STAT SUGAR FREE 64)  30 mL Oral BID  . guaiFENesin  600 mg Oral BID  . ipratropium-albuterol  3 mL Nebulization TID  . lactulose  10 g Oral Daily  . lipase/protease/amylase  12,000 Units Oral TID AC  . mouth rinse  15 mL Mouth Rinse BID  . mometasone-formoterol  2 puff Inhalation BID  . multivitamin  1 tablet Oral Daily  . nicotine  7 mg Transdermal Daily  . polyethylene glycol  17 g Oral Daily  . senna-docusate  2 tablet Oral BID  . tiotropium  18 mcg Inhalation Daily   Continuous Infusions: . ceFEPime (MAXIPIME) IV Stopped (07/30/17 0550)  . famotidine (PEPCID) IV 20 mg (07/30/17 0916)   PRN Meds:.albuterol, LORazepam, ondansetron (ZOFRAN) IV, oxyCODONE, promethazine, zolpidem  Micro Results Recent Results (from the past 240 hour(s))  Culture, blood (routine x 2) Call MD if unable to obtain prior to antibiotics being given     Status: None   Collection Time: 07/29/2017 11:59 PM  Result Value Ref Range Status   Specimen Description   Final    BLOOD LEFT ANTECUBITAL Performed at [facility] [address], [place], [place] [address]    Special Requests   Final    BOTTLES DRAWN AEROBIC AND ANAEROBIC Blood Culture results may not be optimal due to an excessive volume of blood received in culture bottles Performed at [facility] [address] [place], [place] [address]    Culture   Final    NO GROWTH 5 DAYS Performed at [facility], [place] [address], [place], [place] [address]    Report Status 07/29/2017 FINAL  Final  Culture, blood (routine x 2) Call MD if unable to obtain prior to antibiotics being given     Status: None (Preliminary result)   Collection Time: 07/25/17  4:45 AM  Result Value Ref Range Status   Specimen Description   Final    BLOOD RIGHT ANTECUBITAL Performed at [facility], [address] [place], [place] [address]    Special Requests   Final    BOTTLES DRAWN AEROBIC ONLY Blood Culture adequate volume Performed at [facility] [address] [place], [place] [address]    Culture   Final    NO GROWTH 4 DAYS Performed at [facility], [place] [address] [place], [place] [address]    Report Status PENDING  Incomplete  MRSA PCR Screening     Status: None   Collection Time: 07/25/17  8:04 AM  Result Value Ref Range Status   MRSA by PCR NEGATIVE NEGATIVE Final    Comment:        The GeneXpert MRSA Assay (FDA approved for NASAL specimens only), is one component of a comprehensive MRSA colonization surveillance program. It is not intended to diagnose MRSA infection nor to guide or monitor treatment for MRSA infections. Performed at [facility], [place] [address], [place], [place] [address]     Radiology Reports Ct Abdomen Pelvis Wo Contrast  Result Date: 07/26/2017 CLINICAL DATA:  Mid chest and back pain. Patient was recently treated for pneumonia and has L4 and L5 fractures coughing. EXAM: CT CHEST, ABDOMEN AND PELVIS WITHOUT CONTRAST TECHNIQUE: Multidetector CT imaging of the chest, abdomen and pelvis was performed following the standard protocol without IV contrast. COMPARISON:  CXR from the same day, 06/29/2017 CXR and 01/31/2015 CT AP FINDINGS: CT CHEST FINDINGS Cardiovascular: Conventional branch  pattern of the great vessels with atherosclerosis of the proximal great vessels. Mild atherosclerosis of the nonaneurysmal thoracic aorta measuring up to 3.3 cm along the ascending portion. Nondilated main pulmonary artery. Heart size is normal without pericardial effusion. Minimal coronary arteriosclerosis along the distal LAD. Mediastinum/Nodes: Mediastinal adenopathy is identified with largest lymph node right lower paratracheal measuring 2.2 cm in short axis. A 1.1 cm AP window lymph node is also noted. The trachea and mainstem bronchi are patent. Occlusion of the right lower lobe bronchus and branch vessels is noted. Lungs/Pleura: There is a confluent area of partially atelectatic/partially collapsed right lower lobe with displacement of the major fissure and inferior displacement of the right hilum from volume loss. This is likely related to obstruction of right lower lobe pulmonary bronchi possibly from mucus inspissation given somewhat tubular hypodensity new noted within the visualized bronchi. Alternatively an endobronchial lesion is not excluded or unlikely an extrinsic infrahilar mass causing extrinsic impression given the subtle tubular soft tissue opacities noted within the expected bronchi. Musculoskeletal: Superior endplate Schmorl's node of T10. No aggressive osseous lesions. CT ABDOMEN PELVIS FINDINGS Hepatobiliary: Inhomogeneous masslike hypodensities are noted of the liver concerning for liver lesions possibly metastatic given findings in the chest. No biliary dilatation is identified. The gallbladder is unremarkable. Pancreas: Normal Spleen: Normal size spleen without focal mass. Adrenals/Urinary Tract: Status post left nephrectomy. Right adrenal gland and kidney are nonacute with compensatory hypertrophy of the right kidney. Punctate renal pelvic calcifications seen on the right possibly vascular or a nonobstructing calculus. Left adrenal gland is not well visualized and may be surgically  absent as well. Nodularity in the expected location of the adrenal gland is felt to represent partial volume averaging of the tail of the pancreas. Stomach/Bowel: The stomach is decompressed in appearance. Normal small bowel rotation without obstruction or inflammation. The distal and terminal ileum are normal. Normal appearing appendix is noted. Increased stool burden within the colon compatible constipation. No large bowel inflammation. Partial colectomy on the left with sutures noted in the region  of the rectosigmoid. Vascular/Lymphatic: Moderate to marked atherosclerosis of the abdominal aorta and branch vessels. Small retroperitoneal lymph nodes are identified without pathologic enlargement. Reproductive: Hysterectomy.  No adnexal mass. Other: No free air nor free fluid. Musculoskeletal: Degenerative disc disease L1 through L4. No aggressive osseous lesions identified. IMPRESSION: 1. Partial right lower lobe collapse/atelectasis with displacement of the right major fissure and caudal displacement of the right hilum. Differential considerations for this finding may be secondary to mucous impaction. A right infrahilar mass or endobronchial lesion causing the atelectasis are not entirely excluded. Mediastinal lymphadenopathy is also noted concerning for possible metastatic disease though reactive lymph nodes might also account for this. 2. Subtle hypodensity masslike abnormalities of the unenhanced liver worrisome for possible metastatic disease in light of the pulmonary findings. CT or MRI with IV contrast would be preferable for further assessment. 3. Left nephrectomy. Compensatory hypertrophy of the right kidney with nonobstructing interpolar calculus possibly vascular. 4. Partial left colectomy. 5. Moderate atherosclerosis of the abdominal aorta and branch vessels. Electronically Signed   By: Ashley Royalty M.D.   On: 07/22/2017 21:33   Dg Chest 2 View  Result Date: 07/26/2017 CLINICAL DATA:  Chest pain EXAM:  CHEST - 2 VIEW COMPARISON:  June 29, 2017 FINDINGS: There is extensive right lower lobe airspace consolidation consistent with pneumonia. Lungs elsewhere clear. Heart size and pulmonary vascularity are normal. No adenopathy. There is an old healed fracture of the lateral right sixth rib. There is aortic atherosclerosis. IMPRESSION: Extensive consolidation right lower lobe consistent with pneumonia. Lungs elsewhere clear. Heart size normal. There is aortic atherosclerosis. Aortic Atherosclerosis (ICD10-I70.0). Electronically Signed   By: Lowella Grip III M.D.   On: 08/02/2017 20:57   Ct Head Wo Contrast  Result Date: 07/29/2017 CLINICAL DATA:  Headache and altered level of consciousness. History of lung and renal cell carcinoma. EXAM: CT HEAD WITHOUT CONTRAST TECHNIQUE: Contiguous axial images were obtained from the base of the skull through the vertex without intravenous contrast. COMPARISON:  Brain MRI 07/26/2017.  Head CT scan 05/29/2008. FINDINGS: Brain: Encephalomalacia in the parietal and occipital lobes is identified as seen on the comparison MRI. No evidence of acute intracranial abnormality including hemorrhage, infarct, mass lesion, midline shift or abnormal extra-axial fluid collection. No hydrocephalus or pneumocephalus. Vascular: Atherosclerosis is noted. Skull: Intact.  No focal lesion is identified. Sinuses/Orbits: Status post lens extraction bilaterally. No acute finding. Other: None. IMPRESSION: No acute abnormality. Areas of encephalomalacia in the parietal and occipital lobes bilaterally as seen on prior MRI and may be due to remote trauma or reversible encephalopathy. Atherosclerosis. Electronically Signed   By: Inge Rise M.D.   On: 07/29/2017 12:24   Ct Chest Wo Contrast  Result Date: 07/26/2017 CLINICAL DATA:  Mid chest and back pain. Patient was recently treated for pneumonia and has L4 and L5 fractures coughing. EXAM: CT CHEST, ABDOMEN AND PELVIS WITHOUT CONTRAST TECHNIQUE:  Multidetector CT imaging of the chest, abdomen and pelvis was performed following the standard protocol without IV contrast. COMPARISON:  CXR from the same day, 06/29/2017 CXR and 01/31/2015 CT AP FINDINGS: CT CHEST FINDINGS Cardiovascular: Conventional branch pattern of the great vessels with atherosclerosis of the proximal great vessels. Mild atherosclerosis of the nonaneurysmal thoracic aorta measuring up to 3.3 cm along the ascending portion. Nondilated main pulmonary artery. Heart size is normal without pericardial effusion. Minimal coronary arteriosclerosis along the distal LAD. Mediastinum/Nodes: Mediastinal adenopathy is identified with largest lymph node right lower paratracheal measuring 2.2 cm in short axis.  A 1.1 cm AP window lymph node is also noted. The trachea and mainstem bronchi are patent. Occlusion of the right lower lobe bronchus and branch vessels is noted. Lungs/Pleura: There is a confluent area of partially atelectatic/partially collapsed right lower lobe with displacement of the major fissure and inferior displacement of the right hilum from volume loss. This is likely related to obstruction of right lower lobe pulmonary bronchi possibly from mucus inspissation given somewhat tubular hypodensity new noted within the visualized bronchi. Alternatively an endobronchial lesion is not excluded or unlikely an extrinsic infrahilar mass causing extrinsic impression given the subtle tubular soft tissue opacities noted within the expected bronchi. Musculoskeletal: Superior endplate Schmorl's node of T10. No aggressive osseous lesions. CT ABDOMEN PELVIS FINDINGS Hepatobiliary: Inhomogeneous masslike hypodensities are noted of the liver concerning for liver lesions possibly metastatic given findings in the chest. No biliary dilatation is identified. The gallbladder is unremarkable. Pancreas: Normal Spleen: Normal size spleen without focal mass. Adrenals/Urinary Tract: Status post left nephrectomy. Right  adrenal gland and kidney are nonacute with compensatory hypertrophy of the right kidney. Punctate renal pelvic calcifications seen on the right possibly vascular or a nonobstructing calculus. Left adrenal gland is not well visualized and may be surgically absent as well. Nodularity in the expected location of the adrenal gland is felt to represent partial volume averaging of the tail of the pancreas. Stomach/Bowel: The stomach is decompressed in appearance. Normal small bowel rotation without obstruction or inflammation. The distal and terminal ileum are normal. Normal appearing appendix is noted. Increased stool burden within the colon compatible constipation. No large bowel inflammation. Partial colectomy on the left with sutures noted in the region of the rectosigmoid. Vascular/Lymphatic: Moderate to marked atherosclerosis of the abdominal aorta and branch vessels. Small retroperitoneal lymph nodes are identified without pathologic enlargement. Reproductive: Hysterectomy.  No adnexal mass. Other: No free air nor free fluid. Musculoskeletal: Degenerative disc disease L1 through L4. No aggressive osseous lesions identified. IMPRESSION: 1. Partial right lower lobe collapse/atelectasis with displacement of the right major fissure and caudal displacement of the right hilum. Differential considerations for this finding may be secondary to mucous impaction. A right infrahilar mass or endobronchial lesion causing the atelectasis are not entirely excluded. Mediastinal lymphadenopathy is also noted concerning for possible metastatic disease though reactive lymph nodes might also account for this. 2. Subtle hypodensity masslike abnormalities of the unenhanced liver worrisome for possible metastatic disease in light of the pulmonary findings. CT or MRI with IV contrast would be preferable for further assessment. 3. Left nephrectomy. Compensatory hypertrophy of the right kidney with nonobstructing interpolar calculus possibly  vascular. 4. Partial left colectomy. 5. Moderate atherosclerosis of the abdominal aorta and branch vessels. Electronically Signed   By: Ashley Royalty M.D.   On: 07/08/2017 21:33   Mr Jeri Cos ZO Contrast  Result Date: 07/26/2017 CLINICAL DATA:  Non-small cell lung cancer, here for staging. History of type 1 Chiari malformation, stroke. EXAM: MRI HEAD WITHOUT AND WITH CONTRAST TECHNIQUE: Multiplanar, multiecho pulse sequences of the brain and surrounding structures were obtained without and with intravenous contrast. CONTRAST:  49m MULTIHANCE GADOBENATE DIMEGLUMINE 529 MG/ML IV SOLN COMPARISON:  MRI head May 29, 2008 FINDINGS: Sequences vary from moderate to severely motion degraded. INTRACRANIAL CONTENTS: No reduced diffusion to suggest acute ischemia or hypercellular tumor. No susceptibility artifact to suggest hemorrhage. Biparietal bilateral occipital lobe encephalomalacia. No overall parenchymal brain volume loss for age. A few scattered subcentimeter supratentorial white matter FLAIR T2 hyperintensities compatible with chronic small  vessel ischemic changes. 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 No midline shift, mass effect or masses. No abnormal extra-axial fluid collections. Low-lying cerebellar tonsils difficult to quantify due to motion. VASCULAR: Normal major intracranial vascular flow voids present at skull base. SKULL AND UPPER CERVICAL SPINE: No abnormal sellar expansion. Heterogeneous bone marrow signal. Craniocervical junction maintained. SINUSES/ORBITS: The mastoid air-cells and included paranasal sinuses are well-aerated.The included ocular globes and orbital contents are non-suspicious. Status post bilateral ocular lens implants. OTHER: None. IMPRESSION: 1. Moderate to severely motion degraded examination. 2. No identified intracranial metastasis, however motion decreases sensitivity for small metastasis or leptomeningeal disease. If continued concern for metastatic disease, sedation may  be warranted. 3. Nonspecific heterogeneous bone marrow signal, infiltrative tumor not excluded 4. 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 Electronically Signed   By: Elon Alas M.D.   On: 07/26/2017 19:35   Mr Abdomen W Wo Contrast  Result Date: 07/24/2017 CLINICAL DATA:  LEFT lower lobe collapse difficulty breathing. Indeterminate hepatic lesions identified on noncontrast CT. History of renal cell carcinoma. LEFT nephrectomy EXAM: MRI ABDOMEN WITHOUT AND WITH CONTRAST TECHNIQUE: Multiplanar multisequence MR imaging of the abdomen was performed both before and after the administration of intravenous contrast. CONTRAST:  55m MULTIHANCE GADOBENATE DIMEGLUMINE 529 MG/ML IV SOLN COMPARISON:  CT 07/12/2017 FINDINGS: Lower chest: A complete collapse of the RIGHT lower lobe with obstruction of the RIGHT lower lobe bronchus. On coronal imaging there is masslike lesion surrounding the bronchus intermedius measuring 6.4 by 4.1 cm (image 16/6 per Hepatobiliary: Too numerous to count round enhancing lesions extensively involving the LEFT RIGHT hepatic lobe. Lesions range in size from 10 mm to 33 mm. Approximately 100 lesions. Largest lesion occupies the LEFT hepatic lobe measures 6.4 cm large example lesion in the RIGHT hepatic lobe measures 3.6 cm in the dome (image 27/9 0 point. There is no biliary duct dilatation. The gallbladder and common bile duct are normal. Pancreas: Pancreas is normal. No ductal dilatation. No pancreatic inflammation. Spleen: Normal spleen Adrenals/urinary tract: Adrenal glands are normal. Post LEFT nephrectomy. Compensatory hypertrophy of the RIGHT kidney. No RIGHT renal lesion. Stomach/Bowel: Stomach and limited view of the bowel is unremarkable. Vascular/Lymphatic: Normal abdominal aorta. No significant adenopathy Reproductive: Other: No free fluid. Musculoskeletal: No aggressive osseous lesion. IMPRESSION: 1. Large RIGHT hilar mass  surrounds the bronchus intermedius and extends into the mediastinum consistent with primary malignancy versus metastatic lesion. This lesion obstructs the RIGHT lower bronchus with complete atelectasis of the RIGHT lower lobe. 2. 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 Lesions reached near confluence and and number approximately 100. 3. LEFT nephrectomy.  Normal hypertrophy RIGHT kidney. 4. No bony metastasis identified Electronically Signed   By: SSuzy BouchardM.D.   On: 07/24/2017 15:28   [place]opsy (liver)  Result Date: 07/26/2017 INDICATION: Large right hilar mass, innumerable liver metastases EXAM: ULTRASOUND GUIDED CORE BIOPSY OF RIGHT LIVER LESION MEDICATIONS: 1% lidocaine local ANESTHESIA/SEDATION: Versed 1.'5mg'$  IV; Fentanyl 557m IV; Moderate Sedation Time:  10 minutes The patient was continuously monitored during the procedure by the interventional radiology nurse under my direct supervision. FLUOROSCOPY TIME:  Fluoroscopy Time: None. COMPLICATIONS: None immediate. PROCEDURE: The procedure, risks, benefits, and alternatives were explained to the patient. Questions regarding the procedure were encouraged and answered. The patient understands and consents to the procedure. Previous imaging reviewed. Preliminary ultrasound performed. A right hepatic lesion was demonstrated along the right subcostal margin adjacent to the gallbladder. Overlying skin marked. Under sterile conditions  and local anesthesia, a 17 gauge 6.8 cm access needle was advanced to the lesion. Needle position confirmed with ultrasound. 18 gauge core biopsies obtained. Images obtained for documentation. No immediate complication. Patient tolerated the biopsy well. FINDINGS: Imaging confirms needle placed in the right hepatic lesion for core biopsy. IMPRESSION: Successful ultrasound right hepatic mass 18 gauge core biopsy Electronically Signed   By: Jerilynn Mages.  Shick M.D.   On: 07/26/2017 16:34   Dg Abd 2 Views  Result Date:  07/29/2017 CLINICAL DATA:  Hemoptysis, abdominal pain. EXAM: ABDOMEN - 2 VIEW COMPARISON:  None. FINDINGS: The bowel gas pattern is normal. There is no evidence of free air. No radio-opaque calculi or other significant radiographic abnormality is seen. IMPRESSION: No evidence of bowel obstruction or ileus. No definite pneumoperitoneum is noted. Electronically Signed   By: Marijo Conception, M.D.   On: 07/29/2017 16:40    Time Spent in minutes  30   Jani Gravel M.D on 07/30/2017 at 10:17 AM  Between 7am to 7pm - Pager - [phone]  After 7pm go to [email or web address] - password [facility]  [facility] -  Office  [phone]

## 2017-07-30 NOTE — Progress Notes (Signed)
Patient ID: Wanda James, female   DOB: 1955-05-08, 62 y.o.   MRN: [id number]   Pt had difficulty swallowing oral lactulose, lactulose enema pr x1.  Her pain has been an issue.  She couldn't swallow oxycodone.  Liquid morphine and iv morphine ordered Will use voltaren gel to help with her back pain.  Trying to achieve a balance between too much sedation vs pain control Palliative care consulted as well Appreciate input

## 2017-07-30 NOTE — Progress Notes (Signed)
[facility] PCCM  S: Very sleepy this morning According to family members at bedside, she did well last evening until she started having pain and then required opiates to keep her comfortable Did have diarrhea yesterday with lactulose-scaled back  O: Difficult to arouse, interacting with family intermittently ABG done per primary team>> pH: 7.37, PCO2: 43.7, PO2: 146, Bicarb 25.1 Ammonia Level>> 97 umol/L-improving, currently 74 CT Head 07/29/2017>> no evidence of metastatic disease  Vitals:   07/29/17 2014 07/29/17 2154 07/30/17 0312 07/30/17 0917  BP:  106/70 118/83   Pulse:  (!) 107 100 89  Resp:  18 20 18   Temp:  98.3 F (36.8 C) 98.2 F (36.8 C)   TempSrc:      SpO2: 94% 94% 93% 95%  Weight:      Height:       On oxygen supplementation, saturating well  Patient is resting comfortably with family at bedside Physical exam not performed-she wants to rest   CT chest images : large R LL mass, mediastinal adenopathy, RLL consolidation MRI abdomen images : innumerable lesions in the liver  Impression/plan:  There is extensive right lower lobe airspace consolidation consistent with pneumonia>> continue antibiotics per primary team. 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 Radiation oncologist-palliative radiation treatment 07/28/2017, planned 10 treatments.  Elevated LFT's may exclude choices of chemotherapy.  Goal currently is to get her stable enough to be discharged to continue therapy  Mental status is good if she is more sleepy according to family members Asterixis did improve They appreciate that she needs to be comfortable, need to find a balance between pain and discomfort and sleepiness that may be associated with  pain medications  Approximately 25 minutes spent with patient and family discussing options of care and our primary goals at present  She has been scheduled for hospital  follow up with Pulmonary  August 8 th at 11 am with Wyn Quaker, NP. This can be rescheduled if patient has not been discharged by that date.   07/30/2017 1:04 PM

## 2017-07-30 NOTE — Progress Notes (Addendum)
Patient currently too weak to take Spiriva inhaler appropriately. TID Duoneb ordered.

## 2017-07-31 ENCOUNTER — Inpatient Hospital Stay (HOSPITAL_COMMUNITY): Payer: BLUE CROSS/BLUE SHIELD

## 2017-07-31 DIAGNOSIS — R109 Unspecified abdominal pain: Secondary | ICD-10-CM

## 2017-07-31 LAB — TROPONIN I: Troponin I: 0.04 ng/mL (ref <0.03)

## 2017-07-31 LAB — CBC
HCT: 35.8 % — ABNORMAL LOW (ref 36.0–46.0)
HCT: 35.7 % — ABNORMAL LOW (ref 36.0–46.0)
Hemoglobin: 11.1 g/dL — ABNORMAL LOW (ref 12.0–15.0)
Hemoglobin: 11.3 g/dL — ABNORMAL LOW (ref 12.0–15.0)
MCH: 28.8 pg (ref 26.0–34.0)
MCH: 28.9 pg (ref 26.0–34.0)
MCHC: 31 g/dL (ref 30.0–36.0)
MCHC: 31.7 g/dL (ref 30.0–36.0)
MCV: 93 fL (ref 78.0–100.0)
MCV: 91.3 fL (ref 78.0–100.0)
PLATELETS: 266 10*3/uL (ref 150–400)
Platelets: 232 10*3/uL (ref 150–400)
RBC: 3.85 MIL/uL — ABNORMAL LOW (ref 3.87–5.11)
RBC: 3.91 MIL/uL (ref 3.87–5.11)
RDW: 16.2 % — AB (ref 11.5–15.5)
RDW: 16.4 % — AB (ref 11.5–15.5)
WBC: 14.2 10*3/uL — AB (ref 4.0–10.5)
WBC: 14.5 10*3/uL — AB (ref 4.0–10.5)

## 2017-07-31 LAB — COMPREHENSIVE METABOLIC PANEL
ALT: 86 U/L — ABNORMAL HIGH (ref 0–44)
ALT: 79 U/L — ABNORMAL HIGH (ref 0–44)
AST: 115 U/L — AB (ref 15–41)
AST: 115 U/L — ABNORMAL HIGH (ref 15–41)
Albumin: 2.3 g/dL — ABNORMAL LOW (ref 3.5–5.0)
Albumin: 2.1 g/dL — ABNORMAL LOW (ref 3.5–5.0)
Alkaline Phosphatase: 483 U/L — ABNORMAL HIGH (ref 38–126)
Alkaline Phosphatase: 406 U/L — ABNORMAL HIGH (ref 38–126)
Anion gap: 11 (ref 5–15)
Anion gap: 10 (ref 5–15)
BUN: 33 mg/dL — AB (ref 8–23)
BUN: 25 mg/dL — ABNORMAL HIGH (ref 8–23)
CHLORIDE: 106 mmol/L (ref 98–111)
CHLORIDE: 108 mmol/L (ref 98–111)
CO2: 25 mmol/L (ref 22–32)
CO2: 25 mmol/L (ref 22–32)
Calcium: 8.5 mg/dL — ABNORMAL LOW (ref 8.9–10.3)
Calcium: 8.3 mg/dL — ABNORMAL LOW (ref 8.9–10.3)
Creatinine, Ser: 0.56 mg/dL (ref 0.44–1.00)
Creatinine, Ser: 0.45 mg/dL (ref 0.44–1.00)
GFR calc Af Amer: >60 mL/min (ref >60)
Glucose, Bld: 88 mg/dL (ref 70–99)
Glucose, Bld: 107 mg/dL — ABNORMAL HIGH (ref 70–99)
POTASSIUM: 4.8 mmol/L (ref 3.5–5.1)
POTASSIUM: 4.3 mmol/L (ref 3.5–5.1)
SODIUM: 143 mmol/L (ref 135–145)
Sodium: 142 mmol/L (ref 135–145)
Total Bilirubin: 2.2 mg/dL — ABNORMAL HIGH (ref 0.3–1.2)
Total Bilirubin: 2.3 mg/dL — ABNORMAL HIGH (ref 0.3–1.2)
Total Protein: 5.3 g/dL — ABNORMAL LOW (ref 6.5–8.1)
Total Protein: 5 g/dL — ABNORMAL LOW (ref 6.5–8.1)

## 2017-07-31 LAB — BLOOD GAS, ARTERIAL
ACID-BASE EXCESS: 1.3 mmol/L (ref 0.0–2.0)
Bicarbonate: 25.6 mmol/L (ref 20.0–28.0)
DRAWN BY: 11249
FIO2: 75
O2 Content: 10 L/min
O2 SAT: 92.2 %
PATIENT TEMPERATURE: 99.8
pCO2 arterial: 43.1 mmHg (ref 32.0–48.0)
pH, Arterial: 7.396 (ref 7.350–7.450)
pO2, Arterial: 70.1 mmHg — ABNORMAL LOW (ref 83.0–108.0)

## 2017-07-31 LAB — AMMONIA: AMMONIA: 73 umol/L — AB (ref 9–35)

## 2017-07-31 LAB — D-DIMER, QUANTITATIVE: D-Dimer, Quant: 1.32 ug/mL-FEU — ABNORMAL HIGH (ref 0.00–0.50)

## 2017-07-31 MED ORDER — TECHNETIUM TO 99M ALBUMIN AGGREGATED
3.9000 | Freq: Once | INTRAVENOUS | Status: AC
  Administered 2017-07-31: 3.9 via INTRAVENOUS

## 2017-07-31 MED ORDER — MORPHINE SULFATE (PF) 2 MG/ML IV SOLN
2.0000 mg | INTRAVENOUS | Status: DC | PRN
Start: 2017-07-31 — End: 2017-07-31
  Administered 2017-07-31: 2 mg via INTRAVENOUS
  Filled 2017-07-31: qty 1

## 2017-07-31 MED ORDER — MORPHINE SULFATE (PF) 2 MG/ML IV SOLN
2.0000 mg | Freq: Once | INTRAVENOUS | Status: AC
  Administered 2017-07-31: 2 mg via INTRAVENOUS
  Filled 2017-07-31: qty 1

## 2017-07-31 MED ORDER — MORPHINE SULFATE (PF) 2 MG/ML IV SOLN
1.0000 mg | INTRAVENOUS | Status: DC | PRN
  Administered 2017-07-31: 1 mg via INTRAVENOUS
  Filled 2017-07-31: qty 1

## 2017-07-31 MED ORDER — ENSURE ENLIVE PO LIQD: 237.0000 mL | Freq: Two times a day (BID) | ORAL | Status: DC

## 2017-07-31 MED ORDER — KCL IN DEXTROSE-NACL 20-5-0.9 MEQ/L-%-% IV SOLN
INTRAVENOUS | Status: DC
  Filled 2017-07-31: qty 1000

## 2017-07-31 MED ORDER — MORPHINE SULFATE (PF) 2 MG/ML IV SOLN
2.0000 mg | INTRAVENOUS | Status: DC | PRN
  Administered 2017-07-31 – 2017-08-01 (×3): 1 mg via INTRAVENOUS
  Administered 2017-08-01 (×2): 2 mg via INTRAVENOUS
  Administered 2017-08-01: 1 mg via INTRAVENOUS
  Administered 2017-08-01 (×2): 2 mg via INTRAVENOUS
  Administered 2017-08-01: 1 mg via INTRAVENOUS
  Administered 2017-08-02 (×5): 2 mg via INTRAVENOUS
  Filled 2017-07-31 (×12): qty 1

## 2017-07-31 MED ORDER — KCL IN DEXTROSE-NACL 20-5-0.9 MEQ/L-%-% IV SOLN
INTRAVENOUS | Status: AC
  Administered 2017-07-31: 14:00:00 via INTRAVENOUS
  Filled 2017-07-31 (×2): qty 1000

## 2017-07-31 MED ORDER — LEVALBUTEROL HCL 0.63 MG/3ML IN NEBU: 0.6300 mg | INHALATION_SOLUTION | Freq: Four times a day (QID) | RESPIRATORY_TRACT | Status: DC | PRN

## 2017-07-31 MED ORDER — LACTULOSE ENEMA
300.0000 mL | Freq: Once | ORAL | Status: DC
  Filled 2017-07-31 (×2): qty 300

## 2017-07-31 MED ORDER — SCOPOLAMINE 1 MG/3DAYS TD PT72
1.0000 | MEDICATED_PATCH | TRANSDERMAL | Status: DC
  Administered 2017-07-31: 1.5 mg via TRANSDERMAL
  Filled 2017-07-31: qty 1

## 2017-07-31 MED ORDER — TECHNETIUM TC 99M DIETHYLENETRIAME-PENTAACETIC ACID
28.3000 | Freq: Once | INTRAVENOUS | Status: AC
  Administered 2017-07-31: 28.3 via RESPIRATORY_TRACT

## 2017-07-31 NOTE — Progress Notes (Signed)
Patient had difficult time swallowing oral medication even when medication was crushed in apple sauce. Will continue to monitor.

## 2017-07-31 NOTE — Progress Notes (Signed)
This RN gave report to [employer] in ICU/Stepdown. Family members were informed that the patient would be transferred to ICU. The patient was then transferred to room 1237.

## 2017-07-31 NOTE — Progress Notes (Addendum)
   07/31/17 1457  Vitals  Temp 99.5 F (37.5 C)  Temp Source Oral  BP 120/71  MAP (mmHg) 86  BP Location Right Arm  BP Method Automatic  Patient Position (if appropriate) Lying  Pulse Rate (!) 103  Resp 15  Oxygen Therapy  SpO2 91 %  O2 Device Nasal Cannula  O2 Flow Rate (L/min) 5 L/min   MD notified about temp of 99.5 and HR of 103

## 2017-07-31 NOTE — Progress Notes (Signed)
Patient/family refused lactulose enema at this time due to patient experiencing pain. MD notified. New orders received.  Barbee Shropshire. Brigitte Pulse, RN

## 2017-07-31 NOTE — Consult Note (Signed)
Consultation Note Date: 07/31/2017   Patient Name: Wanda James  DOB: 10/18/1955  MRN: [id number]  Age / Sex: 62 y.o., female  PCP: Leighton Ruff Referring Physician: Jani Gravel, MD  Reason for Consultation: Establishing goals of care and pain management.    HPI/Patient Profile: 62 y.o. female  admitted on 07/10/2017     Clinical Assessment and Goals of Care:  62 yo lady recently diagnosed with diffuse metastatic cancer. History of smoking. History of L kidney cancer s/p nephrectomy 20 years ago. Recently hospitalized for pneumonia. CT imaging of the chest which showed significant findings including partial right lower lobe collapse with presence of a right infrahilar mass and mediastinal lymphadenopathy. CT scan of the abdomen showed suspicious lesions in her liver.  MRI of the liver quantify nearly 100 liver metastases. Liver biopsy with METASTATIC SMALL CELL CARCINOMA TO LIVER. Possibly to be considered for palliative chemotherapy.   Hospital course complicated by patient with escalating pain, pain management needs, not able to swallow.   A palliative consult has been requested for goals of care discussions, as well as for symptom management.   The patient is resting in bed, her husband, son, daughter, niece, nephew, and grand child and several other family members present in the room.   I introduced myself and palliative care as follows: Palliative medicine is specialized medical care for people living with serious illness. It focuses on providing relief from the symptoms and stress of a serious illness. The goal is to improve quality of life for both the patient and the family.  Briefly discussed with family about the patient's hospital course and her recent diagnosis of small cell lung cancer. Goals, wishes and values discussed. All of their concerns addressed to the best of my ability.   We  discussed about adequate pain management.   We also reviewed extensively about nutrition and hydration. The patient isn't able to safely swallow. Family not in favor of TNA/tube feeds. We discussed about adjusting her IVF. I have discussed with [facility] MD Dr. Maudie Mercury.   Additionally, also discussed about the fact that the patient remains at risk for ongoing decline, in spite of best measures and plans. Family is aware, son becomes tearful, their overall goals are not, at the moment, for comfort care. PMT to continue to follow and support the family.    NEXT OF KIN  husband, son daughter niece nephew at bedside Patient has 3 children. She lives in [place] with her husband.   SUMMARY OF RECOMMENDATIONS    1. Full code, full scope for now.  2. Family hopeful for stabilization/recovery so that the patient might become well enough to tolerate palliative chemotherapy in the near future. They describe her as a 'fighter" 3. Continue efforts at adequate pain management 4. Withhold PO medications as patient unable to safely swallow, request pharmacy to convert medications to IV as much as possible.  5. Have begun gentle discussions with family members about the patient's overall condition, her malignancy, metastatic burden to liver, generalized  deconditioning, functional decline, low palliative performance scale. Gently discussed that if the patient have ongoing decline in this hospitalization, then we may have to consider a more comfort-focused/ hospice philosophy of care. Family is tearful and aware. PMT to continue to follow along.   Code Status/Advance Care Planning:  Full code    Symptom Management:    as above   Palliative Prophylaxis:   Delirium Protocol  Additional Recommendations (Limitations, Scope, Preferences):  Full Scope Treatment  Psycho-social/Spiritual:   Desire for further Chaplaincy support:yes  Additional Recommendations: Education on Hospice  Prognosis:   Unable  to determine  Discharge Planning: To Be Determined      Primary Diagnoses: Present on Admission: . Chest pain . COPD exacerbation ([place]) . HCAP (healthcare-associated pneumonia)   I have reviewed the medical record, interviewed the patient and family, and examined the patient. The following aspects are pertinent.  Past Medical History:  Diagnosis Date  . Acid reflux   . Arthritis   . Cancer ([place])    kidney  . Cataract   . Chiari malformation type I ([place])   . Complication of anesthesia    Hard To Awaken  . COPD (chronic obstructive pulmonary disease) ([place])   . Diverticulitis   . Headache   . Pneumonia   . Stroke ([place])   . Subclavian artery stenosis, left ([facility])    s/p angioplasty/stent 04/25/08   Social History   Socioeconomic History  . Marital status: Married    Spouse name: Not on file  . Number of children: Not on file  . Years of education: Not on file  . Highest education level: Not on file  Occupational History  . Not on file  Social Needs  . Financial resource strain: Not on file  . Food insecurity:    Worry: Not on file    Inability: Not on file  . Transportation needs:    Medical: Not on file    Non-medical: Not on file  Tobacco Use  . Smoking status: Current Every Day Smoker    Packs/day: 1.00    Types: Cigarettes  . Smokeless tobacco: Never Used  Substance and Sexual Activity  . Alcohol use: No  . Drug use: No  . Sexual activity: Not on file  Lifestyle  . Physical activity:    Days per week: Not on file    Minutes per session: Not on file  . Stress: Not on file  Relationships  . Social connections:    Talks on phone: Not on file    Gets together: Not on file    Attends religious service: Not on file    Active member of club or organization: Not on file    Attends meetings of clubs or organizations: Not on file    Relationship status: Not on file  Other Topics Concern  . Not on file  Social History Narrative  . Not on file   Family  History  Problem Relation Age of Onset  . Lung cancer Father    Scheduled Meds: . aspirin EC  325 mg Oral Daily  . dexamethasone  4 mg Oral Daily  . diclofenac sodium  4 g Topical TID  . enoxaparin (LOVENOX) injection  40 mg Subcutaneous Q24H  . escitalopram  5 mg Oral Daily  . feeding supplement (ENSURE ENLIVE)  237 mL Oral BID BM  . feeding supplement (PRO-STAT SUGAR FREE 64)  30 mL Oral BID  . guaiFENesin  600 mg Oral BID  . ipratropium-albuterol  3 mL Nebulization TID  . lipase/protease/amylase  12,000 Units Oral TID AC  . mouth rinse  15 mL Mouth Rinse BID  . mometasone-formoterol  2 puff Inhalation BID  . multivitamin  1 tablet Oral Daily  . nicotine  7 mg Transdermal Daily  . polyethylene glycol  17 g Oral Daily  . senna-docusate  2 tablet Oral BID  . tiotropium  18 mcg Inhalation Daily   Continuous Infusions: . ceFEPime (MAXIPIME) IV Stopped (07/31/17 1093)  . famotidine (PEPCID) IV 20 mg (07/31/17 1002)   PRN Meds:.albuterol, LORazepam, morphine injection, morphine CONCENTRATE, ondansetron (ZOFRAN) IV, promethazine, zolpidem Medications Prior to Admission:  Prior to Admission medications   Medication Sig Start Date End Date Taking? Authorizing Provider  aspirin EC 325 MG tablet Take 325 mg by mouth daily.   Yes [provider]  clarithromycin (BIAXIN) 500 MG tablet TK 1 T PO BID 07/19/17  Yes [provider]  CREON 24000-76000 units CPEP TK 1 C PO TID WITH MEALS AND WITH EVENING SNACKS 06/15/17  Yes [provider]  Melatonin 3 MG TABS Take 3 mg by mouth at bedtime.   Yes [provider]  nicotine (NICODERM CQ - DOSED IN MG/24 HOURS) 21 mg/24hr patch APP 1 PA EXT TO THE SKIN D 07/20/17  Yes [provider]  ondansetron (ZOFRAN) 8 MG tablet TK 1 T PO TID PRN NAUSEA AND VOMITING 07/20/17  Yes [provider]  promethazine (PHENERGAN) 25 MG tablet TK 1/2 T PO BID PRF NAUSEA 05/13/17  Yes [provider]  SYMBICORT  80-4.5 MCG/ACT inhaler INHALE 2 PUFFS PO BID PRN FOR SOB AND WHEEZING 07/20/17  Yes [provider]  traMADol (ULTRAM) 50 MG tablet Take 50 mg by mouth every 6 (six) hours as needed for severe pain.   Yes [provider]  acetaminophen (TYLENOL) 325 MG tablet Take 2 tablets (650 mg total) by mouth every 6 (six) hours as needed for mild pain (or Fever >/= 101). 07/01/17   Debbe Odea, MD  albuterol (PROVENTIL) (2.5 MG/3ML) 0.083% nebulizer solution Take 3 mLs (2.5 mg total) by nebulization every 2 (two) hours as needed for wheezing or shortness of breath. 07/01/17   Debbe Odea, MD  dextromethorphan-guaiFENesin (MUCINEX DM) 30-600 MG 12hr tablet Take 1 tablet by mouth 2 (two) times daily. Patient not taking: Reported on 07/24/2017 07/01/17   Debbe Odea, MD  diphenhydrAMINE (BENADRYL) 25 MG tablet Take 25 mg by mouth 2 (two) times daily as needed (allergic reactions).    [provider]  docusate sodium (COLACE) 100 MG capsule Take 100 mg by mouth as needed for mild constipation.    [provider]  glucose monitoring kit (FREESTYLE) monitoring kit 1 each by Does not apply route 4 (four) times daily - after meals and at bedtime. 1 month Diabetic Testing Supplies for QAC-QHS accuchecks. 07/01/17   Debbe Odea, MD  ipratropium-albuterol (DUONEB) 0.5-2.5 (3) MG/3ML SOLN Take 3 mLs by nebulization every 6 (six) hours as needed. Patient taking differently: Take 3 mLs by nebulization every 6 (six) hours as needed (sob and wheezing).  07/01/17   Debbe Odea, MD  levofloxacin (LEVAQUIN) 750 MG tablet Take 1 tablet (750 mg total) by mouth daily. Patient not taking: Reported on 07/15/2017 07/01/17   Debbe Odea, MD  nicotine (NICODERM CQ) 14 mg/24hr patch Place 1 patch (14 mg total) onto the skin daily. Start when the 21 mg patches are finished Patient not taking: Reported on 08/03/2017 07/01/17  Debbe Odea, MD  nicotine (NICODERM CQ) 7 mg/24hr patch Place 1 patch (7 mg  total) onto the skin daily. Patient not taking: Reported on 07/06/2017 07/01/17   Debbe Odea, MD  PROAIR HFA 108 [phone] Base) MCG/ACT inhaler INL 1 INHALATION PO QID PRN FOR SOB AND WHEEZING 07/19/17   [provider]   Allergies  Allergen Reactions  . Iohexol Anaphylaxis     Desc: ANAPHALAXIS-ARS ON 08-22-04 Went into a coma  . Sodium Hypochlorite Other (See Comments)    Solution used to place on packing burned very bad, patient states she can't use it any more due to this/thjcma  . Penicillins Hives and Swelling    Has patient had a PCN reaction causing immediate rash, facial/tongue/throat swelling, SOB or lightheadedness with hypotension: No Has patient had a PCN reaction causing severe rash involving mucus membranes or skin necrosis: Yes Has patient had a PCN reaction that required hospitalization: No Has patient had a PCN reaction occurring within the last 10 years: No If all of the above answers are "NO", then may proceed with Cephalosporin use.   Marland Kitchen Hydrocodone Nausea And Vomiting  . Tape Rash    Itchy rash   Review of Systems +pain  Physical Exam Awakens some Is able to nod/mumble appropriately to questions asked Regular  S1 S2 some what tachycardic abd soft No edema  Vital Signs: BP 117/77 (BP Location: Left Arm)   Pulse 100   Temp 98.8 F (37.1 C) (Oral)   Resp 20   Ht '5\' 2"'$  (1.575 m)   Wt 50.7 kg (111 lb 12.4 oz)   SpO2 90%   BMI 20.44 kg/m  Pain Scale: Faces POSS *See Group Information*: 2-Acceptable,Slightly drowsy, easily aroused Pain Score: Asleep   SpO2: SpO2: 90 % O2 Device:SpO2: 90 % O2 Flow Rate: .O2 Flow Rate (L/min): 5 L/min  IO: Intake/output summary:   Intake/Output Summary (Last 24 hours) at 07/31/2017 1125 Last data filed at 07/31/2017 0600 Gross per 24 hour  Intake 1675.42 ml  Output -  Net 1675.42 ml    LBM: Last BM Date: 07/30/17 Baseline Weight: Weight: 51.3 kg (113 lb) Most recent weight: Weight: 50.7 kg (111 lb 12.4 oz)      Palliative Assessment/Data:   Flowsheet Rows     Most Recent Value  Intake Tab  Referral Department  Hospitalist  Unit at Time of Referral  Med/Surg Unit  Date Notified  07/24/17  Palliative Care Type  Not seen  Reason Not Seen  Consult cancelled  Reason for referral  Clarify Goals of Care  Date of Admission  07/14/2017  # of days IP prior to Palliative referral  1  Clinical Assessment  Psychosocial & Spiritual Assessment  Palliative Care Outcomes     PPS 30%  Time In:  10 Time Out:  11 Time Total:  60 min  Greater than 50%  of this time was spent counseling and coordinating care related to the above assessment and plan.  Signed by: Loistine Chance, MD  [phone]  Please contact Palliative Medicine Team phone at [phone] for questions and concerns.  For individual provider: See Shea Evans

## 2017-07-31 NOTE — Progress Notes (Addendum)
Hypoxia ? Due to morphine abg stat CXR portable Transfer to stepdown  Tachycardia Cont tele Trop I q6h x3 Check D dimer If positive VQ scan stat since has iv dye allergy, can't do CTA chest  Abdominal pain Per RN family declined CT scan  Hyperammonemia Per RN, family declined lactulose

## 2017-07-31 NOTE — Progress Notes (Signed)
Attempted patient on HFNC for scan per request of nursing.  On 15 lpm saturations decreased to 84% with 2 minutes.  Pt. Is a mouth breather and despite instructions has difficulty with nasal respirations.  RN informed of results.

## 2017-07-31 NOTE — Progress Notes (Signed)
CRITICAL VALUE ALERT  Critical Value:  Trop 0.04  Date & Time Notied:  07/31/2017 2018  Provider Notified: X. Blount, NP  Orders Received/Actions taken: Awaiting further orders. Also notified NP that D-dimer resulted as 1.32.

## 2017-07-31 NOTE — Progress Notes (Signed)
Patient unable to swallow oral medications. Attempted to crush medications but patient began coughing. MD notified.  Wanda James. Brigitte Pulse, RN

## 2017-07-31 NOTE — Progress Notes (Signed)
Family requests lactulose enema and CT scan be performed when patients pain is in better control. Patient currently scoring 8-9 on PAINAD scale with HR 120-130s. Lactulose enema rescheduled for 2200 and CT aware, will pass along to next shift RN. Family educated on need/indication for lactulose and CT scan.  Barbee Shropshire. Brigitte Pulse, RN

## 2017-07-31 NOTE — Progress Notes (Signed)
Patient continues to be in pain with PAINAD score of 8-9. Respirations 38 and O2 saturation 83% on 5-7L. Patient switched to nonrebreather and saturations increased to 95%. Md notified. New orders received.  Barbee Shropshire. Brigitte Pulse, RN

## 2017-07-31 NOTE — Progress Notes (Addendum)
Patient ID: Wanda James, female   DOB: 18-May-1955, 62 y.o.   MRN: [id number]                                                                PROGRESS NOTE                                                                                                                                                                                                             Patient Demographics:    Wanda James, is a 62 y.o. female, DOB - 06/15/1955, OHY:[id number]  Admit date - 07/04/2017   Admitting Physician Jani Gravel, MD  Outpatient Primary MD for the patient is Althisar, Peggyann Juba  LOS - 8  Outpatient Specialists:     Chief Complaint  Patient presents with  . Chest Pain       Brief Narrative 62 y.o.female,w h/o renal cell carcinoma, nicotine dep, [place] home o2, apparently presents with c/oSharp chest pain of the xyphoid process starting last nite. + sob, wheezing. + cough, mostly dry. Denies fever, chills, palp, n/v, diarrhea, brbpr, dysuria, hematuria.   In Ed,  CT scan chest/ abd IMPRESSION: 1. Partial right lower lobe collapse/atelectasis with displacement of the right major fissure and caudal displacement of the right hilum. Differential considerations for this finding may be secondary to mucous impaction. A right infrahilar mass or endobronchial lesion causing the atelectasis are not entirely excluded. Mediastinal lymphadenopathy is also noted concerning for possible metastatic disease though reactive lymph nodes might also account for this. 2. Subtle hypodensity masslike abnormalities of the unenhanced liver worrisome for possible metastatic disease in light of the pulmonary findings. CT or MRI with IV contrast would be preferable for further assessment. 3. Left nephrectomy. Compensatory hypertrophy of the right kidney with nonobstructing interpolar calculus possibly vascular. 4. Partial left colectomy. 5. Moderate atherosclerosis of the abdominal aorta and branch vessels.   Trop  <0.03 Wbc 6.8, Hgb 14.5, Plt 304 Na 138, K 3.7, Bun 11, Creatinine 0.81 Ast 193, Alt 129, Alk phos 518, T. Bili 0.3 Lipase 99  Lactic acid 2.27  Pt will be admitted for chest pain, Copd exacerbation, Hcap, and abnormal liver function       Subjective:    Wanda James today is more awake.  Than even yesterday.  Pt  having difficulty with swallowing.  Family requests as much of medication as possible changed to iv.  Seems to have slight abdominal discomfort/ distension, + constipation.  Pt didn't have any diarrhea yesterday.  Denies fever, chills, brbpr.   No headache, No chest pain, No new weakness tingling or numbness, No Cough - SOB.    Assessment  & Plan :    Principal Problem:   Chest pain Active Problems:   COPD exacerbation (HCC)   HCAP (healthcare-associated pneumonia)   Cancer associated pain   Liver metastases (HCC)   Other constipation   Nausea & vomiting   AMS likley secondary to pain medication, hyperammonemia Start oxycodone '10mg'$  po q6h prn  Check CT brain => no acute process Check ABG=> co2 wnl Check ammonia level in AM Lactulose 30gm po x1 given (7/26) Lactulose 15gm po x1 (7/27) Lactulose PR (7/28) Check ammonia level in AM   N/v improving Xray Abdomen 2 view => unremarkable hemeoccult + (of gastric fluid), doubt ugi bleeding Cont phenergan Cont zofran Start pepcid '20mg'$  iv bid (7/26)  Abdominal distension/ pain Xray Abdomen  Difficulty swallowing Speech therapy consult  Right hilar mass. Liver metastasis. Suspected to have pneumonia with a work-up was suggestive of for large rightlungmass. Pulmonary consulted and felt that the patient should get liver biopsy to help with staging as well. IR consulted and the biopsy 07/26/2017. Results pending If the biopsy is negative patient will require bronchoscopy and biopsy as well. Currently no significant wheezing appreciated. 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 Suspected healthcare associated pneumonia. CT chest also suggested a possible postobstructive pneumonia versus atelectasis. Pulmonary consulted as well. Patient was on IV Solu-Medrol although I do not suspect that the patient does have any active COPD exacerbation and therefore will transition to oral prednisone.=>off MRSA PCR is negative therefore will discontinue vancomycin. Blood cultures so far negative as well. Continue cefepime for now and will likely transition her to oral Augmentin for the same. Monitor other recommendation from pulmonary. History of COPD continue home inhalers. Radiation oncology consulted , appreciate input  Chest pain, Chronic lower back pain Anxiety. Tramadol=> ineffective Percocet => mildly effective Continue Dilaudid MS contin '30mg'$  po bid Currently significant anxious and in distress, PRN Ativan ordered. If continues to have significant anxiety and difficult control pain and will consult palliative care for input. Currently do not have a diagnosis and therefore do not have a plan of care regarding her cancer and therefore will hold off on palliative care consultation for goals of care. DC MS Contin DC Dilaudid Oxycodone '10mg'$  po q6h prn   History of renal cell carcinoma S/P left renal nephrectomy. Avoid nephrotoxic agent. Monitor.  Active smoker. More than 40 years of history. Nicotine patch.  Type 2 diabetes mellitus. Continue sliding scale insulin.  Severe protein calorie malnutrition Start prostat 81m po bid (7/25)  Anxiety Start lexapo '5mg'$  po qday(7/25)  Diet: carb modified diet DVT Prophylaxis:subcutaneous Heparin  Advance goals of care discussion:full code  Family Communication:family was present at bedside, at the  time of interview. The pt provided permission to discuss medical plan with the family. Opportunity was given to ask question and all questions were answered satisfactorily.   Disposition: Discharge to be determined.  Consultants:PCCM, IR, oncology Procedures:Liver biopsy  Anti-infectives (From admission, onward)   Start     Dose/Rate Route Frequency Ordered Stop   07/24/17 2200  vancomycin (VANCOCIN) IVPB 750 mg/150 ml premix  Status:  Discontinued     750 mg 150 mL/hr over 60 Minutes Intravenous Every 24 hours 07/16/2017 2334 07/25/17 1327   07/24/17 0600  ceFEPIme (MAXIPIME) 1 g in sodium chloride 0.9 % 100 mL IVPB     1 g 200 mL/hr over 30 Minutes Intravenous Every 8 hours 07/15/2017 2322 08/01/17 0559   07/07/2017 2145  vancomycin (VANCOCIN) IVPB 1000 mg/200 mL premix     1,000 mg 200 mL/hr over 60 Minutes Intravenous  Once 07/22/2017 2143 07/25/2017 2353   07/05/2017 2145  ceFEPIme (MAXIPIME) 2 g in sodium chloride 0.9 % 100 mL IVPB     2 g 200 mL/hr over 30 Minutes Intravenous  Once 07/18/2017 2143 07/12/2017 2230        Objective:   Vitals:   07/30/17 2030 07/30/17 2249 07/31/17 0423 07/31/17 0831  BP:  117/69 117/77   Pulse:  92 100   Resp:  20 20   Temp:  98.8 F (37.1 C) 98.8 F (37.1 C)   TempSrc:   Oral   SpO2: 96% 97% 91% 90%  Weight:      Height:        Wt Readings from Last 3 Encounters:  07/24/17 50.7 kg (111 lb 12.4 oz)  06/26/17 53.4 kg (117 lb 12.8 oz)  08/19/16 53.5 kg (118 lb)     Intake/Output Summary (Last 24 hours) at 07/31/2017 1330 Last data filed at 07/31/2017 0600 Gross per 24 hour  Intake 1675.42 ml  Output -  Net 1675.42 ml     Physical Exam  Awake Alert, Oriented X 3, No new F.N deficits, Normal affect [place].AT,PERRAL Supple Neck,No JVD, No cervical lymphadenopathy appriciated.  Symmetrical Chest wall movement, Good air movement bilaterally, CTAB RRR,No Gallops,Rubs or new Murmurs, No Parasternal Heave +ve B.Sounds, Abd Soft,  slight distention, no tenderness, No organomegaly appriciated, No rebound - guarding or rigidity. No Cyanosis, Clubbing or edema, No new Rash or bruise       Data Review:    CBC Recent Labs  Lab 07/27/17 0504 07/28/17 0434 07/29/17 0433 07/29/17 1653 07/30/17 0434 07/31/17 0349  WBC 7.6 8.7 13.8* 11.8* 11.4* 14.2*  HGB 12.1 12.0 13.0 11.8* 11.4* 11.1*  HCT 38.1 38.6 41.6 38.1 36.1 35.8*  PLT 290 281 309 275 253 232  MCV 90.7 91.7 92.7 93.2 92.3 93.0  MCH 28.8 28.5 29.0 28.9 29.2 28.8  MCHC 31.8 31.1 31.3 31.0 31.6 31.0  RDW 16.0* 16.0* 16.0* 15.8* 15.9* 16.2*  LYMPHSABS 0.6*  --   --   --   --   --   MONOABS 0.0*  --   --   --   --   --   EOSABS 0.0  --   --   --   --   --   BASOSABS 0.0  --   --   --   --   --     Chemistries  Recent Labs  Lab 07/25/17 0445  07/27/17 0504 07/28/17 0434 07/29/17 0433 07/30/17 0434 07/31/17 0349  NA 138   < > 139 138 135 139 142  K 4.0   < > 4.7 5.0 5.2* 4.7 4.8  CL 104   < > 103 101 100 102 106  CO2 24   < > '29 30 26 27 25  '$ GLUCOSE 129*   < >  101* 114* 77 95 88  BUN 14   < > 23 29* 41* 43* 33*  CREATININE 0.64   < > 0.54 0.63 0.71 0.68 0.56  CALCIUM 8.5*   < > 8.5* 8.6* 8.8* 8.6* 8.5*  MG 1.8  --  2.3  --   --   --   --   AST 132*   < > 118* 121* 127* 124* 115*  ALT 105*   < > 94* 95* 104* 99* 86*  ALKPHOS 417*   < > 470* 466* 528* 509* 483*  BILITOT 0.7   < > 0.8 1.0 1.3* 1.8* 2.2*   < > = values in this interval not displayed.   ------------------------------------------------------------------------------------------------------------------ No results for input(s): CHOL, HDL, LDLCALC, TRIG, CHOLHDL, LDLDIRECT in the last 72 hours.  Lab Results  Component Value Date   HGBA1C 6.8 (H) 06/28/2017   ------------------------------------------------------------------------------------------------------------------ No results for input(s): TSH, T4TOTAL, T3FREE, THYROIDAB in the last 72 hours.  Invalid input(s):  FREET3 ------------------------------------------------------------------------------------------------------------------ No results for input(s): VITAMINB12, FOLATE, FERRITIN, TIBC, IRON, RETICCTPCT in the last 72 hours.  Coagulation profile Recent Labs  Lab 07/25/17 1124  INR 1.23    No results for input(s): DDIMER in the last 72 hours.  Cardiac Enzymes Recent Labs  Lab 07/29/17 1328  TROPONINI <0.03   ------------------------------------------------------------------------------------------------------------------    Component Value Date/Time   BNP 25.0 06/26/2017 1506    Inpatient Medications  Scheduled Meds: . aspirin EC  325 mg Oral Daily  . dexamethasone  4 mg Oral Daily  . diclofenac sodium  4 g Topical TID  . enoxaparin (LOVENOX) injection  40 mg Subcutaneous Q24H  . escitalopram  5 mg Oral Daily  . feeding supplement (ENSURE ENLIVE)  237 mL Oral BID BM  . feeding supplement (PRO-STAT SUGAR FREE 64)  30 mL Oral BID  . ipratropium-albuterol  3 mL Nebulization TID  . lactulose  300 mL Rectal Once  . lipase/protease/amylase  12,000 Units Oral TID AC  . mouth rinse  15 mL Mouth Rinse BID  . mometasone-formoterol  2 puff Inhalation BID  . multivitamin  1 tablet Oral Daily  . nicotine  7 mg Transdermal Daily  . polyethylene glycol  17 g Oral Daily  . senna-docusate  2 tablet Oral BID  . tiotropium  18 mcg Inhalation Daily   Continuous Infusions: . ceFEPime (MAXIPIME) IV Stopped (07/31/17 5277)  . dextrose 5 % and 0.9 % NaCl with KCl 20 mEq/L    . famotidine (PEPCID) IV 20 mg (07/31/17 1002)   PRN Meds:.albuterol, LORazepam, morphine injection, morphine CONCENTRATE, ondansetron (ZOFRAN) IV, promethazine, zolpidem  Micro Results Recent Results (from the past 240 hour(s))  Culture, blood (routine x 2) Call MD if unable to obtain prior to antibiotics being given     Status: None   Collection Time: 07/05/2017 11:59 PM  Result Value Ref Range Status   Specimen  Description   Final    BLOOD LEFT ANTECUBITAL Performed at [facility] [address] [place], [place] [address]    Special Requests   Final    BOTTLES DRAWN AEROBIC AND ANAEROBIC Blood Culture results may not be optimal due to an excessive volume of blood received in culture bottles Performed at [facility] [address] [place], [place] [address]    Culture   Final    NO GROWTH 5 DAYS Performed at [facility], [place] [address] [place],  [address]    Report Status  07/29/2017 FINAL  Final  Culture, blood (routine x 2) Call MD if unable to obtain prior to antibiotics being given     Status: None   Collection Time: 07/25/17  4:45 AM  Result Value Ref Range Status   Specimen Description   Final    BLOOD RIGHT ANTECUBITAL Performed at [facility] [address], [place], [place] [address]    Special Requests   Final    BOTTLES DRAWN AEROBIC ONLY Blood Culture adequate volume Performed at [facility] [address] [place], [place] [address]    Culture   Final    NO GROWTH 5 DAYS Performed at [facility], [place] [address] [place], [place] [address]    Report Status 07/30/2017 FINAL  Final  MRSA PCR Screening     Status: None   Collection Time: 07/25/17  8:04 AM  Result Value Ref Range Status   MRSA by PCR NEGATIVE NEGATIVE Final    Comment:        The GeneXpert MRSA Assay (FDA approved for NASAL specimens only), is one component of a comprehensive MRSA colonization surveillance program. It is not intended to diagnose MRSA infection nor to guide or monitor treatment for MRSA infections. Performed at [facility], [place] [address], [place], [place] [address]   Gastrointestinal Panel by PCR , Stool     Status: Abnormal   Collection Time: 07/29/17  4:45 PM  Result Value Ref Range Status   Campylobacter species NOT DETECTED NOT DETECTED Final   Plesimonas  shigelloides NOT DETECTED NOT DETECTED Final   Salmonella species NOT DETECTED NOT DETECTED Final   Yersinia enterocolitica NOT DETECTED NOT DETECTED Final   Vibrio species NOT DETECTED NOT DETECTED Final   Vibrio cholerae NOT DETECTED NOT DETECTED Final   Enteroaggregative E coli (EAEC) NOT DETECTED NOT DETECTED Final   Enteropathogenic E coli (EPEC) DETECTED (A) NOT DETECTED Final    Comment: RESULT CALLED TO, READ BACK BY AND VERIFIED WITH: DANA GUNDLACH 07/30/17 @ 1509  [facility]    Enterotoxigenic E coli (ETEC) NOT DETECTED NOT DETECTED Final   Shiga like toxin producing E coli (STEC) NOT DETECTED NOT DETECTED Final   Shigella/Enteroinvasive E coli (EIEC) NOT DETECTED NOT DETECTED Final   Cryptosporidium NOT DETECTED NOT DETECTED Final   Cyclospora cayetanensis NOT DETECTED NOT DETECTED Final   Entamoeba histolytica NOT DETECTED NOT DETECTED Final   Giardia lamblia NOT DETECTED NOT DETECTED Final   Adenovirus F40/41 NOT DETECTED NOT DETECTED Final   Astrovirus NOT DETECTED NOT DETECTED Final   Norovirus GI/GII NOT DETECTED NOT DETECTED Final   Rotavirus A NOT DETECTED NOT DETECTED Final   Sapovirus (I, II, IV, and V) NOT DETECTED NOT DETECTED Final    Comment: Performed at [facility], [address], [place], [place] [address]    Radiology Reports Ct Abdomen Pelvis Wo Contrast  Result Date: 08/01/2017 CLINICAL DATA:  Mid chest and back pain. Patient was recently treated for pneumonia and has L4 and L5 fractures coughing. EXAM: CT CHEST, ABDOMEN AND PELVIS WITHOUT CONTRAST TECHNIQUE: Multidetector CT imaging of the chest, abdomen and pelvis was performed following the standard protocol without IV contrast. COMPARISON:  CXR from the same day, 06/29/2017 CXR and 01/31/2015 CT AP FINDINGS: CT CHEST FINDINGS Cardiovascular: Conventional branch pattern of the great vessels with atherosclerosis of the proximal great vessels. Mild atherosclerosis of the nonaneurysmal thoracic aorta  measuring up to 3.3 cm along the ascending portion.  Nondilated main pulmonary artery. Heart size is normal without pericardial effusion. Minimal coronary arteriosclerosis along the distal LAD. Mediastinum/Nodes: Mediastinal adenopathy is identified with largest lymph node right lower paratracheal measuring 2.2 cm in short axis. A 1.1 cm AP window lymph node is also noted. The trachea and mainstem bronchi are patent. Occlusion of the right lower lobe bronchus and branch vessels is noted. Lungs/Pleura: There is a confluent area of partially atelectatic/partially collapsed right lower lobe with displacement of the major fissure and inferior displacement of the right hilum from volume loss. This is likely related to obstruction of right lower lobe pulmonary bronchi possibly from mucus inspissation given somewhat tubular hypodensity new noted within the visualized bronchi. Alternatively an endobronchial lesion is not excluded or unlikely an extrinsic infrahilar mass causing extrinsic impression given the subtle tubular soft tissue opacities noted within the expected bronchi. Musculoskeletal: Superior endplate Schmorl's node of T10. No aggressive osseous lesions. CT ABDOMEN PELVIS FINDINGS Hepatobiliary: Inhomogeneous masslike hypodensities are noted of the liver concerning for liver lesions possibly metastatic given findings in the chest. No biliary dilatation is identified. The gallbladder is unremarkable. Pancreas: Normal Spleen: Normal size spleen without focal mass. Adrenals/Urinary Tract: Status post left nephrectomy. Right adrenal gland and kidney are nonacute with compensatory hypertrophy of the right kidney. Punctate renal pelvic calcifications seen on the right possibly vascular or a nonobstructing calculus. Left adrenal gland is not well visualized and may be surgically absent as well. Nodularity in the expected location of the adrenal gland is felt to represent partial volume averaging of the tail of the  pancreas. Stomach/Bowel: The stomach is decompressed in appearance. Normal small bowel rotation without obstruction or inflammation. The distal and terminal ileum are normal. Normal appearing appendix is noted. Increased stool burden within the colon compatible constipation. No large bowel inflammation. Partial colectomy on the left with sutures noted in the region of the rectosigmoid. Vascular/Lymphatic: Moderate to marked atherosclerosis of the abdominal aorta and branch vessels. Small retroperitoneal lymph nodes are identified without pathologic enlargement. Reproductive: Hysterectomy.  No adnexal mass. Other: No free air nor free fluid. Musculoskeletal: Degenerative disc disease L1 through L4. No aggressive osseous lesions identified. IMPRESSION: 1. Partial right lower lobe collapse/atelectasis with displacement of the right major fissure and caudal displacement of the right hilum. Differential considerations for this finding may be secondary to mucous impaction. A right infrahilar mass or endobronchial lesion causing the atelectasis are not entirely excluded. Mediastinal lymphadenopathy is also noted concerning for possible metastatic disease though reactive lymph nodes might also account for this. 2. Subtle hypodensity masslike abnormalities of the unenhanced liver worrisome for possible metastatic disease in light of the pulmonary findings. CT or MRI with IV contrast would be preferable for further assessment. 3. Left nephrectomy. Compensatory hypertrophy of the right kidney with nonobstructing interpolar calculus possibly vascular. 4. Partial left colectomy. 5. Moderate atherosclerosis of the abdominal aorta and branch vessels. Electronically Signed   By: Ashley Royalty M.D.   On: 08/02/2017 21:33   Dg Chest 2 View  Result Date: 08/02/2017 CLINICAL DATA:  Chest pain EXAM: CHEST - 2 VIEW COMPARISON:  June 29, 2017 FINDINGS: There is extensive right lower lobe airspace consolidation consistent with pneumonia.  Lungs elsewhere clear. Heart size and pulmonary vascularity are normal. No adenopathy. There is an old healed fracture of the lateral right sixth rib. There is aortic atherosclerosis. IMPRESSION: Extensive consolidation right lower lobe consistent with pneumonia. Lungs elsewhere clear. Heart size normal. There is aortic atherosclerosis. Aortic Atherosclerosis (ICD10-I70.0). Electronically  Signed   By: Lowella Grip III M.D.   On: 07/11/2017 20:57   Ct Head Wo Contrast  Result Date: 07/29/2017 CLINICAL DATA:  Headache and altered level of consciousness. History of lung and renal cell carcinoma. EXAM: CT HEAD WITHOUT CONTRAST TECHNIQUE: Contiguous axial images were obtained from the base of the skull through the vertex without intravenous contrast. COMPARISON:  Brain MRI 07/26/2017.  Head CT scan 05/29/2008. FINDINGS: Brain: Encephalomalacia in the parietal and occipital lobes is identified as seen on the comparison MRI. No evidence of acute intracranial abnormality including hemorrhage, infarct, mass lesion, midline shift or abnormal extra-axial fluid collection. No hydrocephalus or pneumocephalus. Vascular: Atherosclerosis is noted. Skull: Intact.  No focal lesion is identified. Sinuses/Orbits: Status post lens extraction bilaterally. No acute finding. Other: None. IMPRESSION: No acute abnormality. Areas of encephalomalacia in the parietal and occipital lobes bilaterally as seen on prior MRI and may be due to remote trauma or reversible encephalopathy. Atherosclerosis. Electronically Signed   By: Inge Rise M.D.   On: 07/29/2017 12:24   Ct Chest Wo Contrast  Result Date: 07/14/2017 CLINICAL DATA:  Mid chest and back pain. Patient was recently treated for pneumonia and has L4 and L5 fractures coughing. EXAM: CT CHEST, ABDOMEN AND PELVIS WITHOUT CONTRAST TECHNIQUE: Multidetector CT imaging of the chest, abdomen and pelvis was performed following the standard protocol without IV contrast. COMPARISON:   CXR from the same day, 06/29/2017 CXR and 01/31/2015 CT AP FINDINGS: CT CHEST FINDINGS Cardiovascular: Conventional branch pattern of the great vessels with atherosclerosis of the proximal great vessels. Mild atherosclerosis of the nonaneurysmal thoracic aorta measuring up to 3.3 cm along the ascending portion. Nondilated main pulmonary artery. Heart size is normal without pericardial effusion. Minimal coronary arteriosclerosis along the distal LAD. Mediastinum/Nodes: Mediastinal adenopathy is identified with largest lymph node right lower paratracheal measuring 2.2 cm in short axis. A 1.1 cm AP window lymph node is also noted. The trachea and mainstem bronchi are patent. Occlusion of the right lower lobe bronchus and branch vessels is noted. Lungs/Pleura: There is a confluent area of partially atelectatic/partially collapsed right lower lobe with displacement of the major fissure and inferior displacement of the right hilum from volume loss. This is likely related to obstruction of right lower lobe pulmonary bronchi possibly from mucus inspissation given somewhat tubular hypodensity new noted within the visualized bronchi. Alternatively an endobronchial lesion is not excluded or unlikely an extrinsic infrahilar mass causing extrinsic impression given the subtle tubular soft tissue opacities noted within the expected bronchi. Musculoskeletal: Superior endplate Schmorl's node of T10. No aggressive osseous lesions. CT ABDOMEN PELVIS FINDINGS Hepatobiliary: Inhomogeneous masslike hypodensities are noted of the liver concerning for liver lesions possibly metastatic given findings in the chest. No biliary dilatation is identified. The gallbladder is unremarkable. Pancreas: Normal Spleen: Normal size spleen without focal mass. Adrenals/Urinary Tract: Status post left nephrectomy. Right adrenal gland and kidney are nonacute with compensatory hypertrophy of the right kidney. Punctate renal pelvic calcifications seen on the  right possibly vascular or a nonobstructing calculus. Left adrenal gland is not well visualized and may be surgically absent as well. Nodularity in the expected location of the adrenal gland is felt to represent partial volume averaging of the tail of the pancreas. Stomach/Bowel: The stomach is decompressed in appearance. Normal small bowel rotation without obstruction or inflammation. The distal and terminal ileum are normal. Normal appearing appendix is noted. Increased stool burden within the colon compatible constipation. No large bowel inflammation. Partial colectomy on  the left with sutures noted in the region of the rectosigmoid. Vascular/Lymphatic: Moderate to marked atherosclerosis of the abdominal aorta and branch vessels. Small retroperitoneal lymph nodes are identified without pathologic enlargement. Reproductive: Hysterectomy.  No adnexal mass. Other: No free air nor free fluid. Musculoskeletal: Degenerative disc disease L1 through L4. No aggressive osseous lesions identified. IMPRESSION: 1. Partial right lower lobe collapse/atelectasis with displacement of the right major fissure and caudal displacement of the right hilum. Differential considerations for this finding may be secondary to mucous impaction. A right infrahilar mass or endobronchial lesion causing the atelectasis are not entirely excluded. Mediastinal lymphadenopathy is also noted concerning for possible metastatic disease though reactive lymph nodes might also account for this. 2. Subtle hypodensity masslike abnormalities of the unenhanced liver worrisome for possible metastatic disease in light of the pulmonary findings. CT or MRI with IV contrast would be preferable for further assessment. 3. Left nephrectomy. Compensatory hypertrophy of the right kidney with nonobstructing interpolar calculus possibly vascular. 4. Partial left colectomy. 5. Moderate atherosclerosis of the abdominal aorta and branch vessels. Electronically Signed   By:  Ashley Royalty M.D.   On: 07/21/2017 21:33   Mr Jeri Cos ZD Contrast  Result Date: 07/26/2017 CLINICAL DATA:  Non-small cell lung cancer, here for staging. History of type 1 Chiari malformation, stroke. EXAM: MRI HEAD WITHOUT AND WITH CONTRAST TECHNIQUE: Multiplanar, multiecho pulse sequences of the brain and surrounding structures were obtained without and with intravenous contrast. CONTRAST:  62m MULTIHANCE GADOBENATE DIMEGLUMINE 529 MG/ML IV SOLN COMPARISON:  MRI head May 29, 2008 FINDINGS: Sequences vary from moderate to severely motion degraded. INTRACRANIAL CONTENTS: No reduced diffusion to suggest acute ischemia or hypercellular tumor. No susceptibility artifact to suggest hemorrhage. Biparietal bilateral occipital lobe encephalomalacia. No overall parenchymal brain volume loss for age. A few scattered subcentimeter supratentorial white matter FLAIR T2 hyperintensities compatible with chronic small vessel ischemic changes. 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 No midline shift, mass effect or masses. No abnormal extra-axial fluid collections. Low-lying cerebellar tonsils difficult to quantify due to motion. VASCULAR: Normal major intracranial vascular flow voids present at skull base. SKULL AND UPPER CERVICAL SPINE: No abnormal sellar expansion. Heterogeneous bone marrow signal. Craniocervical junction maintained. SINUSES/ORBITS: The mastoid air-cells and included paranasal sinuses are well-aerated.The included ocular globes and orbital contents are non-suspicious. Status post bilateral ocular lens implants. OTHER: None. IMPRESSION: 1. Moderate to severely motion degraded examination. 2. No identified intracranial metastasis, however motion decreases sensitivity for small metastasis or leptomeningeal disease. If continued concern for metastatic disease, sedation may be warranted. 3. Nonspecific heterogeneous bone marrow signal, infiltrative tumor not excluded 4. 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 Electronically Signed   By: CElon AlasM.D.   On: 07/26/2017 19:35   Mr Abdomen W Wo Contrast  Result Date: 07/24/2017 CLINICAL DATA:  LEFT lower lobe collapse difficulty breathing. Indeterminate hepatic lesions identified on noncontrast CT. History of renal cell carcinoma. LEFT nephrectomy EXAM: MRI ABDOMEN WITHOUT AND WITH CONTRAST TECHNIQUE: Multiplanar multisequence MR imaging of the abdomen was performed both before and after the administration of intravenous contrast. CONTRAST:  140mMULTIHANCE GADOBENATE DIMEGLUMINE 529 MG/ML IV SOLN COMPARISON:  CT 07/25/2017 FINDINGS: Lower chest: A complete collapse of the RIGHT lower lobe with obstruction of the RIGHT lower lobe bronchus. On coronal imaging there is masslike lesion surrounding the bronchus intermedius measuring 6.4 by 4.1 cm (image 16/6 per Hepatobiliary: Too numerous to count round enhancing lesions extensively involving the LEFT RIGHT  hepatic lobe. Lesions range in size from 10 mm to 33 mm. Approximately 100 lesions. Largest lesion occupies the LEFT hepatic lobe measures 6.4 cm large example lesion in the RIGHT hepatic lobe measures 3.6 cm in the dome (image 27/9 0 point. There is no biliary duct dilatation. The gallbladder and common bile duct are normal. Pancreas: Pancreas is normal. No ductal dilatation. No pancreatic inflammation. Spleen: Normal spleen Adrenals/urinary tract: Adrenal glands are normal. Post LEFT nephrectomy. Compensatory hypertrophy of the RIGHT kidney. No RIGHT renal lesion. Stomach/Bowel: Stomach and limited view of the bowel is unremarkable. Vascular/Lymphatic: Normal abdominal aorta. No significant adenopathy Reproductive: Other: No free fluid. Musculoskeletal: No aggressive osseous lesion. IMPRESSION: 1. Large RIGHT hilar mass surrounds the bronchus intermedius and extends into the mediastinum consistent with primary malignancy versus metastatic lesion. This  lesion obstructs the RIGHT lower bronchus with complete atelectasis of the RIGHT lower lobe. 2. 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 Lesions reached near confluence and and number approximately 100. 3. LEFT nephrectomy.  Normal hypertrophy RIGHT kidney. 4. No bony metastasis identified Electronically Signed   By: Suzy Bouchard M.D.   On: 07/24/2017 15:28   US Biopsy (liver)  Result Date: 07/26/2017 INDICATION: Large right hilar mass, innumerable liver metastases EXAM: ULTRASOUND GUIDED CORE BIOPSY OF RIGHT LIVER LESION MEDICATIONS: 1% lidocaine local ANESTHESIA/SEDATION: Versed 1.'5mg'$  IV; Fentanyl 8mg IV; Moderate Sedation Time:  10 minutes The patient was continuously monitored during the procedure by the interventional radiology nurse under my direct supervision. FLUOROSCOPY TIME:  Fluoroscopy Time: None. COMPLICATIONS: None immediate. PROCEDURE: The procedure, risks, benefits, and alternatives were explained to the patient. Questions regarding the procedure were encouraged and answered. The patient understands and consents to the procedure. Previous imaging reviewed. Preliminary ultrasound performed. A right hepatic lesion was demonstrated along the right subcostal margin adjacent to the gallbladder. Overlying skin marked. Under sterile conditions and local anesthesia, a 17 gauge 6.8 cm access needle was advanced to the lesion. Needle position confirmed with ultrasound. 18 gauge core biopsies obtained. Images obtained for documentation. No immediate complication. Patient tolerated the biopsy well. FINDINGS: Imaging confirms needle placed in the right hepatic lesion for core biopsy. IMPRESSION: Successful ultrasound right hepatic mass 18 gauge core biopsy Electronically Signed   By: MJerilynn Mages  Shick M.D.   On: 07/26/2017 16:34   Dg Abd 2 Views  Result Date: 07/29/2017 CLINICAL DATA:  Hemoptysis, abdominal pain. EXAM: ABDOMEN - 2 VIEW COMPARISON:  None. FINDINGS: The bowel gas pattern is  normal. There is no evidence of free air. No radio-opaque calculi or other significant radiographic abnormality is seen. IMPRESSION: No evidence of bowel obstruction or ileus. No definite pneumoperitoneum is noted. Electronically Signed   By: JMarijo Conception M.D.   On: 07/29/2017 16:40    Time Spent in minutes  30   JJani GravelM.D on 07/31/2017 at 1:30 PM  Between 7am to 7pm - Pager - [phone] After 7pm go to [email or web address] - password [facility] -  Office  [phone]

## 2017-07-31 NOTE — Progress Notes (Deleted)
Md notified.

## 2017-08-01 ENCOUNTER — Ambulatory Visit: Payer: BLUE CROSS/BLUE SHIELD

## 2017-08-01 ENCOUNTER — Inpatient Hospital Stay (HOSPITAL_COMMUNITY): Payer: BLUE CROSS/BLUE SHIELD

## 2017-08-01 ENCOUNTER — Inpatient Hospital Stay: Payer: BLUE CROSS/BLUE SHIELD | Admitting: Hematology and Oncology

## 2017-08-01 ENCOUNTER — Ambulatory Visit
Admit: 2017-08-01 | Discharge: 2017-08-01 | Disposition: A | Discharge status: Home / Self Care | Payer: BLUE CROSS/BLUE SHIELD | Attending: Radiation Oncology | Admitting: Radiation Oncology

## 2017-08-01 ENCOUNTER — Telehealth: Payer: Self-pay

## 2017-08-01 DIAGNOSIS — R948 Abnormal results of function studies of other organs and systems: Secondary | ICD-10-CM

## 2017-08-01 DIAGNOSIS — K5900 Constipation, unspecified: Secondary | ICD-10-CM

## 2017-08-01 DIAGNOSIS — J449 Chronic obstructive pulmonary disease, unspecified: Secondary | ICD-10-CM

## 2017-08-01 DIAGNOSIS — R079 Chest pain, unspecified: Secondary | ICD-10-CM

## 2017-08-01 DIAGNOSIS — C3431 Malignant neoplasm of lower lobe, right bronchus or lung: Secondary | ICD-10-CM

## 2017-08-01 DIAGNOSIS — I2699 Other pulmonary embolism without acute cor pulmonale: Secondary | ICD-10-CM

## 2017-08-01 DIAGNOSIS — R778 Other specified abnormalities of plasma proteins: Secondary | ICD-10-CM

## 2017-08-01 DIAGNOSIS — R0902 Hypoxemia: Secondary | ICD-10-CM

## 2017-08-01 DIAGNOSIS — E43 Unspecified severe protein-calorie malnutrition: Secondary | ICD-10-CM

## 2017-08-01 DIAGNOSIS — R1310 Dysphagia, unspecified: Secondary | ICD-10-CM

## 2017-08-01 DIAGNOSIS — F411 Generalized anxiety disorder: Secondary | ICD-10-CM

## 2017-08-01 DIAGNOSIS — R7989 Other specified abnormal findings of blood chemistry: Secondary | ICD-10-CM

## 2017-08-01 DIAGNOSIS — J9601 Acute respiratory failure with hypoxia: Secondary | ICD-10-CM

## 2017-08-01 LAB — COMPREHENSIVE METABOLIC PANEL
ALK PHOS: 406 U/L — AB (ref 38–126)
ALT: 73 U/L — ABNORMAL HIGH (ref 0–44)
ANION GAP: 9 (ref 5–15)
AST: 110 U/L — ABNORMAL HIGH (ref 15–41)
Albumin: 2 g/dL — ABNORMAL LOW (ref 3.5–5.0)
BILIRUBIN TOTAL: 2.3 mg/dL — AB (ref 0.3–1.2)
BUN: 23 mg/dL (ref 8–23)
CALCIUM: 8.2 mg/dL — AB (ref 8.9–10.3)
CO2: 24 mmol/L (ref 22–32)
Chloride: 109 mmol/L (ref 98–111)
Creatinine, Ser: 0.5 mg/dL (ref 0.44–1.00)
Glucose, Bld: 113 mg/dL — ABNORMAL HIGH (ref 70–99)
POTASSIUM: 4.2 mmol/L (ref 3.5–5.1)
Sodium: 142 mmol/L (ref 135–145)
TOTAL PROTEIN: 5 g/dL — AB (ref 6.5–8.1)

## 2017-08-01 LAB — GLUCOSE, CAPILLARY: Glucose-Capillary: 154 mg/dL — ABNORMAL HIGH (ref 70–99)

## 2017-08-01 LAB — CBC
HEMATOCRIT: 35.4 % — AB (ref 36.0–46.0)
HEMOGLOBIN: 11.1 g/dL — AB (ref 12.0–15.0)
MCH: 29.3 pg (ref 26.0–34.0)
MCHC: 31.4 g/dL (ref 30.0–36.0)
MCV: 93.4 fL (ref 78.0–100.0)
Platelets: 204 10*3/uL (ref 150–400)
RBC: 3.79 MIL/uL — ABNORMAL LOW (ref 3.87–5.11)
RDW: 16.2 % — AB (ref 11.5–15.5)
WBC: 13.7 10*3/uL — ABNORMAL HIGH (ref 4.0–10.5)

## 2017-08-01 LAB — TROPONIN I
TROPONIN I: 0.06 ng/mL — AB (ref <0.03)
Troponin I: 0.06 ng/mL (ref <0.03)

## 2017-08-01 LAB — ECHOCARDIOGRAM COMPLETE
HEIGHTINCHES: 62 in
Weight: 1887.14 oz

## 2017-08-01 LAB — AMMONIA: AMMONIA: 62 umol/L — AB (ref 9–35)

## 2017-08-01 LAB — HEPARIN LEVEL (UNFRACTIONATED): Heparin Unfractionated: 0.3 IU/mL (ref 0.30–0.70)

## 2017-08-01 MED ORDER — OSMOLITE 1.2 CAL PO LIQD
1000.0000 mL | ORAL | Status: DC
  Administered 2017-08-01 – 2017-08-02 (×2): 1000 mL

## 2017-08-01 MED ORDER — PHENOL 1.4 % MT LIQD
1.0000 | OROMUCOSAL | Status: DC | PRN
  Administered 2017-08-01: 1 via OROMUCOSAL
  Filled 2017-08-01: qty 177

## 2017-08-01 MED ORDER — IPRATROPIUM-ALBUTEROL 0.5-2.5 (3) MG/3ML IN SOLN
3.0000 mL | RESPIRATORY_TRACT | Status: DC
  Administered 2017-08-01 – 2017-08-06 (×32): 3 mL via RESPIRATORY_TRACT
  Filled 2017-08-01 (×32): qty 3

## 2017-08-01 MED ORDER — JEVITY 1.2 CAL PO LIQD: 1000.0000 mL | ORAL | Status: DC

## 2017-08-01 MED ORDER — FREE WATER
100.0000 mL | Status: DC
  Administered 2017-08-01 – 2017-08-06 (×29): 100 mL

## 2017-08-01 MED ORDER — HEPARIN (PORCINE) IN NACL 100-0.45 UNIT/ML-% IJ SOLN
900.0000 [IU]/h | INTRAMUSCULAR | Status: DC
  Administered 2017-08-01 – 2017-08-02 (×2): 900 [IU]/h via INTRAVENOUS
  Filled 2017-08-01 (×2): qty 250

## 2017-08-01 MED ORDER — ENOXAPARIN SODIUM 60 MG/0.6ML ~~LOC~~ SOLN
1.0000 mg/kg | Freq: Once | SUBCUTANEOUS | Status: DC
  Filled 2017-08-01: qty 0.55

## 2017-08-01 MED ORDER — BUDESONIDE 0.25 MG/2ML IN SUSP
0.2500 mg | Freq: Two times a day (BID) | RESPIRATORY_TRACT | Status: DC
Start: 2017-08-01 — End: 2017-08-06
  Administered 2017-08-01 – 2017-08-06 (×11): 0.25 mg via RESPIRATORY_TRACT
  Filled 2017-08-01 (×11): qty 2

## 2017-08-01 MED ORDER — NAPHAZOLINE-GLYCERIN 0.012-0.2 % OP SOLN
1.0000 [drp] | Freq: Four times a day (QID) | OPHTHALMIC | Status: DC | PRN
  Administered 2017-08-01: 1 [drp] via OPHTHALMIC
  Filled 2017-08-01: qty 15

## 2017-08-01 MED ORDER — LORAZEPAM 2 MG/ML IJ SOLN
0.5000 mg | Freq: Four times a day (QID) | INTRAMUSCULAR | Status: DC | PRN
  Administered 2017-08-01 – 2017-08-02 (×3): 0.5 mg via INTRAVENOUS
  Filled 2017-08-01 (×3): qty 1

## 2017-08-01 NOTE — Progress Notes (Signed)
Bilateral lower extremity venous duplex has been completed. Negative for DVT.  08/01/17 1:39 PM Wanda James RVT

## 2017-08-01 NOTE — Assessment & Plan Note (Addendum)
Rapid deterioration to 15LNC aod 08/01/17  in this patient with new dx of small cell lung cacner. VQ intermediate prob 07/31/17 with trop leak + but RV function and Duplex LE - normal  RLL collapse on CXR 07/31/17  Started empiric hepaerin IV 08/01/16 (duplex le negative x1)  On 08/02/17 - hypoxemia improved to [facility]. Clincially PE (intermdiate prob) + RLL collapse + mediastinal compression as cause for hypoxemia. If effusion  Pleural righ side - small per CT at admit  Plan Get [place] right chest and if effusion is signfiicant - do thora (ordered for IR) in attempt to improve hypoxemia although Continue  empiric IV heparin gtt (dc order for Rx dose lovenox Continue  nebs Contiue o2 Agree with XRT ti help improve airway compression   ECOG is 4. Full code noted

## 2017-08-01 NOTE — Progress Notes (Signed)
Daily Progress Note   Patient Name: Wanda James       Date: 08/01/2017 DOB: 03/16/1955  Age: 62 y.o. MRN#: [id number] Attending Physician: Jani Gravel, MD Primary Care Physician: Gaye Alken, PA-C Admit Date: 07/16/2017  Reason for Consultation/Follow-up: Establishing goals of care  Subjective:  patient is awake, now in step down ICU at [facility] She is trying to participate with speech therapy She has itching and tears in both eyes, which is chronic for her, she reportedly takes eye drops at home.  Son and daughter at bedside.    Length of Stay: 9  Current Medications: Scheduled Meds:  . aspirin EC  325 mg Oral Daily  . dexamethasone  4 mg Oral Daily  . diclofenac sodium  4 g Topical TID  . enoxaparin (LOVENOX) injection  1 mg/kg Subcutaneous Once  . escitalopram  5 mg Oral Daily  . feeding supplement (ENSURE ENLIVE)  237 mL Oral BID BM  . feeding supplement (PRO-STAT SUGAR FREE 64)  30 mL Oral BID  . ipratropium-albuterol  3 mL Nebulization TID  . lactulose  300 mL Rectal Once  . lipase/protease/amylase  12,000 Units Oral TID AC  . mouth rinse  15 mL Mouth Rinse BID  . mometasone-formoterol  2 puff Inhalation BID  . multivitamin  1 tablet Oral Daily  . nicotine  7 mg Transdermal Daily  . polyethylene glycol  17 g Oral Daily  . scopolamine  1 patch Transdermal Q72H  . senna-docusate  2 tablet Oral BID  . tiotropium  18 mcg Inhalation Daily    Continuous Infusions: . dextrose 5 % and 0.9 % NaCl with KCl 20 mEq/L 75 mL/hr at 07/31/17 1343  . famotidine (PEPCID) IV Stopped (07/31/17 2145)    PRN Meds: levalbuterol, LORazepam, morphine injection, naphazoline-glycerin, ondansetron (ZOFRAN) IV, promethazine, zolpidem  Physical Exam         Awake and interactive with  family and speech therapist In no distress No edema  Vital Signs: BP (!) 108/59   Pulse (!) 116   Temp 97.7 F (36.5 C) (Oral)   Resp (!) 22   Ht 5\' 2"  (1.575 m)   Wt 53.5 kg (117 lb 15.1 oz)   SpO2 92%   BMI 21.57 kg/m  SpO2: SpO2: 92 % O2 Device: O2 Device: High Flow  Nasal Cannula O2 Flow Rate: O2 Flow Rate (L/min): 15 L/min  Intake/output summary:   Intake/Output Summary (Last 24 hours) at 08/01/2017 1017 Last data filed at 08/01/2017 0700 Gross per 24 hour  Intake 1559.58 ml  Output 440 ml  Net 1119.58 ml   LBM: Last BM Date: 07/30/17 Baseline Weight: Weight: 51.3 kg (113 lb) Most recent weight: Weight: 53.5 kg (117 lb 15.1 oz)       Palliative Assessment/Data:    Flowsheet Rows     Most Recent Value  Intake Tab  Referral Department  Hospitalist  Unit at Time of Referral  Med/Surg Unit  Date Notified  07/24/17  Palliative Care Type  Not seen  Reason Not Seen  Consult cancelled  Reason for referral  Clarify Goals of Care  Date of Admission  07/26/2017  # of days IP prior to Palliative referral  1  Clinical Assessment  Psychosocial & Spiritual Assessment  Palliative Care Outcomes      Patient Active Problem List   Diagnosis Date Noted  . Elevated troponin 08/01/2017  . Hypoxia   . Abdominal pain   . Nausea & vomiting   . Malignant neoplasm of lower lobe of right lung ([place]) 07/27/2017  . Cancer associated pain   . Liver metastases ([place])   . Other constipation   . HCAP (healthcare-associated pneumonia) 07/17/2017  . CAP (community acquired pneumonia) 06/28/2017  . New onset type 2 diabetes mellitus ([place]) 06/28/2017  . Leukocytosis 06/28/2017  . COPD exacerbation ([place]) 06/26/2017  . Acute hypoxemic respiratory failure ([place]) 06/26/2017  . GERD (gastroesophageal reflux disease) 06/26/2017  . Tobacco abuse 06/26/2017  . COPD UNSPECIFIED 04/29/2009  . DYSLIPIDEMIA 03/10/2009  . 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 Chest pain 02/04/2009  . TOBACCO  ABUSE 01/23/2009  . DEPRESSION 01/23/2009    Palliative Care Assessment & Plan   Patient Profile:    Assessment: acute hypoxic resp failure Nausea, dysphagia R hilar mass Liver metastasis Post obstructive PNA Severe protein calorie malnutrition  Anxiety  History of smoking    Recommendations/Plan:   will add eye drops for symptom relief   SLP eval, Pulm consulted.   Appreciate med onc follow up, family awaiting further discussions with Dr Alvy Bimler later today  PMT will follow up on 08-02-17.    Code Status:    Code Status Orders  (From admission, onward)        Start     Ordered   07/31/17 2150  Limited resuscitation (code)  Continuous    Question Answer Comment  In the event of cardiac or respiratory ARREST: Initiate Code Blue, Call Rapid Response Yes   In the event of cardiac or respiratory ARREST: Perform CPR No   In the event of cardiac or respiratory ARREST: Perform Intubation/Mechanical Ventilation Yes   In the event of cardiac or respiratory ARREST: Use NIPPV/BiPAp only if indicated Yes   In the event of cardiac or respiratory ARREST: Administer ACLS medications if indicated Yes   In the event of cardiac or respiratory ARREST: Perform Defibrillation or Cardioversion if indicated Yes      07/31/17 2150    Code Status History    Date Active Date Inactive Code Status Order ID Comments User Context   07/31/2017 2142 07/31/2017 2150 Partial Code [id number]  Neila Gear, NP Inpatient   07/27/2017 2322 07/31/2017 2142 Full Code [id number]  Jani Gravel, MD ED   06/26/2017 1753 07/01/2017 1630 Full Code [id number]  Dessa Phi, DO Inpatient  Prognosis:   Unable to determine  Discharge Planning:  To Be Determined  Care plan was discussed with  Patient, son, daughter   Thank you for allowing the Palliative Medicine Team to assist in the care of this patient.   Time In: 10 Time Out: 10.15 Total Time 15 Prolonged Time Billed  no       Greater than  50%  of this time was spent counseling and coordinating care related to the above assessment and plan.  Loistine Chance, MD [phone]  Please contact Palliative Medicine Team phone at [phone] for questions and concerns.

## 2017-08-01 NOTE — Progress Notes (Signed)
  Echocardiogram 2D Echocardiogram has been performed.  Wanda James 08/01/2017, 12:15 PM

## 2017-08-01 NOTE — Progress Notes (Addendum)
Name: Wanda James MRN: [id number] DOB: 1955/05/15    ADMISSION DATE:  07/10/2017 CONSULTATION DATE:  7/21  REFERRING MD :  Nevada Crane  CHIEF COMPLAINT:  Abnormal CT chest   BRIEF PATIENT DESCRIPTION/HPI    brief This is a 62 year old female patient with a significant history of renal cell carcinoma status post prior left nephrectomy, tobacco abuse, prob COPD with oxygen dependence since recent admit for PNA at end of June. Presented to the emergency room with chief complaint of: Approximately 1 week history of worsening cough initially productive of sputum with black spots, shortness of breath, decreased p.o. appetite with shortness of breath particularly worse over the prior 2 days.    Recently discharged on the hospital in 6/23 2019 where she was admitted for acute acute exacerbation COPD and also pneumonia she was discharged home with prednisone taper, oxygen, and Levaquin. Review of imaging during that hospitalization did show right lower lobe airspace disease, this appeared to improve some on serial films prior to discharge.  She was seen once again by her primary care provider on 7/17 for worsening of symptoms at that time she was started on Biaxin, her symptoms have not improved and therefore she presented to the emergency room.   On presentation this time a CT of chest was obtained this demonstrated: Partial right lower lobe collapse/atelectasis with displacement of the right fissure, mediastinal adenopathy, subtle hypodense masslike abnormality of the liver.  She was admitted with a working diagnosis of recurrent pneumonia with possible lung mass.  Pulmonary has been asked to evaluate in regards to CT findings.    EVENTS CT chest 7/21: Partial right lower lobe collapse/atelectasis with displacement of the right major fissure and caudal displacement of the right hilum.  There is also mediastinal lymphadenopathy and a subtle hypodensity masslike area in the liver   7/21 - pccm  consult  7/22 - liver biopsy recommended and ccm signed off  7/23 - s/p liver biopsy  (MRI Bran negative for mets) - small lung - suggestive of primary  lung cacner  7/28 - intermediate prob for VQ    SUBJECTIVE/OVERNIGHT/INTERVAL HX 7/29 - pccm recalled. Per family - 2L Ramos x 1 month. Then 5LNC few days and overnigth 15LNC. VQ scan intermediate prob technically. 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 No calf swelling. Cannot get IV dye due to allergy. Maintains BP.  Has needed morphine for cancer related pain. Per RN -  Has refused XRT x 2 days due to nause and being "tired and dont know" per  VITAL SIGNS: Temp:  [97.7 F (36.5 C)-99.5 F (37.5 C)] 97.7 F (36.5 C) (07/29 0805) Pulse Rate:  [101-122] 116 (07/29 0600) Resp:  [15-28] 22 (07/29 0600) BP: (105-153)/(59-96) 108/59 (07/29 0600) SpO2:  [83 %-98 %] 92 % (07/29 0600) Weight:  [53.5 kg (117 lb 15.1 oz)] 53.5 kg (117 lb 15.1 oz) (07/28 2015)  PHYSICAL EXAMINATION:   General Appearance:    Thin, frail, cachectic, looks a bit moribund  Head:    Normocephalic, without obvious abnormality, atraumatic  Eyes:    PERRL - yes, conjunctiva/corneas - clear      Ears:    Normal external ear canals, both ears  Nose:   NG tube - yes with 15L [place]  Throat:  ETT TUBE - no , OG tube - no  Neck:   Supple,  No enlargement/tenderness/nodules     Lungs:     Clear to auscultation bilaterally, Mild tachypnea  Chest  wall:    No deformity  Heart:    S1 and S2 normal, no murmur, CVP - no.  Pressors - no  Abdomen:     Soft, no masses, no organomegaly  Genitalia:    Not done  Rectal:   not done  Extremities:   Extremities- intact . No homan. No unlateral redness     Skin:   Intact in exposed areas .     Neurologic:   Sedation - morphine prn -> RASS - -2     PULMONARY Recent Labs  Lab 07/29/17 1037 07/31/17 1916  PHART 7.377 7.396  PCO2ART 43.7 43.1  PO2ART 146* 70.1*  HCO3 25.1 25.6  O2SAT 98.4 92.2    CBC Recent Labs    Lab 07/31/17 0349 07/31/17 1906 08/01/17 0145  HGB 11.1* 11.3* 11.1*  HCT 35.8* 35.7* 35.4*  WBC 14.2* 14.5* 13.7*  PLT 232 266 204    COAGULATION Recent Labs  Lab 07/25/17 1124  INR 1.23    CARDIAC   Recent Labs  Lab 07/29/17 1328 07/31/17 1906 08/01/17 0145 08/01/17 0652  TROPONINI <0.03 0.04* 0.06* 0.06*   No results for input(s): PROBNP in the last 168 hours.   CHEMISTRY Recent Labs  Lab 07/27/17 0504  07/29/17 0433 07/30/17 0434 07/31/17 0349 07/31/17 1906 08/01/17 0145  NA 139   < > 135 139 142 143 142  K 4.7   < > 5.2* 4.7 4.8 4.3 4.2  CL 103   < > 100 102 106 108 109  CO2 29   < > 26 27 25 25 24   GLUCOSE 101*   < > 77 95 88 107* 113*  BUN 23   < > 41* 43* 33* 25* 23  CREATININE 0.54   < > 0.71 0.68 0.56 0.45 0.50  CALCIUM 8.5*   < > 8.8* 8.6* 8.5* 8.3* 8.2*  MG 2.3  --   --   --   --   --   --    < > = values in this interval not displayed.   Estimated Creatinine Clearance: 56.9 mL/min (by C-G formula based on SCr of 0.5 mg/dL).   LIVER Recent Labs  Lab 07/25/17 1124  07/29/17 0433 07/30/17 0434 07/31/17 0349 07/31/17 1906 08/01/17 0145  AST  --    < > 127* 124* 115* 115* 110*  ALT  --    < > 104* 99* 86* 79* 73*  ALKPHOS  --    < > 528* 509* 483* 406* 406*  BILITOT  --    < > 1.3* 1.8* 2.2* 2.3* 2.3*  PROT  --    < > 6.2* 5.5* 5.3* 5.0* 5.0*  ALBUMIN  --    < > 2.8* 2.4* 2.3* 2.1* 2.0*  INR 1.23  --   --   --   --   --   --    < > = values in this interval not displayed.     INFECTIOUS Recent Labs  Lab 07/26/17 0446  PROCALCITON 2.78     ENDOCRINE CBG (last 3)  No results for input(s): GLUCAP in the last 72 hours.       IMAGING x48h  - image(s) personally visualized  -   highlighted in bold Dg Abd 1 View  Result Date: 07/31/2017 CLINICAL DATA:  Abdominal pain. EXAM: ABDOMEN - 1 VIEW COMPARISON:  None. FINDINGS: The bowel gas pattern is normal. No evidence of bowel obstruction or pneumoperitoneum. Surgical clips  within the abdomen and  surgical sutures within the pelvis noted. No acute bony abnormalities identified. Vascular calcifications are present. IMPRESSION: No acute abnormality.  Unremarkable bowel gas pattern. Electronically Signed   By: Margarette Canada M.D.   On: 07/31/2017 15:15   Nm Pulmonary Perf And Vent  Result Date: 08/01/2017 CLINICAL DATA:  Lung cancer, elevated D-dimer, evaluate for PE EXAM: NUCLEAR MEDICINE VENTILATION - PERFUSION LUNG SCAN TECHNIQUE: Ventilation images were obtained in multiple projections using inhaled aerosol Tc-35m DTPA. Perfusion images were obtained in multiple projections after intravenous injection of Tc-48m-MAA. RADIOPHARMACEUTICALS:  28.3 mCi of Tc-32m DTPA aerosol inhalation and 3.9 mCi Tc22m-MAA IV COMPARISON:  Chest radiograph dated 07/31/2017. CT chest dated 07/18/2017. FINDINGS: Ventilation: Heterogeneous ventilation throughout the left lung and in the right upper lobe. No right lower lobe ventilation is present, corresponding to right lower lobe collapse on prior CT. Perfusion: Normal fusion in the right lung. Diminished/heterogeneous perfusion in the right upper and lower lobes. When correlating with recent chest radiograph, there is a diffuse alveolar process in the right upper lobe and left lung, favoring interstitial edema, less likely multifocal infection. Persistent right lower lobe collapse with a new small right pleural effusion. The radiographic appearance likely accounts for the heterogeneous perfusion. Technically speaking, although pulmonary embolism is not suspected, by definition this reflects an intermediate probability study. IMPRESSION: Markedly limited evaluation in the setting of known right lower lobe collapse and abnormal chest radiograph which favors interstitial edema or less likely multifocal infection. While pulmonary embolism is not suspected, by definition the study is intermediate probability for pulmonary embolism. In patients with abnormal  chest radiograph, CT with contrast is generally preferred for this reason if/when able. Electronically Signed   By: Julian Hy M.D.   On: 08/01/2017 00:08   Dg Chest Port 1 View  Result Date: 07/31/2017 CLINICAL DATA:  Hypoxia, cough EXAM: PORTABLE CHEST 1 VIEW COMPARISON:  07/22/2017 FINDINGS: Diffuse interstitial and alveolar opacities throughout the lungs, new since prior study, likely edema. Moderate to large right pleural effusion with right lower lobe atelectasis or infiltrate. Heart is borderline in size. Hyperinflation. No acute bony abnormality. IMPRESSION: Borderline heart size. New diffuse interstitial and alveolar opacities, favor edema. Moderate to large right effusion with right lower lobe atelectasis or infiltrate. Electronically Signed   By: Rolm Baptise M.D.   On: 07/31/2017 19:56   Acute hypoxemic respiratory failure (HCC) Rapid deterioration to 15LNC in this patient with new dx of small cell lung cacner. VQ intermediate prob 07/31/17 RLL collapse on CXR 07/31/17  Clinically - we are dealing with Acute PE in setting of lung cancer or worsening RLL collapse. Doubt effusion worsening based on CT 07/24/17  Plan Start empiric IV heparin gtt (dc order for Rx dose lovenox) to better ability to titrate - no bolus Get duplex LE chagne BD to all nebs Contiue o2 Get stat cxr and if needed US guided thora  Overaall if this is PE - would be a very high 30d mortality situation in setting of cancer. If this is worsening effusion - Pleurx for palliation can be done. If is worsening RLL collapse - too sick to bronch right now  ECOG is 4  High ly recommend  palliative approach    D/w DR Maudie Mercury   The patient is critically ill with multiple organ systems failure and requires high complexity decision making for assessment and support, frequent evaluation and titration of therapies, application of advanced monitoring technologies and extensive interpretation of multiple databases.    Critical Care  Time devoted to patient care services described in this note is  30  Minutes. This time reflects time of care of this signee Dr Brand Males. This critical care time does not reflect procedure time, or teaching time or supervisory time of PA/NP/Med student/Med Resident etc but could involve care discussion time    Dr. Brand Males, M.D., [facility].P Pulmonary and Critical Care Medicine Staff Physician [place] [facility] Pager: [phone], If no answer or between  15:00h - 7:00h: call [phone]  08/01/2017 11:12 AM        Dr. Brand Males, M.D., F.C.C.P Pulmonary and Critical Care Medicine Staff Physician, [place] Director - Interstitial Lung Disease  Program  Pulmonary [place] [facility] [place], [place], [address]  Pager: [phone], If no answer or between  15:00h - 7:00h: call [phone] Telephone: [phone]

## 2017-08-01 NOTE — Progress Notes (Addendum)
Asked by Dr. Maudie Mercury to check on pt after episode of hypoxia. On assessment, pt on non-rebreather sats 95%. ABG is unremarkable. Pt is still tachycardiac but does not appear to be in any acute distress at the moment. Wheezing and rhonchi heard throughout lung fields. Pt resting at moment of assessment but family states she has been in a lot of pain throughout the day. Discussed plan of care with family. At this time, husband would like to make pt partial code.   Hypoxia  - Continue on non-rebreather for now. Will try and change to [place] when appropriate. - Changed albuterol to xopenex d/t tachycardia  Positive D-dimer - VQ Scan ordered  Plan of care/Code status -Changed to partial code per husband's request. -Attempt to control pain with current pain regimen.   Lovey Newcomer, NP [facility] 7p-7a [phone]

## 2017-08-01 NOTE — Telephone Encounter (Signed)
I called and spoke to [place], Ms. Knoll's RN in the ICU/Stepdown until in [facility]. I inquired about if Ms. Franzel would be coming for radiation today. She spoke with Ms. Futch while I was on the phone with her and she declined to come for her radiation today. I have notified [facility] to make them aware.

## 2017-08-01 NOTE — Progress Notes (Signed)
ANTICOAGULATION CONSULT NOTE - Initial Consult  Pharmacy Consult for IV heparin Indication: pulmonary embolus  Patient Measurements: Height: 5\' 2"  (157.5 cm) Weight: 117 lb 15.1 oz (53.5 kg) IBW/kg (Calculated) : 50.1 Heparin Dosing Weight: 53.5  Medications: Infusions:  . famotidine (PEPCID) IV Stopped (08/01/17 1052)  . feeding supplement (OSMOLITE 1.2 CAL) 1,000 mL (08/01/17 1707)  . heparin 900 Units/hr (08/01/17 1249)    Assessment: 62 yo female admitted with CP with hx renal cell carcinoma now with suspected PE per CCM to start IV heparin per pharmacy dosing.  VQ scan - intermediate probability for PE Dopplers: negative for DVT  Heparin level 0.3, therapeutic on heparin 900 units/hr infusion No bleeding or complications noted.   Goal of Therapy:  Heparin level 0.3-0.7 units/ml Monitor platelets by anticoagulation protocol: Yes   Plan:  NO heparin bolus per MD Continue heparin IV infusion at 900 units/hr Recheck HL with AM labs to confirm therapeutic rate. Daily heparin level and CBC    Gretta Arab PharmD, BCPS Pager [phone] 08/01/2017 8:37 PM

## 2017-08-01 NOTE — Progress Notes (Signed)
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, pathology confirmed small cell cancer Mediastinal lymphadenopathy with occlusion of right lower lobe bronchus causing partial right lower lobe collapse MRI brain is negative for intracranial mets Radiation oncologist has been consulted; palliative radiation treatment to resume after extensive discussion with patient and family members I recommend we proceed with palliative radiation due to mediastinal lymphadenopathy and right lower lung collapse contributing to her respiratory failure Once radiation treatment is completed, we will start chemotherapy  Elevated liver enzymes and mental status change Due to diffuse liver involvement Ammonia level is elevated Continue supportive care  Chronic respiratory failure due to COPD, oxygen dependent Probable post-obstructive pneumonia Continue inhalers and oxygen along with antibiotics She is receiving aggressive oxygen support After extensive discussion with patient and family members, they agree for full resuscitation mode including the possibility of mechanical ventilator/intubation for respiratory support  Moderate to severe protein calorie malnutrition Dysphagia, high risk of aspiration Due to cancer We discussed the risk and benefits of feeding tube placement.  Ultimately, family members would like to try NG tube placement and feeding.  If she tolerates that and continues to be weak, we can consider permanent gastrostomy tube placement  Severe cancer pain Pain control is reasonably stable We discussed pain management with slow titration as needed  Chronic constipation I recommend scheduled Senokot/laxative to avoid severe constipation  CODE STATUS After extensive discussion with the husband, he is in agreement to  change her CODE STATUS to full code based on prior discussion  Goals of care discussion I had  extensive goals of care discussion with multiple family members today. After approximately 1 hour and 20 minutes of discussion in total face-to-face time Multiple family members would like her to receive aggressive care along with nutritional support through feeding tube, possibility of mechanical ventilation if she gets worse, palliative radiation therapy followed by possibility of palliative chemotherapy if she improves We discussed prognosis with or without treatment Due to her altered mental status, her husband is the dedicated healthcare power attorney  Discharge planning Unlikely over the next few weeks  Heath Lark, MD 08/01/2017  1:49 PM   Subjective:  The patient is intermittently alert but overall drowsy. She denies pain when I was able to talk to her. She missed her radiation appointment earlier today. Further history is not possible.   Objective:  Vitals:   08/01/17 1200 08/01/17 1215  BP: (!) 149/90   Pulse: (!) 117   Resp: (!) 21   Temp:  97.6 F (36.4 C)  SpO2: 94%      Intake/Output Summary (Last 24 hours) at 08/01/2017 1349 Last data filed at 08/01/2017 0700 Gross per 24 hour  Intake 1559.58 ml  Output 440 ml  Net 1119.58 ml    GENERAL: drowsy, no distress and comfortable SKIN: skin color, texture, turgor are normal, no rashes or significant lesions EYES: normal, Conjunctiva are pink and non-injected, sclera clear OROPHARYNX:no exudate, no erythema and lips, buccal mucosa, and tongue normal  LUNGS: Increased breathing effort.  Clear on auscultation anteriorly HEART: Tachycardia, no murmurs, mild lower extremity edema ABDOMEN:abdomen soft, n Musculoskeletal:no cyanosis of digits and no clubbing  NEURO: Unable to assess.  Drowsy   Labs:  Lab Results  Component Value Date   WBC 13.7 (H) 08/01/2017   HGB 11.1 (L) 08/01/2017   HCT 35.4 (L) 08/01/2017   MCV 93.4 08/01/2017  PLT 204 08/01/2017   NEUTROABS 7.0 07/27/2017    Lab Results  Component Value  Date   NA 142 08/01/2017   K 4.2 08/01/2017   CL 109 08/01/2017   CO2 24 08/01/2017    Studies:  Dg Abd 1 View  Result Date: 07/31/2017 CLINICAL DATA:  Abdominal pain. EXAM: ABDOMEN - 1 VIEW COMPARISON:  None. FINDINGS: The bowel gas pattern is normal. No evidence of bowel obstruction or pneumoperitoneum. Surgical clips within the abdomen and surgical sutures within the pelvis noted. No acute bony abnormalities identified. Vascular calcifications are present. IMPRESSION: No acute abnormality.  Unremarkable bowel gas pattern. Electronically Signed   By: Margarette Canada M.D.   On: 07/31/2017 15:15   Nm Pulmonary Perf And Vent  Result Date: 08/01/2017 CLINICAL DATA:  Lung cancer, elevated D-dimer, evaluate for PE EXAM: NUCLEAR MEDICINE VENTILATION - PERFUSION LUNG SCAN TECHNIQUE: Ventilation images were obtained in multiple projections using inhaled aerosol Tc-60m DTPA. Perfusion images were obtained in multiple projections after intravenous injection of Tc-41m-MAA. RADIOPHARMACEUTICALS:  28.3 mCi of Tc-75m DTPA aerosol inhalation and 3.9 mCi Tc44m-MAA IV COMPARISON:  Chest radiograph dated 07/31/2017. CT chest dated 07/15/2017. FINDINGS: Ventilation: Heterogeneous ventilation throughout the left lung and in the right upper lobe. No right lower lobe ventilation is present, corresponding to right lower lobe collapse on prior CT. Perfusion: Normal fusion in the right lung. Diminished/heterogeneous perfusion in the right upper and lower lobes. When correlating with recent chest radiograph, there is a diffuse alveolar process in the right upper lobe and left lung, favoring interstitial edema, less likely multifocal infection. Persistent right lower lobe collapse with a new small right pleural effusion. The radiographic appearance likely accounts for the heterogeneous perfusion. Technically speaking, although pulmonary embolism is not suspected, by definition this reflects an intermediate probability study.  IMPRESSION: Markedly limited evaluation in the setting of known right lower lobe collapse and abnormal chest radiograph which favors interstitial edema or less likely multifocal infection. While pulmonary embolism is not suspected, by definition the study is intermediate probability for pulmonary embolism. In patients with abnormal chest radiograph, CT with contrast is generally preferred for this reason if/when able. Electronically Signed   By: Julian Hy M.D.   On: 08/01/2017 00:08   Dg Chest Port 1 View  Result Date: 08/01/2017 CLINICAL DATA:  Sob; lung ca EXAM: PORTABLE CHEST 1 VIEW COMPARISON:  07/31/2017 FINDINGS: Heart size is normal. Diffuse reticular opacities are identified throughout the lungs bilaterally. More focal opacity is identified in the MEDIAL RIGHT lung base, stable in appearance. There is a RIGHT pleural effusion. RIGHT paratracheal density is consistent with known adenopathy. IMPRESSION: Persistent significant lung opacities, more confluent at the RIGHT lung base. Mediastinal adenopathy. Electronically Signed   By: Nolon Nations M.D.   On: 08/01/2017 13:05   Dg Chest Port 1 View  Result Date: 07/31/2017 CLINICAL DATA:  Hypoxia, cough EXAM: PORTABLE CHEST 1 VIEW COMPARISON:  07/15/2017 FINDINGS: Diffuse interstitial and alveolar opacities throughout the lungs, new since prior study, likely edema. Moderate to large right pleural effusion with right lower lobe atelectasis or infiltrate. Heart is borderline in size. Hyperinflation. No acute bony abnormality. IMPRESSION: Borderline heart size. New diffuse interstitial and alveolar opacities, favor edema. Moderate to large right effusion with right lower lobe atelectasis or infiltrate. Electronically Signed   By: Rolm Baptise M.D.   On: 07/31/2017 19:56

## 2017-08-01 NOTE — Progress Notes (Signed)
ANTICOAGULATION CONSULT NOTE - Initial Consult  Pharmacy Consult for IV heparin Indication: pulmonary embolus  Allergies  Allergen Reactions  . Iohexol Anaphylaxis     Desc: ANAPHALAXIS-ARS ON 08-22-04 Went into a coma  . Sodium Hypochlorite Other (See Comments)    Solution used to place on packing burned very bad, patient states she can't use it any more due to this/thjcma  . Penicillins Hives and Swelling    Has patient had a PCN reaction causing immediate rash, facial/tongue/throat swelling, SOB or lightheadedness with hypotension: No Has patient had a PCN reaction causing severe rash involving mucus membranes or skin necrosis: Yes Has patient had a PCN reaction that required hospitalization: No Has patient had a PCN reaction occurring within the last 10 years: No If all of the above answers are "NO", then may proceed with Cephalosporin use.   Marland Kitchen Hydrocodone Nausea And Vomiting  . Tape Rash    Itchy rash    Patient Measurements: Height: 5\' 2"  (157.5 cm) Weight: 117 lb 15.1 oz (53.5 kg) IBW/kg (Calculated) : 50.1 Heparin Dosing Weight: 53.5  Vital Signs: Temp: 97.7 F (36.5 C) (07/29 0805) Temp Source: Oral (07/29 0805) BP: 108/59 (07/29 0600) Pulse Rate: 116 (07/29 0600)  Labs: Recent Labs    07/31/17 0349 07/31/17 1906 08/01/17 0145 08/01/17 0652  HGB 11.1* 11.3* 11.1*  --   HCT 35.8* 35.7* 35.4*  --   PLT 232 266 204  --   CREATININE 0.56 0.45 0.50  --   TROPONINI  --  0.04* 0.06* 0.06*    Estimated Creatinine Clearance: 56.9 mL/min (by C-G formula based on SCr of 0.5 mg/dL).   Medical History: Past Medical History:  Diagnosis Date  . Acid reflux   . Arthritis   . Cancer ([place])    kidney  . Cataract   . Chiari malformation type I ([place])   . Complication of anesthesia    Hard To Awaken  . COPD (chronic obstructive pulmonary disease) ([place])   . Diverticulitis   . Headache   . Pneumonia   . Stroke ([place])   . Subclavian artery stenosis, left (HCC)    s/p angioplasty/stent 04/25/08    Medications:  Scheduled:  . aspirin EC  325 mg Oral Daily  . budesonide (PULMICORT) nebulizer solution  0.25 mg Nebulization BID  . dexamethasone  4 mg Oral Daily  . diclofenac sodium  4 g Topical TID  . escitalopram  5 mg Oral Daily  . feeding supplement (ENSURE ENLIVE)  237 mL Oral BID BM  . feeding supplement (PRO-STAT SUGAR FREE 64)  30 mL Oral BID  . ipratropium-albuterol  3 mL Nebulization Q4H  . lactulose  300 mL Rectal Once  . lipase/protease/amylase  12,000 Units Oral TID AC  . mouth rinse  15 mL Mouth Rinse BID  . multivitamin  1 tablet Oral Daily  . nicotine  7 mg Transdermal Daily  . polyethylene glycol  17 g Oral Daily  . scopolamine  1 patch Transdermal Q72H  . senna-docusate  2 tablet Oral BID   Infusions:  . dextrose 5 % and 0.9 % NaCl with KCl 20 mEq/L 75 mL/hr at 07/31/17 1343  . famotidine (PEPCID) IV 20 mg (08/01/17 1022)    Assessment: 62 yo female admitted with CP with hx renal cell carcinoma now with suspected PE per CCM to start IV heparin per pharmacy dosing. Baseline CBC stable this admission so far except Plts 204 trending down some - monitor closely.   Goal  of Therapy:  Heparin level 0.3-0.7 units/ml Monitor platelets by anticoagulation protocol: Yes   Plan:  1) NO heparin bolus per Md orders 2) Start IV heparin at rate of 900 units/hr 3) Check heparin level 8 hours after start of IV heparin 4) Daily CBC and heparin level   Adrian Saran, PharmD, BCPS Pager [phone] 08/01/2017 11:29 AM

## 2017-08-01 NOTE — Progress Notes (Signed)
Spoke with family for about 25 minutes about plan of care and prognosis based on recent tissue histology I will return to bedside again at 40 pm for more extensive discussions about goals of care We discussed prognosis briefly with or without chemotherapy More notes to follow

## 2017-08-01 NOTE — Progress Notes (Signed)
Nutrition Follow-up  DOCUMENTATION CODES:   Not applicable  INTERVENTION:  - Will d/c Ensure Enlive.  - Will order Osmolite 1.2 @ 25 mL/hr to advance by 10 mL every 12 hours to reach goal rate of Osmolite 1.2 @ 55 mL/hr. At goal rate, this regimen will provide 1584 kcal, 73 grams of protein, 52 grams of fat, and 1082 mL free water.  - Will order 100 mL free water every 4 hours (600 mL/day)  Monitor magnesium, potassium, and phosphorus daily for at least 3 days, MD to replete as needed, as pt is at risk for refeeding syndrome given moderate malnutrition, poor PO intakes for >1 month.   NUTRITION DIAGNOSIS:   Moderate Malnutrition related to chronic illness(COPD/recurrent PNA) as evidenced by energy intake < 75% for > or equal to 1 month, percent weight loss. -ongoing  GOAL:   Patient will meet greater than or equal to 90% of their needs -unmet  MONITOR:   PO intake, Supplement acceptance, Weight trends, Labs  REASON FOR ASSESSMENT:   Consult Enteral/tube feeding initiation and management  ASSESSMENT:   Patient with PMH significant for renal cell carcinoma, tobacco abuse, COPD, and stroke. Presents this admission with complaints of chest pain. Admitted for right lower lobe lung collapse with possible lung mass and possible mass in the liver.    Weight +6 lbs/2.8 kg from 7/21-7/28. Patient continues with poor appetite and lack of desire to eat. She has mainly been refusing meals or only taking bites over the past few days. Patient was seen by SLP this AM who noted patient is a very high aspiration risk, grossly weak, and only intermittently followed commands. She had delayed swallow initiation. Recommendation made for dentures to remain out of patient's mouth d/t risk for bacterial accumulation. Recommendation made for patient to be NPO.   Consult for TF initiation and management received. Patient had 71 French NGT placed at 2:45 PM today; x-ray pending. Will order TF as outlined  above with slow advancement d/t refeeding risk. Re-estimated kcal need based on PMH and current medical course.  Palliative Care following and plans for follow-up tomorrow (7/30).   Medications reviewed; 20 mg IV Pepcid BID, 300 mL rectal lactulose x1 yesterday, 12000 units Creon TID, 1 tablet Prosight/day, 1 packet Miralax/day, 2 tablets Senokot BID. Labs reviewed; Ca: 8.2 mg/dL, Alk Phos elevated, LFTs elevated, ammonia: 62 umol/L.    Diet Order:   Diet Order           Diet Carb Modified Fluid consistency: Thin; Room service appropriate? Yes  Diet effective now          EDUCATION NEEDS:   Education needs have been addressed  Skin:  Skin Assessment: Skin Integrity Issues: Skin Integrity Issues:: Incisions Incisions: R abdomen (7/23)  Last BM:  7/27  Height:   Ht Readings from Last 1 Encounters:  07/31/17 _0  (1.575 m)    Weight:   Wt Readings from Last 1 Encounters:  07/31/17 117 lb 15.1 oz (53.5 kg)    Ideal Body Weight:  50 kg  BMI:  Body mass index is 21.57 kg/m.  Estimated Nutritional Needs:   Kcal:  1525-1775 (30-35 kcal/kg)  Protein:  70-80 grams   Fluid:  >1.4 L/day      Jarome Matin, MS, RD, LDN, [facility] Inpatient Clinical Dietitian Pager # [phone] After hours/weekend pager # [phone]

## 2017-08-01 NOTE — Progress Notes (Signed)
CRITICAL VALUE ALERT  Critical Value:  Troponin 0.06  Date & Time Notied:  Today @ roughly 0230  Provider Notified: [facility] on-call  Orders Received/Actions taken: pending

## 2017-08-01 NOTE — Progress Notes (Signed)
Patient ID: Wanda James, female   DOB: March 10, 1955, 62 y.o.   MRN: [id number]                                                                PROGRESS NOTE                                                                                                                                                                                                             Patient Demographics:    Wanda James, is a 62 y.o. female, DOB - 1955-07-23, VQQ:[id number]  Admit date - 07/19/2017   Admitting Physician Jani Gravel, MD  Outpatient Primary MD for the patient is Althisar, Peggyann Juba  LOS - 9  Outpatient Specialists:     Chief Complaint  Patient presents with  . Chest Pain       Brief Narrative    62 y.o.female,w h/o renal cell carcinoma, nicotine dep, [place] home o2, apparently presents with c/oSharp chest pain of the xyphoid process starting last nite. + sob, wheezing. + cough, mostly dry. Denies fever, chills, palp, n/v, diarrhea, brbpr, dysuria, hematuria.   In Ed,  CT scan chest/ abd IMPRESSION: 1. Partial right lower lobe collapse/atelectasis with displacement of the right major fissure and caudal displacement of the right hilum. Differential considerations for this finding may be secondary to mucous impaction. A right infrahilar mass or endobronchial lesion causing the atelectasis are not entirely excluded. Mediastinal lymphadenopathy is also noted concerning for possible metastatic disease though reactive lymph nodes might also account for this. 2. Subtle hypodensity masslike abnormalities of the unenhanced liver worrisome for possible metastatic disease in light of the pulmonary findings. CT or MRI with IV contrast would be preferable for further assessment. 3. Left nephrectomy. Compensatory hypertrophy of the right kidney with nonobstructing interpolar calculus possibly vascular. 4. Partial left colectomy. 5. Moderate atherosclerosis of the abdominal aorta and branch  vessels.   Trop <0.03 Wbc 6.8, Hgb 14.5, Plt 304 Na 138, K 3.7, Bun 11, Creatinine 0.81 Ast 193, Alt 129, Alk phos 518, T. Bili 0.3 Lipase 99  Lactic acid 2.27  Pt will be admitted for chest pain, Copd exacerbation, Hcap, and abnormal liver function        Subjective:    Wanda James overnite was hypoxic 63% and tachycardic.  Pt had CXR that showed moderate right pleural effusion. Afebrile, more alert this AM.   Pain control has been an issue as well as difficulty with nausea and swallowing.   No headache, No chest pain, No new weakness tingling or numbness, No Cough - SOB.    Assessment  & Plan :    Principal Problem:   Chest pain Active Problems:   COPD exacerbation (HCC)   HCAP (healthcare-associated pneumonia)   Cancer associated pain   Liver metastases (HCC)   Other constipation   Nausea & vomiting   Abdominal pain   Hypoxia, Acute respiratory failure (7/28) ? Pain medication, vs new pleural effusion (moderate) vs PE reconsult pulmonary for ? Thoracentesis, and assistance regarding whether or not to purse CTA regarding r/o PE.  Lovenox '1mg'$ /kg Randall x1 while awaiting pulmonary input Pulmonary consult appreciated  Troponin elevation (7/28) 12 lead ekg Check cardiac echo Cont aspirin '325mg'$  po qday No Statin due to already abnormal liver function    Nausea, Difficulty swallowing/ dysphagia Speech therapy consulted Will consider GI consult   AMSlikley secondary to pain medication, hyperammonemia Start oxycodone '10mg'$  po q6h prn Check CT brain => no acute process Check ABG=> co2 wnl Check ammonia levelin AM Lactulose 30gm po x1 given (7/26) Lactulose 15gm po x1 (7/27) Lactulose PR (7/28) Check ammonia level in AM   N/v improving Xray Abdomen 2 view=> unremarkable hemeoccult+ (of gastric fluid), doubt ugi bleeding Cont phenergan Cont zofran Start pepcid '20mg'$  iv bid(7/26)  Abdominal distension/ pain Xray Abdomen (7/28)=> nonspecifical  bowel gas pattern Family declined CT abd/ pelvis (7/28)  Difficulty swallowing Speech therapy consult  Right hilar mass. Liver metastasis. Suspected to have pneumonia with a work-up was suggestive of for large rightlungmass. Pulmonary consulted and felt that the patient should get liver biopsy to help with staging as well. IR consulted and the biopsy 07/26/2017. Results pending If the biopsy is negative patient will require bronchoscopy and biopsy as well. Currently no significant wheezing appreciated. 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 Suspected healthcare associated pneumonia. CT chest also suggested a possible postobstructive pneumonia versus atelectasis. Pulmonary consulted as well. Patient was on IV Solu-Medrol although I do not suspect that the patient does have any active COPD exacerbation and therefore will transition to oral prednisone.=>off MRSA PCR is negative therefore will discontinue vancomycin. Blood cultures so far negative as well. Continue cefepime for now and will likely transition her to oral Augmentin for the same. Monitor other recommendation from pulmonary. History of COPD continue home inhalers. Radiation oncology consulted , appreciate input  Chest pain, Chronic lower back pain Anxiety. Tramadol=> ineffective Percocet => mildly effective Continue Dilaudid MS contin '30mg'$  po bid Currently significant anxious and in distress, PRN Ativan ordered. If continues to have significant anxiety and difficult control pain and will consult palliative care for input. Currently do not have a diagnosis and therefore do not have a plan of care regarding her cancer and therefore will hold off on palliative care consultation for goals of  care. DC MS Contin DC Dilaudid Oxycodone '10mg'$  po q6h prn  History of renal cell carcinoma S/P left renal nephrectomy. Avoid nephrotoxic agent. Monitor.  Active smoker. More than 40 years of history. Nicotine patch.  Type 2 diabetes mellitus. Continue sliding scale insulin.  Severe protein calorie  malnutrition Start prostat 20m po bid (7/25)  Anxiety Start lexapo '5mg'$  po qday(7/25)  Diet: carb modified diet DVT Prophylaxis:subcutaneous Heparin  Advance goals of care discussion:full code  Family Communication:family was present at bedside, at the time of interview. The pt provided permission to discuss medical plan with the family. Opportunity was given to ask question and all questions were answered satisfactorily.   Disposition: Discharge to be determined.  Consultants:PCCM, IR, oncology Procedures:Liver biopsy       Anti-infectives (From admission, onward)   Start     Dose/Rate Route Frequency Ordered Stop   07/24/17 2200  vancomycin (VANCOCIN) IVPB 750 mg/150 ml premix  Status:  Discontinued     750 mg 150 mL/hr over 60 Minutes Intravenous Every 24 hours 07/17/2017 2334 07/25/17 1327   07/24/17 0600  ceFEPIme (MAXIPIME) 1 g in sodium chloride 0.9 % 100 mL IVPB     1 g 200 mL/hr over 30 Minutes Intravenous Every 8 hours 07/08/2017 2322 08/01/17 0030   07/22/2017 2145  vancomycin (VANCOCIN) IVPB 1000 mg/200 mL premix     1,000 mg 200 mL/hr over 60 Minutes Intravenous  Once 08/02/2017 2143 07/24/2017 2353   07/20/2017 2145  ceFEPIme (MAXIPIME) 2 g in sodium chloride 0.9 % 100 mL IVPB     2 g 200 mL/hr over 30 Minutes Intravenous  Once 07/21/2017 2143 07/07/2017 2230        Objective:   Vitals:   08/01/17 0200 08/01/17 0300 08/01/17 0400 08/01/17 0500  BP: (!) 153/96 (!) 142/81 116/76 105/66  Pulse: (!) 122 (!) 109 (!) 113 (!) 115  Resp: (!) 24 (!) 24 (!) 22 (!) 22  Temp:   97.9 F (36.6 C)   TempSrc:   Axillary   SpO2: 93% 92% 91% 92%  Weight:       Height:        Wt Readings from Last 3 Encounters:  07/31/17 53.5 kg (117 lb 15.1 oz)  06/26/17 53.4 kg (117 lb 12.8 oz)  08/19/16 53.5 kg (118 lb)     Intake/Output Summary (Last 24 hours) at 08/01/2017 0600 Last data filed at 08/01/2017 0500 Gross per 24 hour  Intake 1484.58 ml  Output 330 ml  Net 1154.58 ml     Physical Exam  Awake Alert, Oriented X 3, No new F.N deficits, Normal affect [place].AT,PERRAL Supple Neck,No JVD, No cervical lymphadenopathy appriciated.  Symmetrical Chest wall movement, Good air movement bilaterally, slight decrease in bs right lung base,  Tachy s1, s2, ,No Gallops,Rubs or new Murmurs, No Parasternal Heave +ve B.Sounds, Abd Soft, No tenderness, No organomegaly appriciated, No rebound - guarding or rigidity. No Cyanosis, Clubbing or edema, No new Rash or bruise     Data Review:    CBC Recent Labs  Lab 07/27/17 0504  07/29/17 1653 07/30/17 0434 07/31/17 0349 07/31/17 1906 08/01/17 0145  WBC 7.6   < > 11.8* 11.4* 14.2* 14.5* 13.7*  HGB 12.1   < > 11.8* 11.4* 11.1* 11.3* 11.1*  HCT 38.1   < > 38.1 36.1 35.8* 35.7* 35.4*  PLT 290   < > 275 253 232 266 204  MCV 90.7   < > 93.2 92.3 93.0 91.3 93.4  MCH 28.8   < > 28.9 29.2 28.8 28.9 29.3  MCHC 31.8   < > 31.0 31.6 31.0 31.7 31.4  RDW 16.0*   < > 15.8* 15.9* 16.2* 16.4* 16.2*  LYMPHSABS 0.6*  --   --   --   --   --   --  MONOABS 0.0*  --   --   --   --   --   --   EOSABS 0.0  --   --   --   --   --   --   BASOSABS 0.0  --   --   --   --   --   --    < > = values in this interval not displayed.    Chemistries  Recent Labs  Lab 07/27/17 0504  07/29/17 0433 07/30/17 0434 07/31/17 0349 07/31/17 1906 08/01/17 0145  NA 139   < > 135 139 142 143 142  K 4.7   < > 5.2* 4.7 4.8 4.3 4.2  CL 103   < > 100 102 106 108 109  CO2 29   < > '26 27 25 25 24  '$ GLUCOSE 101*   < > 77 95 88 107* 113*  BUN 23   < > 41* 43* 33* 25* 23  CREATININE 0.54   < > 0.71 0.68 0.56 0.45 0.50  CALCIUM 8.5*   < >  8.8* 8.6* 8.5* 8.3* 8.2*  MG 2.3  --   --   --   --   --   --   AST 118*   < > 127* 124* 115* 115* 110*  ALT 94*   < > 104* 99* 86* 79* 73*  ALKPHOS 470*   < > 528* 509* 483* 406* 406*  BILITOT 0.8   < > 1.3* 1.8* 2.2* 2.3* 2.3*   < > = values in this interval not displayed.   ------------------------------------------------------------------------------------------------------------------ No results for input(s): CHOL, HDL, LDLCALC, TRIG, CHOLHDL, LDLDIRECT in the last 72 hours.  Lab Results  Component Value Date   HGBA1C 6.8 (H) 06/28/2017   ------------------------------------------------------------------------------------------------------------------ No results for input(s): TSH, T4TOTAL, T3FREE, THYROIDAB in the last 72 hours.  Invalid input(s): FREET3 ------------------------------------------------------------------------------------------------------------------ No results for input(s): VITAMINB12, FOLATE, FERRITIN, TIBC, IRON, RETICCTPCT in the last 72 hours.  Coagulation profile Recent Labs  Lab 07/25/17 1124  INR 1.23    Recent Labs    07/31/17 1906  DDIMER 1.32*    Cardiac Enzymes Recent Labs  Lab 07/29/17 1328 07/31/17 1906 08/01/17 0145  TROPONINI <0.03 0.04* 0.06*   ------------------------------------------------------------------------------------------------------------------    Component Value Date/Time   BNP 25.0 06/26/2017 1506    Inpatient Medications  Scheduled Meds: . aspirin EC  325 mg Oral Daily  . dexamethasone  4 mg Oral Daily  . diclofenac sodium  4 g Topical TID  . enoxaparin (LOVENOX) injection  40 mg Subcutaneous Q24H  . escitalopram  5 mg Oral Daily  . feeding supplement (ENSURE ENLIVE)  237 mL Oral BID BM  . feeding supplement (PRO-STAT SUGAR FREE 64)  30 mL Oral BID  . ipratropium-albuterol  3 mL Nebulization TID  . lactulose  300 mL Rectal Once  . lipase/protease/amylase  12,000 Units Oral TID AC  . mouth rinse  15 mL  Mouth Rinse BID  . mometasone-formoterol  2 puff Inhalation BID  . multivitamin  1 tablet Oral Daily  . nicotine  7 mg Transdermal Daily  . polyethylene glycol  17 g Oral Daily  . scopolamine  1 patch Transdermal Q72H  . senna-docusate  2 tablet Oral BID  . tiotropium  18 mcg Inhalation Daily   Continuous Infusions: . dextrose 5 % and 0.9 % NaCl with KCl 20 mEq/L 75 mL/hr at 07/31/17 1343  . famotidine (PEPCID) IV Stopped (07/31/17 2145)   PRN  Meds:.levalbuterol, LORazepam, morphine injection, ondansetron (ZOFRAN) IV, promethazine, zolpidem  Micro Results Recent Results (from the past 240 hour(s))  Culture, blood (routine x 2) Call MD if unable to obtain prior to antibiotics being given     Status: None   Collection Time: 07/13/2017 11:59 PM  Result Value Ref Range Status   Specimen Description   Final    BLOOD LEFT ANTECUBITAL Performed at [facility] [address] [place], [place] [address]    Special Requests   Final    BOTTLES DRAWN AEROBIC AND ANAEROBIC Blood Culture results may not be optimal due to an excessive volume of blood received in culture bottles Performed at [facility] [address] [place], [place] [address]    Culture   Final    NO GROWTH 5 DAYS Performed at [facility], [place] [address], [place], [place] [address]    Report Status 07/29/2017 FINAL  Final  Culture, blood (routine x 2) Call MD if unable to obtain prior to antibiotics being given     Status: None   Collection Time: 07/25/17  4:45 AM  Result Value Ref Range Status   Specimen Description   Final    BLOOD RIGHT ANTECUBITAL Performed at [facility] [address] [place], [place] [address]    Special Requests   Final    BOTTLES DRAWN AEROBIC ONLY Blood Culture adequate volume Performed at [facility] [address], [place], [place] [address]    Culture   Final    NO GROWTH 5 DAYS Performed at [facility], [place] [address], [place], [place] [address]    Report Status 07/30/2017 FINAL  Final  MRSA PCR Screening     Status: None   Collection Time: 07/25/17  8:04 AM  Result Value Ref Range Status   MRSA by PCR NEGATIVE NEGATIVE Final    Comment:        The GeneXpert MRSA Assay (FDA approved for NASAL specimens only), is one component of a comprehensive MRSA colonization surveillance program. It is not intended to diagnose MRSA infection nor to guide or monitor treatment for MRSA infections. Performed at [facility], [place] [address] [place], [place] [address]   Gastrointestinal Panel by PCR , Stool     Status: Abnormal   Collection Time: 07/29/17  4:45 PM  Result Value Ref Range Status   Campylobacter species NOT DETECTED NOT DETECTED Final   Plesimonas shigelloides NOT DETECTED NOT DETECTED Final   Salmonella species NOT DETECTED NOT DETECTED Final   Yersinia enterocolitica NOT DETECTED NOT DETECTED Final   Vibrio species NOT DETECTED NOT DETECTED Final   Vibrio cholerae NOT DETECTED NOT DETECTED Final   Enteroaggregative E coli (EAEC) NOT DETECTED NOT DETECTED Final   Enteropathogenic E coli (EPEC) DETECTED (A) NOT DETECTED Final    Comment: RESULT CALLED TO, READ BACK BY AND VERIFIED WITH: DANA GUNDLACH 07/30/17 @ 1509  MLK    Enterotoxigenic E coli (ETEC) NOT DETECTED NOT DETECTED Final   Shiga like toxin producing E coli (STEC) NOT DETECTED NOT DETECTED Final   Shigella/Enteroinvasive E coli (EIEC) NOT DETECTED NOT DETECTED Final   Cryptosporidium NOT DETECTED NOT DETECTED Final   Cyclospora cayetanensis NOT DETECTED NOT DETECTED Final   Entamoeba histolytica NOT DETECTED NOT DETECTED Final   Giardia lamblia NOT DETECTED NOT DETECTED Final   Adenovirus F40/41 NOT DETECTED NOT DETECTED Final   Astrovirus NOT DETECTED NOT  DETECTED Final   Norovirus GI/GII NOT DETECTED NOT DETECTED Final   Rotavirus A NOT DETECTED NOT DETECTED Final   Sapovirus  (I, II, IV, and V) NOT DETECTED NOT DETECTED Final    Comment: Performed at [facility], [address] [place], [place] [address]    Radiology Reports Ct Abdomen Pelvis Wo Contrast  Result Date: 07/20/2017 CLINICAL DATA:  Mid chest and back pain. Patient was recently treated for pneumonia and has L4 and L5 fractures coughing. EXAM: CT CHEST, ABDOMEN AND PELVIS WITHOUT CONTRAST TECHNIQUE: Multidetector CT imaging of the chest, abdomen and pelvis was performed following the standard protocol without IV contrast. COMPARISON:  CXR from the same day, 06/29/2017 CXR and 01/31/2015 CT AP FINDINGS: CT CHEST FINDINGS Cardiovascular: Conventional branch pattern of the great vessels with atherosclerosis of the proximal great vessels. Mild atherosclerosis of the nonaneurysmal thoracic aorta measuring up to 3.3 cm along the ascending portion. Nondilated main pulmonary artery. Heart size is normal without pericardial effusion. Minimal coronary arteriosclerosis along the distal LAD. Mediastinum/Nodes: Mediastinal adenopathy is identified with largest lymph node right lower paratracheal measuring 2.2 cm in short axis. A 1.1 cm AP window lymph node is also noted. The trachea and mainstem bronchi are patent. Occlusion of the right lower lobe bronchus and branch vessels is noted. Lungs/Pleura: There is a confluent area of partially atelectatic/partially collapsed right lower lobe with displacement of the major fissure and inferior displacement of the right hilum from volume loss. This is likely related to obstruction of right lower lobe pulmonary bronchi possibly from mucus inspissation given somewhat tubular hypodensity new noted within the visualized bronchi. Alternatively an endobronchial lesion is not excluded or unlikely an extrinsic infrahilar mass causing extrinsic impression given the subtle tubular soft tissue opacities noted within the expected bronchi. Musculoskeletal: Superior endplate Schmorl's node of  T10. No aggressive osseous lesions. CT ABDOMEN PELVIS FINDINGS Hepatobiliary: Inhomogeneous masslike hypodensities are noted of the liver concerning for liver lesions possibly metastatic given findings in the chest. No biliary dilatation is identified. The gallbladder is unremarkable. Pancreas: Normal Spleen: Normal size spleen without focal mass. Adrenals/Urinary Tract: Status post left nephrectomy. Right adrenal gland and kidney are nonacute with compensatory hypertrophy of the right kidney. Punctate renal pelvic calcifications seen on the right possibly vascular or a nonobstructing calculus. Left adrenal gland is not well visualized and may be surgically absent as well. Nodularity in the expected location of the adrenal gland is felt to represent partial volume averaging of the tail of the pancreas. Stomach/Bowel: The stomach is decompressed in appearance. Normal small bowel rotation without obstruction or inflammation. The distal and terminal ileum are normal. Normal appearing appendix is noted. Increased stool burden within the colon compatible constipation. No large bowel inflammation. Partial colectomy on the left with sutures noted in the region of the rectosigmoid. Vascular/Lymphatic: Moderate to marked atherosclerosis of the abdominal aorta and branch vessels. Small retroperitoneal lymph nodes are identified without pathologic enlargement. Reproductive: Hysterectomy.  No adnexal mass. Other: No free air nor free fluid. Musculoskeletal: Degenerative disc disease L1 through L4. No aggressive osseous lesions identified. IMPRESSION: 1. Partial right lower lobe collapse/atelectasis with displacement of the right major fissure and caudal displacement of the right hilum. Differential considerations for this finding may be secondary to mucous impaction. A right infrahilar mass or endobronchial lesion causing the atelectasis are not entirely excluded. Mediastinal lymphadenopathy is also noted concerning for  possible metastatic disease though reactive lymph nodes might also account for this. 2. Subtle hypodensity  masslike abnormalities of the unenhanced liver worrisome for possible metastatic disease in light of the pulmonary findings. CT or MRI with IV contrast would be preferable for further assessment. 3. Left nephrectomy. Compensatory hypertrophy of the right kidney with nonobstructing interpolar calculus possibly vascular. 4. Partial left colectomy. 5. Moderate atherosclerosis of the abdominal aorta and branch vessels. Electronically Signed   By: Ashley Royalty M.D.   On: 07/22/2017 21:33   Dg Chest 2 View  Result Date: 08/02/2017 CLINICAL DATA:  Chest pain EXAM: CHEST - 2 VIEW COMPARISON:  June 29, 2017 FINDINGS: There is extensive right lower lobe airspace consolidation consistent with pneumonia. Lungs elsewhere clear. Heart size and pulmonary vascularity are normal. No adenopathy. There is an old healed fracture of the lateral right sixth rib. There is aortic atherosclerosis. IMPRESSION: Extensive consolidation right lower lobe consistent with pneumonia. Lungs elsewhere clear. Heart size normal. There is aortic atherosclerosis. Aortic Atherosclerosis (ICD10-I70.0). Electronically Signed   By: Lowella Grip III M.D.   On: 07/04/2017 20:57   Dg Abd 1 View  Result Date: 07/31/2017 CLINICAL DATA:  Abdominal pain. EXAM: ABDOMEN - 1 VIEW COMPARISON:  None. FINDINGS: The bowel gas pattern is normal. No evidence of bowel obstruction or pneumoperitoneum. Surgical clips within the abdomen and surgical sutures within the pelvis noted. No acute bony abnormalities identified. Vascular calcifications are present. IMPRESSION: No acute abnormality.  Unremarkable bowel gas pattern. Electronically Signed   By: Margarette Canada M.D.   On: 07/31/2017 15:15   Ct Head Wo Contrast  Result Date: 07/29/2017 CLINICAL DATA:  Headache and altered level of consciousness. History of lung and renal cell carcinoma. EXAM: CT HEAD  WITHOUT CONTRAST TECHNIQUE: Contiguous axial images were obtained from the base of the skull through the vertex without intravenous contrast. COMPARISON:  Brain MRI 07/26/2017.  Head CT scan 05/29/2008. FINDINGS: Brain: Encephalomalacia in the parietal and occipital lobes is identified as seen on the comparison MRI. No evidence of acute intracranial abnormality including hemorrhage, infarct, mass lesion, midline shift or abnormal extra-axial fluid collection. No hydrocephalus or pneumocephalus. Vascular: Atherosclerosis is noted. Skull: Intact.  No focal lesion is identified. Sinuses/Orbits: Status post lens extraction bilaterally. No acute finding. Other: None. IMPRESSION: No acute abnormality. Areas of encephalomalacia in the parietal and occipital lobes bilaterally as seen on prior MRI and may be due to remote trauma or reversible encephalopathy. Atherosclerosis. Electronically Signed   By: Inge Rise M.D.   On: 07/29/2017 12:24   Ct Chest Wo Contrast  Result Date: 07/04/2017 CLINICAL DATA:  Mid chest and back pain. Patient was recently treated for pneumonia and has L4 and L5 fractures coughing. EXAM: CT CHEST, ABDOMEN AND PELVIS WITHOUT CONTRAST TECHNIQUE: Multidetector CT imaging of the chest, abdomen and pelvis was performed following the standard protocol without IV contrast. COMPARISON:  CXR from the same day, 06/29/2017 CXR and 01/31/2015 CT AP FINDINGS: CT CHEST FINDINGS Cardiovascular: Conventional branch pattern of the great vessels with atherosclerosis of the proximal great vessels. Mild atherosclerosis of the nonaneurysmal thoracic aorta measuring up to 3.3 cm along the ascending portion. Nondilated main pulmonary artery. Heart size is normal without pericardial effusion. Minimal coronary arteriosclerosis along the distal LAD. Mediastinum/Nodes: Mediastinal adenopathy is identified with largest lymph node right lower paratracheal measuring 2.2 cm in short axis. A 1.1 cm AP window lymph node  is also noted. The trachea and mainstem bronchi are patent. Occlusion of the right lower lobe bronchus and branch vessels is noted. Lungs/Pleura: There is a confluent area of  partially atelectatic/partially collapsed right lower lobe with displacement of the major fissure and inferior displacement of the right hilum from volume loss. This is likely related to obstruction of right lower lobe pulmonary bronchi possibly from mucus inspissation given somewhat tubular hypodensity new noted within the visualized bronchi. Alternatively an endobronchial lesion is not excluded or unlikely an extrinsic infrahilar mass causing extrinsic impression given the subtle tubular soft tissue opacities noted within the expected bronchi. Musculoskeletal: Superior endplate Schmorl's node of T10. No aggressive osseous lesions. CT ABDOMEN PELVIS FINDINGS Hepatobiliary: Inhomogeneous masslike hypodensities are noted of the liver concerning for liver lesions possibly metastatic given findings in the chest. No biliary dilatation is identified. The gallbladder is unremarkable. Pancreas: Normal Spleen: Normal size spleen without focal mass. Adrenals/Urinary Tract: Status post left nephrectomy. Right adrenal gland and kidney are nonacute with compensatory hypertrophy of the right kidney. Punctate renal pelvic calcifications seen on the right possibly vascular or a nonobstructing calculus. Left adrenal gland is not well visualized and may be surgically absent as well. Nodularity in the expected location of the adrenal gland is felt to represent partial volume averaging of the tail of the pancreas. Stomach/Bowel: The stomach is decompressed in appearance. Normal small bowel rotation without obstruction or inflammation. The distal and terminal ileum are normal. Normal appearing appendix is noted. Increased stool burden within the colon compatible constipation. No large bowel inflammation. Partial colectomy on the left with sutures noted in the  region of the rectosigmoid. Vascular/Lymphatic: Moderate to marked atherosclerosis of the abdominal aorta and branch vessels. Small retroperitoneal lymph nodes are identified without pathologic enlargement. Reproductive: Hysterectomy.  No adnexal mass. Other: No free air nor free fluid. Musculoskeletal: Degenerative disc disease L1 through L4. No aggressive osseous lesions identified. IMPRESSION: 1. Partial right lower lobe collapse/atelectasis with displacement of the right major fissure and caudal displacement of the right hilum. Differential considerations for this finding may be secondary to mucous impaction. A right infrahilar mass or endobronchial lesion causing the atelectasis are not entirely excluded. Mediastinal lymphadenopathy is also noted concerning for possible metastatic disease though reactive lymph nodes might also account for this. 2. Subtle hypodensity masslike abnormalities of the unenhanced liver worrisome for possible metastatic disease in light of the pulmonary findings. CT or MRI with IV contrast would be preferable for further assessment. 3. Left nephrectomy. Compensatory hypertrophy of the right kidney with nonobstructing interpolar calculus possibly vascular. 4. Partial left colectomy. 5. Moderate atherosclerosis of the abdominal aorta and branch vessels. Electronically Signed   By: Ashley Royalty M.D.   On: 07/29/2017 21:33   Mr Jeri Cos VQ Contrast  Result Date: 07/26/2017 CLINICAL DATA:  Non-small cell lung cancer, here for staging. History of type 1 Chiari malformation, stroke. EXAM: MRI HEAD WITHOUT AND WITH CONTRAST TECHNIQUE: Multiplanar, multiecho pulse sequences of the brain and surrounding structures were obtained without and with intravenous contrast. CONTRAST:  48m MULTIHANCE GADOBENATE DIMEGLUMINE 529 MG/ML IV SOLN COMPARISON:  MRI head May 29, 2008 FINDINGS: Sequences vary from moderate to severely motion degraded. INTRACRANIAL CONTENTS: No reduced diffusion to suggest acute  ischemia or hypercellular tumor. No susceptibility artifact to suggest hemorrhage. Biparietal bilateral occipital lobe encephalomalacia. No overall parenchymal brain volume loss for age. A few scattered subcentimeter supratentorial white matter FLAIR T2 hyperintensities compatible with chronic small vessel ischemic changes. 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 No midline shift, mass effect or masses. No abnormal extra-axial fluid collections. Low-lying cerebellar tonsils difficult to quantify due to motion. VASCULAR: Normal major intracranial vascular flow  voids present at skull base. SKULL AND UPPER CERVICAL SPINE: No abnormal sellar expansion. Heterogeneous bone marrow signal. Craniocervical junction maintained. SINUSES/ORBITS: The mastoid air-cells and included paranasal sinuses are well-aerated.The included ocular globes and orbital contents are non-suspicious. Status post bilateral ocular lens implants. OTHER: None. IMPRESSION: 1. Moderate to severely motion degraded examination. 2. No identified intracranial metastasis, however motion decreases sensitivity for small metastasis or leptomeningeal disease. If continued concern for metastatic disease, sedation may be warranted. 3. Nonspecific heterogeneous bone marrow signal, infiltrative tumor not excluded 4. 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 Electronically Signed   By: Elon Alas M.D.   On: 07/26/2017 19:35   Mr Abdomen W Wo Contrast  Result Date: 07/24/2017 CLINICAL DATA:  LEFT lower lobe collapse difficulty breathing. Indeterminate hepatic lesions identified on noncontrast CT. History of renal cell carcinoma. LEFT nephrectomy EXAM: MRI ABDOMEN WITHOUT AND WITH CONTRAST TECHNIQUE: Multiplanar multisequence MR imaging of the abdomen was performed both before and after the administration of intravenous contrast. CONTRAST:  19m MULTIHANCE GADOBENATE DIMEGLUMINE 529 MG/ML IV SOLN  COMPARISON:  CT 07/15/2017 FINDINGS: Lower chest: A complete collapse of the RIGHT lower lobe with obstruction of the RIGHT lower lobe bronchus. On coronal imaging there is masslike lesion surrounding the bronchus intermedius measuring 6.4 by 4.1 cm (image 16/6 per Hepatobiliary: Too numerous to count round enhancing lesions extensively involving the LEFT RIGHT hepatic lobe. Lesions range in size from 10 mm to 33 mm. Approximately 100 lesions. Largest lesion occupies the LEFT hepatic lobe measures 6.4 cm large example lesion in the RIGHT hepatic lobe measures 3.6 cm in the dome (image 27/9 0 point. There is no biliary duct dilatation. The gallbladder and common bile duct are normal. Pancreas: Pancreas is normal. No ductal dilatation. No pancreatic inflammation. Spleen: Normal spleen Adrenals/urinary tract: Adrenal glands are normal. Post LEFT nephrectomy. Compensatory hypertrophy of the RIGHT kidney. No RIGHT renal lesion. Stomach/Bowel: Stomach and limited view of the bowel is unremarkable. Vascular/Lymphatic: Normal abdominal aorta. No significant adenopathy Reproductive: Other: No free fluid. Musculoskeletal: No aggressive osseous lesion. IMPRESSION: 1. Large RIGHT hilar mass surrounds the bronchus intermedius and extends into the mediastinum consistent with primary malignancy versus metastatic lesion. This lesion obstructs the RIGHT lower bronchus with complete atelectasis of the RIGHT lower lobe. 2. 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 Lesions reached near confluence and and number approximately 100. 3. LEFT nephrectomy.  Normal hypertrophy RIGHT kidney. 4. No bony metastasis identified Electronically Signed   By: SSuzy BouchardM.D.   On: 07/24/2017 15:28   Nm Pulmonary Perf And Vent  Result Date: 08/01/2017 CLINICAL DATA:  Lung cancer, elevated D-dimer, evaluate for PE EXAM: NUCLEAR MEDICINE VENTILATION - PERFUSION LUNG SCAN TECHNIQUE: Ventilation images were obtained in multiple  projections using inhaled aerosol Tc-932mTPA. Perfusion images were obtained in multiple projections after intravenous injection of Tc-9934mA. RADIOPHARMACEUTICALS:  28.3 mCi of Tc-68m81mA aerosol inhalation and 3.9 mCi Tc68m-65mIV COMPARISON:  Chest radiograph dated 07/31/2017. CT chest dated 07/12/2017. FINDINGS: Ventilation: Heterogeneous ventilation throughout the left lung and in the right upper lobe. No right lower lobe ventilation is present, corresponding to right lower lobe collapse on prior CT. Perfusion: Normal fusion in the right lung. Diminished/heterogeneous perfusion in the right upper and lower lobes. When correlating with recent chest radiograph, there is a diffuse alveolar process in the right upper lobe and left lung, favoring interstitial edema, less likely multifocal infection. Persistent right lower lobe collapse with a new small  right pleural effusion. The radiographic appearance likely accounts for the heterogeneous perfusion. Technically speaking, although pulmonary embolism is not suspected, by definition this reflects an intermediate probability study. IMPRESSION: Markedly limited evaluation in the setting of known right lower lobe collapse and abnormal chest radiograph which favors interstitial edema or less likely multifocal infection. While pulmonary embolism is not suspected, by definition the study is intermediate probability for pulmonary embolism. In patients with abnormal chest radiograph, CT with contrast is generally preferred for this reason if/when able. Electronically Signed   By: Julian Hy M.D.   On: 08/01/2017 00:08   US Biopsy (liver)  Result Date: 07/26/2017 INDICATION: Large right hilar mass, innumerable liver metastases EXAM: ULTRASOUND GUIDED CORE BIOPSY OF RIGHT LIVER LESION MEDICATIONS: 1% lidocaine local ANESTHESIA/SEDATION: Versed 1.'5mg'$  IV; Fentanyl 5mg IV; Moderate Sedation Time:  10 minutes The patient was continuously monitored during the  procedure by the interventional radiology nurse under my direct supervision. FLUOROSCOPY TIME:  Fluoroscopy Time: None. COMPLICATIONS: None immediate. PROCEDURE: The procedure, risks, benefits, and alternatives were explained to the patient. Questions regarding the procedure were encouraged and answered. The patient understands and consents to the procedure. Previous imaging reviewed. Preliminary ultrasound performed. A right hepatic lesion was demonstrated along the right subcostal margin adjacent to the gallbladder. Overlying skin marked. Under sterile conditions and local anesthesia, a 17 gauge 6.8 cm access needle was advanced to the lesion. Needle position confirmed with ultrasound. 18 gauge core biopsies obtained. Images obtained for documentation. No immediate complication. Patient tolerated the biopsy well. FINDINGS: Imaging confirms needle placed in the right hepatic lesion for core biopsy. IMPRESSION: Successful ultrasound right hepatic mass 18 gauge core biopsy Electronically Signed   By: MJerilynn Mages  Shick M.D.   On: 07/26/2017 16:34   Dg Chest Port 1 View  Result Date: 07/31/2017 CLINICAL DATA:  Hypoxia, cough EXAM: PORTABLE CHEST 1 VIEW COMPARISON:  07/31/2017 FINDINGS: Diffuse interstitial and alveolar opacities throughout the lungs, new since prior study, likely edema. Moderate to large right pleural effusion with right lower lobe atelectasis or infiltrate. Heart is borderline in size. Hyperinflation. No acute bony abnormality. IMPRESSION: Borderline heart size. New diffuse interstitial and alveolar opacities, favor edema. Moderate to large right effusion with right lower lobe atelectasis or infiltrate. Electronically Signed   By: KRolm BaptiseM.D.   On: 07/31/2017 19:56   Dg Abd 2 Views  Result Date: 07/29/2017 CLINICAL DATA:  Hemoptysis, abdominal pain. EXAM: ABDOMEN - 2 VIEW COMPARISON:  None. FINDINGS: The bowel gas pattern is normal. There is no evidence of free air. No radio-opaque calculi or  other significant radiographic abnormality is seen. IMPRESSION: No evidence of bowel obstruction or ileus. No definite pneumoperitoneum is noted. Electronically Signed   By: JMarijo Conception M.D.   On: 07/29/2017 16:40    Time Spent in minutes  30   JJani GravelM.D on 08/01/2017 at 6:00 AM  Between 7am to 7pm - Pager - [phone]   After 7pm go to [email or web address] - password [facility] -  Office  [phone]

## 2017-08-01 NOTE — Progress Notes (Signed)
Patient family asked that RT return at later time to administer scheduled neb tx so patient could rest.

## 2017-08-01 NOTE — Evaluation (Signed)
Clinical/Bedside Swallow Evaluation Patient Details  Name: Wanda James MRN: [id number] Date of Birth: [birth year]  Today's Date: 08/01/2017 Time: SLP Start Time (ACUTE ONLY): 0940 SLP Stop Time (ACUTE ONLY): 1044 SLP Time Calculation (min) (ACUTE ONLY): 64 min  Past Medical History:  Past Medical History:  Diagnosis Date  . Acid reflux   . Arthritis   . Cancer ([place])    kidney  . Cataract   . Chiari malformation type I ([place])   . Complication of anesthesia    Hard To Awaken  . COPD (chronic obstructive pulmonary disease) ([place])   . Diverticulitis   . Headache   . Pneumonia   . Stroke ([place])   . Subclavian artery stenosis, left ([facility])    s/p angioplasty/stent 04/25/08   Past Surgical History:  Past Surgical History:  Procedure Laterality Date  . ABDOMINAL HYSTERECTOMY     partial  . BREAST SURGERY     left lumpectomy  . carotidendarterectomy    . COLON SURGERY    . EYE SURGERY    . INNER EAR SURGERY     right  . LUMBAR LAMINECTOMY/DECOMPRESSION MICRODISCECTOMY N/A 08/19/2016   Procedure: LUMBAR 4-5 DECOMPRESSION;  Surgeon: Phylliss Bob, MD;  Location: [facility];  Service: Orthopedics;  Laterality: N/A;  LUMBAR 4-5 DECOMPRESSION REQUESTED TIME 2.5 HRS   . NEPHRECTOMY     left  . PARTIAL COLECTOMY    . RHINOPLASTY    . TONSILLECTOMY    . TUBAL LIGATION     HPI:  62 yo female adm to [facility] chest pain, has COPD and h/o smoking.  On home oxygen 2 liters with CAP x3 weeks ago with chest pain and dyspnea with nausea.  Pt recently in hospital 2 weeks ago - and found to have SCLC/renal carcinoma - recently been receiving radiation.  CXR showed right LL collapse/ATX - ? mucus impaction vs lesion vs mediastinal lymphadenopathy.   Pt has been on a diet and swallow eval ordered.  Pt has been on 15 HFNC, WBC increased to 13.7 from 7.6 today.  Family present and reports pt only having problems swallowing for a few days.     Assessment / Plan / Recommendation Clinical Impression  Pt  currently is very high aspiration risk with po.  She in on 15L HFNC and her oxygen saturation decreases and WOB increases with minimal effort.   Pt without focal CN deficts to indicate possible neuro change.  She is however grossly weak and only intermittently follows directions.   Oral care provided by this SLP.  Pt provided with intake of ice chips, tsps water, nectar thick juice and applesauce.  Delayed swallow initiation noted with intermittent multiple swallows but not immediately indication of airway compromise.   She does have frequent belching with po - ? impact of possible mediastinal lymphadenopathy on esophageal clearance.    Upon swallowing medication crushed with puree- pt demonstrated belching, gagging and coughing - concerning for airway infiltration.  SLP encouraged pt to cough to clear and allowed time for vitals to stabilize.     Afterward SLP provided oral care using toothette and oral suction, educated family extensively to findings/clinical reasonings for pt to remain npo x single ice chips after oral care with assurance of tolerance.    Recommended pt's dentures remain out due to increased risk of bacterial accumulation.  Note pt for palliative meeting.  Will follow up- advised goals of decreasing RR, increasing oxygen saturation and decreased requirement for high flow oxygen reviewed.  Will follow up. MD paged with recommendations.       SLP Visit Diagnosis: Dysphagia, oropharyngeal phase (R13.12)    Aspiration Risk  Severe aspiration risk;Risk for inadequate nutrition/hydration    Diet Recommendation NPO;Ice chips PRN after oral care   Medication Administration: Via alternative means Compensations: Minimize environmental distractions    Other  Recommendations Oral Care Recommendations: Oral care QID   Follow up Recommendations   tbd     Frequency and Duration min 1 x/week  1 week       Prognosis Prognosis for Safe Diet Advancement: Guarded Barriers to Reach Goals:  Severity of deficits;Other (Comment)(medical diagnosis)      Swallow Study   General Date of Onset: 08/01/17 HPI: 62 yo female adm to [facility] chest pain, has COPD and h/o smoking.  On home oxygen 2 liters with CAP x3 weeks ago with chest pain and dyspnea with nausea.  Pt recently in hospital 2 weeks ago - and found to have SCLC/renal carcinoma - recently been receiving radiation.  CXR showed right LL collapse/ATX - ? mucus impaction vs lesion vs mediastinal lymphadenopathy.   Pt has been on a diet and swallow eval ordered.  Pt has been on 15 HFNC, WBC increased to 13.7 from 7.6 today.  Family present and reports pt only having problems swallowing for a few days.   Type of Study: Bedside Swallow Evaluation Diet Prior to this Study: Regular;Thin liquids(nurse has not been feeding pt) Respiratory Status: Other (comment)(HFNC) History of Recent Intubation: No Behavior/Cognition: Lethargic/Drowsy;Other (Comment);Confused(delayed ability to follow directions) Oral Care Completed by SLP: Yes Oral Cavity - Dentition: Dentures, bottom;Dentures, top(removed and asked family to keep out) Self-Feeding Abilities: Total assist Patient Positioning: Upright in bed Baseline Vocal Quality: Low vocal intensity Volitional Cough: Strong(productive at times, has lumbar fx therefore may induce pain and decr strength) Volitional Swallow: Able to elicit    Oral/Motor/Sensory Function Overall Oral Motor/Sensory Function: Generalized oral weakness   Ice Chips Ice chips: Impaired Pharyngeal Phase Impairments: Multiple swallows   Thin Liquid Thin Liquid: Impaired Pharyngeal  Phase Impairments: Multiple swallows    Nectar Thick Nectar Thick Liquid: Impaired Presentation: Spoon Oral phase functional implications: Prolonged oral transit Pharyngeal Phase Impairments: Cough - Immediate;Cough - Delayed;Change in Vital Signs Other Comments: delayed cough with medication administration- crushed with nectar and puree   Honey  Thick Honey Thick Liquid: Not tested   Puree Puree: Impaired Oral Phase Functional Implications: Prolonged oral transit Pharyngeal Phase Impairments: Multiple swallows;Cough - Immediate;Cough - Delayed Other Comments: gagging, coughing immediately and delayed post-swallow   Solid     Solid: Not tested      Macario Golds 08/01/2017,11:07 AM  Luanna Salk, [place] [facility] SLP [phone]

## 2017-08-02 ENCOUNTER — Telehealth: Payer: Self-pay | Admitting: *Deleted

## 2017-08-02 ENCOUNTER — Ambulatory Visit: Payer: BLUE CROSS/BLUE SHIELD

## 2017-08-02 ENCOUNTER — Inpatient Hospital Stay (HOSPITAL_COMMUNITY): Payer: BLUE CROSS/BLUE SHIELD

## 2017-08-02 ENCOUNTER — Inpatient Hospital Stay: Payer: Self-pay

## 2017-08-02 ENCOUNTER — Ambulatory Visit
Admit: 2017-08-02 | Discharge: 2017-08-02 | Disposition: A | Discharge status: Home / Self Care | Payer: BLUE CROSS/BLUE SHIELD | Attending: Radiation Oncology | Admitting: Radiation Oncology

## 2017-08-02 DIAGNOSIS — Z515 Encounter for palliative care: Secondary | ICD-10-CM

## 2017-08-02 DIAGNOSIS — Z7189 Other specified counseling: Secondary | ICD-10-CM

## 2017-08-02 DIAGNOSIS — E440 Moderate protein-calorie malnutrition: Secondary | ICD-10-CM

## 2017-08-02 LAB — BLOOD GAS, ARTERIAL
ACID-BASE EXCESS: 3.3 mmol/L — AB (ref 0.0–2.0)
Acid-Base Excess: 3 mmol/L — ABNORMAL HIGH (ref 0.0–2.0)
BICARBONATE: 26.6 mmol/L (ref 20.0–28.0)
BICARBONATE: 28.5 mmol/L — AB (ref 20.0–28.0)
DRAWN BY: 235321
Drawn by: 441261
FIO2: 100
LHR: 16 {breaths}/min
MECHVT: 400 mL
O2 CONTENT: 12 L/min
O2 SAT: 95.8 %
O2 Saturation: 82.2 %
PCO2 ART: 38.7 mmHg (ref 32.0–48.0)
PEEP: 5 cmH2O
PH ART: 7.388 (ref 7.350–7.450)
PO2 ART: 45.4 mmHg — AB (ref 83.0–108.0)
Patient temperature: 98.6
Patient temperature: 98.6
pCO2 arterial: 48.4 mmHg — ABNORMAL HIGH (ref 32.0–48.0)
pH, Arterial: 7.452 — ABNORMAL HIGH (ref 7.350–7.450)
pO2, Arterial: 113 mmHg — ABNORMAL HIGH (ref 83.0–108.0)

## 2017-08-02 LAB — BASIC METABOLIC PANEL
ANION GAP: 10 (ref 5–15)
BUN: 25 mg/dL — AB (ref 8–23)
CHLORIDE: 107 mmol/L (ref 98–111)
CO2: 29 mmol/L (ref 22–32)
Calcium: 8.2 mg/dL — ABNORMAL LOW (ref 8.9–10.3)
Creatinine, Ser: 0.38 mg/dL — ABNORMAL LOW (ref 0.44–1.00)
GFR calc Af Amer: >60 mL/min (ref >60)
GFR calc non Af Amer: >60 mL/min (ref >60)
GLUCOSE: 128 mg/dL — AB (ref 70–99)
POTASSIUM: 4.4 mmol/L (ref 3.5–5.1)
Sodium: 146 mmol/L — ABNORMAL HIGH (ref 135–145)

## 2017-08-02 LAB — COMPREHENSIVE METABOLIC PANEL
ALT: 84 U/L — ABNORMAL HIGH (ref 0–44)
ANION GAP: 5 (ref 5–15)
AST: 149 U/L — ABNORMAL HIGH (ref 15–41)
Albumin: 2 g/dL — ABNORMAL LOW (ref 3.5–5.0)
Alkaline Phosphatase: 468 U/L — ABNORMAL HIGH (ref 38–126)
BILIRUBIN TOTAL: 2.2 mg/dL — AB (ref 0.3–1.2)
BUN: 20 mg/dL (ref 8–23)
CO2: 28 mmol/L (ref 22–32)
Calcium: 8.4 mg/dL — ABNORMAL LOW (ref 8.9–10.3)
Chloride: 110 mmol/L (ref 98–111)
Creatinine, Ser: 0.39 mg/dL — ABNORMAL LOW (ref 0.44–1.00)
Glucose, Bld: 118 mg/dL — ABNORMAL HIGH (ref 70–99)
POTASSIUM: 4.2 mmol/L (ref 3.5–5.1)
Sodium: 143 mmol/L (ref 135–145)
TOTAL PROTEIN: 5.3 g/dL — AB (ref 6.5–8.1)

## 2017-08-02 LAB — HEPARIN LEVEL (UNFRACTIONATED)
HEPARIN UNFRACTIONATED: 0.34 [IU]/mL (ref 0.30–0.70)
Heparin Unfractionated: 0.4 IU/mL (ref 0.30–0.70)

## 2017-08-02 LAB — LACTIC ACID, PLASMA
Lactic Acid, Venous: 3.1 mmol/L (ref 0.5–1.9)
Lactic Acid, Venous: 2.4 mmol/L (ref 0.5–1.9)

## 2017-08-02 LAB — HEPATIC FUNCTION PANEL
ALT: 110 U/L — ABNORMAL HIGH (ref 0–44)
AST: 204 U/L — ABNORMAL HIGH (ref 15–41)
Albumin: 2 g/dL — ABNORMAL LOW (ref 3.5–5.0)
Alkaline Phosphatase: 490 U/L — ABNORMAL HIGH (ref 38–126)
BILIRUBIN INDIRECT: 1.3 mg/dL — AB (ref 0.3–0.9)
Bilirubin, Direct: 2.1 mg/dL — ABNORMAL HIGH (ref 0.0–0.2)
Total Bilirubin: 3.4 mg/dL — ABNORMAL HIGH (ref 0.3–1.2)
Total Protein: 5.2 g/dL — ABNORMAL LOW (ref 6.5–8.1)

## 2017-08-02 LAB — ABO/RH: ABO/RH(D): O POS

## 2017-08-02 LAB — GLUCOSE, CAPILLARY
GLUCOSE-CAPILLARY: 110 mg/dL — AB (ref 70–99)
GLUCOSE-CAPILLARY: 131 mg/dL — AB (ref 70–99)
GLUCOSE-CAPILLARY: 98 mg/dL (ref 70–99)
Glucose-Capillary: 104 mg/dL — ABNORMAL HIGH (ref 70–99)

## 2017-08-02 LAB — AMMONIA: AMMONIA: 87 umol/L — AB (ref 9–35)

## 2017-08-02 LAB — CBC
HEMATOCRIT: 34.9 % — AB (ref 36.0–46.0)
Hemoglobin: 11.1 g/dL — ABNORMAL LOW (ref 12.0–15.0)
MCH: 29.1 pg (ref 26.0–34.0)
MCHC: 31.8 g/dL (ref 30.0–36.0)
MCV: 91.6 fL (ref 78.0–100.0)
PLATELETS: 253 10*3/uL (ref 150–400)
RBC: 3.81 MIL/uL — ABNORMAL LOW (ref 3.87–5.11)
RDW: 17.3 % — AB (ref 11.5–15.5)
WBC: 14.2 10*3/uL — ABNORMAL HIGH (ref 4.0–10.5)

## 2017-08-02 LAB — TYPE AND SCREEN
ABO/RH(D): O POS
ANTIBODY SCREEN: NEGATIVE

## 2017-08-02 LAB — TRIGLYCERIDES: Triglycerides: 173 mg/dL — ABNORMAL HIGH (ref <150)

## 2017-08-02 LAB — TROPONIN I: TROPONIN I: 0.04 ng/mL — AB (ref <0.03)

## 2017-08-02 MED ORDER — METOPROLOL TARTRATE 5 MG/5ML IV SOLN: 5.0000 mg | Freq: Three times a day (TID) | INTRAVENOUS | Status: DC | PRN

## 2017-08-02 MED ORDER — CHLORHEXIDINE GLUCONATE CLOTH 2 % EX PADS
6.0000 | MEDICATED_PAD | Freq: Every day | CUTANEOUS | Status: DC
  Administered 2017-08-03 – 2017-08-04 (×2): 6 via TOPICAL

## 2017-08-02 MED ORDER — SODIUM BICARBONATE 8.4 % IV SOLN: 50.0000 meq | Freq: Once | INTRAVENOUS | Status: DC

## 2017-08-02 MED ORDER — MIDAZOLAM HCL 2 MG/2ML IJ SOLN
INTRAMUSCULAR | Status: AC
  Administered 2017-08-02: 4 mg
  Filled 2017-08-02: qty 4

## 2017-08-02 MED ORDER — SODIUM BICARBONATE 8.4 % IV SOLN
INTRAVENOUS | Status: DC
  Administered 2017-08-02 (×2): via INTRAVENOUS
  Filled 2017-08-02: qty 150

## 2017-08-02 MED ORDER — INSULIN ASPART 100 UNIT/ML ~~LOC~~ SOLN
1.0000 [IU] | SUBCUTANEOUS | Status: DC
  Administered 2017-08-03 (×5): 1 [IU] via SUBCUTANEOUS
  Administered 2017-08-04: 2 [IU] via SUBCUTANEOUS
  Administered 2017-08-04 (×2): 1 [IU] via SUBCUTANEOUS
  Administered 2017-08-05: 2 [IU] via SUBCUTANEOUS
  Administered 2017-08-05: 3 [IU] via SUBCUTANEOUS
  Administered 2017-08-05 (×3): 2 [IU] via SUBCUTANEOUS
  Administered 2017-08-05: 3 [IU] via SUBCUTANEOUS

## 2017-08-02 MED ORDER — FENTANYL BOLUS VIA INFUSION
50.0000 ug | INTRAVENOUS | Status: DC | PRN
  Administered 2017-08-04 (×2): 50 ug/h via INTRAVENOUS
  Filled 2017-08-02: qty 50

## 2017-08-02 MED ORDER — ORAL CARE MOUTH RINSE
15.0000 mL | OROMUCOSAL | Status: DC
  Administered 2017-08-02 – 2017-08-06 (×28): 15 mL via OROMUCOSAL

## 2017-08-02 MED ORDER — FENTANYL CITRATE (PF) 100 MCG/2ML IJ SOLN
INTRAMUSCULAR | Status: AC
  Administered 2017-08-02: 100 ug
  Filled 2017-08-02: qty 4

## 2017-08-02 MED ORDER — METOCLOPRAMIDE HCL 5 MG/5ML PO SOLN
10.0000 mg | Freq: Three times a day (TID) | ORAL | Status: DC
  Administered 2017-08-02 – 2017-08-04 (×4): 10 mg via ORAL
  Filled 2017-08-02 (×5): qty 10

## 2017-08-02 MED ORDER — METOCLOPRAMIDE HCL 5 MG/5ML PO SOLN
10.0000 mg | Freq: Three times a day (TID) | ORAL | Status: DC
  Filled 2017-08-02: qty 10

## 2017-08-02 MED ORDER — SODIUM BICARBONATE 8.4 % IV SOLN
INTRAVENOUS | Status: AC
  Administered 2017-08-02: 50 meq
  Filled 2017-08-02: qty 50

## 2017-08-02 MED ORDER — MIDAZOLAM HCL 2 MG/2ML IJ SOLN: 2.0000 mg | INTRAMUSCULAR | Status: DC | PRN

## 2017-08-02 MED ORDER — FENTANYL CITRATE (PF) 100 MCG/2ML IJ SOLN: 50.0000 ug | Freq: Once | INTRAMUSCULAR | Status: DC

## 2017-08-02 MED ORDER — CHLORHEXIDINE GLUCONATE 0.12% ORAL RINSE (MEDLINE KIT)
15.0000 mL | Freq: Two times a day (BID) | OROMUCOSAL | Status: DC
Start: 2017-08-02 — End: 2017-08-06
  Administered 2017-08-02 – 2017-08-06 (×6): 15 mL via OROMUCOSAL

## 2017-08-02 MED ORDER — RANITIDINE HCL 150 MG/10ML PO SYRP
150.0000 mg | ORAL_SOLUTION | Freq: Two times a day (BID) | ORAL | Status: DC
  Administered 2017-08-02 – 2017-08-04 (×6): 150 mg
  Filled 2017-08-02 (×7): qty 10

## 2017-08-02 MED ORDER — LACTULOSE 10 GM/15ML PO SOLN
20.0000 g | Freq: Two times a day (BID) | ORAL | Status: DC
  Administered 2017-08-02 – 2017-08-04 (×4): 20 g
  Filled 2017-08-02 (×4): qty 30

## 2017-08-02 MED ORDER — SODIUM CHLORIDE 0.9% FLUSH: 10.0000 mL | INTRAVENOUS | Status: DC | PRN

## 2017-08-02 MED ORDER — PROPOFOL 1000 MG/100ML IV EMUL: 0.0000 ug/kg/min | INTRAVENOUS | Status: DC

## 2017-08-02 MED ORDER — LIDOCAINE HCL 1 % IJ SOLN
INTRAMUSCULAR | Status: AC
  Filled 2017-08-02: qty 20

## 2017-08-02 MED ORDER — DEXAMETHASONE SODIUM PHOSPHATE 4 MG/ML IJ SOLN
4.0000 mg | INTRAMUSCULAR | Status: DC
  Administered 2017-08-02 – 2017-08-06 (×4): 4 mg via INTRAVENOUS
  Filled 2017-08-02 (×4): qty 1

## 2017-08-02 MED ORDER — SODIUM CHLORIDE 0.9% FLUSH
10.0000 mL | Freq: Two times a day (BID) | INTRAVENOUS | Status: DC
  Administered 2017-08-02 – 2017-08-06 (×8): 10 mL

## 2017-08-02 MED ORDER — FENTANYL 2500MCG IN NS 250ML (10MCG/ML) PREMIX INFUSION
25.0000 ug/h | INTRAVENOUS | Status: DC
  Administered 2017-08-02: 50 ug/h via INTRAVENOUS
  Administered 2017-08-02: 25 ug/h via INTRAVENOUS
  Administered 2017-08-03 – 2017-08-06 (×4): 125 ug/h via INTRAVENOUS
  Filled 2017-08-02 (×5): qty 250

## 2017-08-02 MED ORDER — LACTULOSE 10 GM/15ML PO SOLN: 10.0000 g | Freq: Two times a day (BID) | ORAL | Status: DC

## 2017-08-02 MED ORDER — METOPROLOL TARTRATE 5 MG/5ML IV SOLN
5.0000 mg | Freq: Three times a day (TID) | INTRAVENOUS | Status: DC | PRN
  Administered 2017-08-02 – 2017-08-03 (×4): 5 mg via INTRAVENOUS
  Filled 2017-08-02 (×5): qty 5

## 2017-08-02 MED ORDER — MIDAZOLAM HCL 2 MG/2ML IJ SOLN
2.0000 mg | INTRAMUSCULAR | Status: DC | PRN
  Administered 2017-08-02: 2 mg via INTRAVENOUS
  Filled 2017-08-02: qty 2

## 2017-08-02 NOTE — Progress Notes (Addendum)
   Worsening resp distress   Intubated  Plan cxr post itnubation Blood labs sent PRVC Cont sedation protocol Place PICC line Family o fhusband, son, daughter, grandaughter updated - full code   Dr. Brand Males, M.D., F.C.C.P Pulmonary and Critical Care Medicine Staff Physician, [place] Director - Interstitial Lung Disease  Program  Pulmonary [place] [facility] [place], [place], [address]  Pager: [phone], If no answer or between  15:00h - 7:00h: call [phone] Telephone: [phone]

## 2017-08-02 NOTE — Progress Notes (Signed)
Physician at bedside; decision to intubate. Supplies gathered; RT; and nursing team at beside. Family updated per physician; agree with POC. Per Physician orders: Fentanyl 100 mg at 1851. Versed 4 mg at 1851. Etomadate at 20mg  @ 1851. Roc 50 at 1852. Pt. Was intubated by Dr. Chase Caller. ETT 7.5; OG placed; verified. PCXR completed.  Mariann Laster, RN

## 2017-08-02 NOTE — Progress Notes (Signed)
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 s/p liver biopsy, pathology confirmed small cell cancer Mediastinal lymphadenopathy with occlusion of right lower lobe bronchus causing partial right lower lobe collapse MRI brain is negative for intracranial mets Radiation oncologist has been consulted; palliative radiation treatment to resume after extensive discussion with patient and family members I recommend we proceed with palliative radiation due to mediastinal lymphadenopathy and right lower lung collapse contributing to her respiratory failure Once radiation treatment is completed, we will start chemotherapy  Elevated liver enzymes and mental status change Due to diffuse liver involvement Ammonia level is elevated Continue supportive care  Chronic respiratory failure due to COPD, oxygen dependent Probable post-obstructive pneumonia Continue inhalers and oxygen along with antibiotics She is receiving aggressive oxygen support and nebulizers After extensive discussion with patient and family members, they agree for full resuscitation mode including the possibility of mechanical ventilator/intubation for respiratory support I have changed oral dexamethasone to IV dexamethasone daily  Moderate to severe protein calorie malnutrition Dysphagia, high risk of aspiration Due to cancer We discussed the risk and benefits of feeding tube placement.  Ultimately, family members would like to try NG tube placement and feeding.  If she tolerates that and continues to be weak, we can consider permanent gastrostomy tube placement Advancing diet as tolerated We will schedule Reglan to reduce risk of nausea We will check electrolytes daily for the next 3 days I have discontinue some nonessential oral medications  Severe cancer pain Pain control is reasonably stable We discussed pain management with slow titration as needed  Chronic  constipation with nausea I recommend scheduled Reglan and lactulose I will consider discontinuation of scopolamine patch to reduce the risk of excessive sedation and confusion  Tachycardia, possible PE She is on IV heparin I have discontinue aspirin therapy due to increased risk of bleeding  CODE STATUS After extensive discussion with the husband, he is in agreement to  change her CODE STATUS to full code based on prior discussion  Goals of care discussion Due to her altered mental status, her husband is the dedicated healthcare power attorney  Discharge planning Unlikely over the next few weeks  Heath Lark, MD 08/02/2017  8:04 AM   Subjective:  Patient is sleeping.  Family members are present.  She tolerated NG to with nutritional supplement.  According to her daughter, the patient was intermittently alert yesterday.  She felt that her pain and nausea is reasonably controlled  Objective:  Vitals:   08/02/17 0728 08/02/17 0745  BP:    Pulse: (!) 123   Resp: (!) 22   Temp:  98.8 F (37.1 C)  SpO2: 100%      Intake/Output Summary (Last 24 hours) at 08/02/2017 0804 Last data filed at 08/02/2017 6073 Gross per 24 hour  Intake 1454.73 ml  Output 925 ml  Net 529.73 ml    GENERAL: Sleeping, appears comfortable with no distress LUNGS: clear to auscultation with mildly increased breathing effort HEART: Tachycardia, no murmurs, mild bilateral lower extremity edema ABDOMEN:abdomen soft, non-tender and reduced bowel sounds   Labs:  Lab Results  Component Value Date   WBC 14.2 (H) 08/02/2017   HGB 11.1 (L) 08/02/2017   HCT 34.9 (L) 08/02/2017   MCV 91.6 08/02/2017   PLT 253 08/02/2017   NEUTROABS 7.0 07/27/2017    Lab Results  Component Value Date   NA 143 08/02/2017   K 4.2 08/02/2017  CL 110 08/02/2017   CO2 28 08/02/2017    Studies:  Dg Abd 1 View  Result Date: 07/31/2017 CLINICAL DATA:  Abdominal pain. EXAM: ABDOMEN - 1 VIEW COMPARISON:  None. FINDINGS:  The bowel gas pattern is normal. No evidence of bowel obstruction or pneumoperitoneum. Surgical clips within the abdomen and surgical sutures within the pelvis noted. No acute bony abnormalities identified. Vascular calcifications are present. IMPRESSION: No acute abnormality.  Unremarkable bowel gas pattern. Electronically Signed   By: Margarette Canada M.D.   On: 07/31/2017 15:15   Nm Pulmonary Perf And Vent  Result Date: 08/01/2017 CLINICAL DATA:  Lung cancer, elevated D-dimer, evaluate for PE EXAM: NUCLEAR MEDICINE VENTILATION - PERFUSION LUNG SCAN TECHNIQUE: Ventilation images were obtained in multiple projections using inhaled aerosol Tc-63m DTPA. Perfusion images were obtained in multiple projections after intravenous injection of Tc-60m-MAA. RADIOPHARMACEUTICALS:  28.3 mCi of Tc-52m DTPA aerosol inhalation and 3.9 mCi Tc33m-MAA IV COMPARISON:  Chest radiograph dated 07/31/2017. CT chest dated 07/22/2017. FINDINGS: Ventilation: Heterogeneous ventilation throughout the left lung and in the right upper lobe. No right lower lobe ventilation is present, corresponding to right lower lobe collapse on prior CT. Perfusion: Normal fusion in the right lung. Diminished/heterogeneous perfusion in the right upper and lower lobes. When correlating with recent chest radiograph, there is a diffuse alveolar process in the right upper lobe and left lung, favoring interstitial edema, less likely multifocal infection. Persistent right lower lobe collapse with a new small right pleural effusion. The radiographic appearance likely accounts for the heterogeneous perfusion. Technically speaking, although pulmonary embolism is not suspected, by definition this reflects an intermediate probability study. IMPRESSION: Markedly limited evaluation in the setting of known right lower lobe collapse and abnormal chest radiograph which favors interstitial edema or less likely multifocal infection. While pulmonary embolism is not suspected, by  definition the study is intermediate probability for pulmonary embolism. In patients with abnormal chest radiograph, CT with contrast is generally preferred for this reason if/when able. Electronically Signed   By: Julian Hy M.D.   On: 08/01/2017 00:08   Dg Chest Port 1 View  Result Date: 08/01/2017 CLINICAL DATA:  Sob; lung ca EXAM: PORTABLE CHEST 1 VIEW COMPARISON:  07/31/2017 FINDINGS: Heart size is normal. Diffuse reticular opacities are identified throughout the lungs bilaterally. More focal opacity is identified in the MEDIAL RIGHT lung base, stable in appearance. There is a RIGHT pleural effusion. RIGHT paratracheal density is consistent with known adenopathy. IMPRESSION: Persistent significant lung opacities, more confluent at the RIGHT lung base. Mediastinal adenopathy. Electronically Signed   By: Nolon Nations M.D.   On: 08/01/2017 13:05   Dg Chest Port 1 View  Result Date: 07/31/2017 CLINICAL DATA:  Hypoxia, cough EXAM: PORTABLE CHEST 1 VIEW COMPARISON:  07/24/2017 FINDINGS: Diffuse interstitial and alveolar opacities throughout the lungs, new since prior study, likely edema. Moderate to large right pleural effusion with right lower lobe atelectasis or infiltrate. Heart is borderline in size. Hyperinflation. No acute bony abnormality. IMPRESSION: Borderline heart size. New diffuse interstitial and alveolar opacities, favor edema. Moderate to large right effusion with right lower lobe atelectasis or infiltrate. Electronically Signed   By: Rolm Baptise M.D.   On: 07/31/2017 19:56   Dg Abd Portable 1v  Result Date: 08/01/2017 CLINICAL DATA:  S/p NG tube placement EXAM: PORTABLE ABDOMEN - 1 VIEW COMPARISON:  None. FINDINGS: Nasogastric tube is in place, tip overlying the level of the proximal to mid stomach. Bowel gas pattern is nonobstructive. Persistent significant lung  opacities and RIGHT pleural effusion. IMPRESSION: Interval placement of nasogastric tube, tip overlying the level of  the proximal stomach. Electronically Signed   By: Nolon Nations M.D.   On: 08/01/2017 16:21

## 2017-08-02 NOTE — Progress Notes (Signed)
Nutrition Follow-up  DOCUMENTATION CODES:   Non-severe (moderate) malnutrition in context of chronic illness  INTERVENTION:  - Continue to advance Osmolite 1.2 by 10 mL/hr every 12 hours to reach goal rate of Osmolite 1.2 @ 55 mL/hr. - At goal rate, this regimen will provide 1584 kcal, 73 grams of protein, and 1082 mL free water.  - Continue 100 mL free water every 4 hours (600 mL/day).  Monitor magnesium, potassium, and phosphorus daily for at least 3 days, MD to replete as needed, as pt is at risk for refeeding syndrome given moderate malnutrition, poor PO intakes for >1 month.    NUTRITION DIAGNOSIS:   Moderate Malnutrition related to chronic illness(COPD/recurrent PNA) as evidenced by energy intake < 75% for > or equal to 1 month, percent weight loss. -ongoing  GOAL:   Patient will meet greater than or equal to 90% of their needs -unmet with current goal rate  MONITOR:   TF tolerance, Weight trends, Labs, I & O's  ASSESSMENT:   Patient with PMH significant for renal cell carcinoma, tobacco abuse, COPD, and stroke. Presents this admission with complaints of chest pain. Admitted for right lower lobe lung collapse with possible lung mass and possible mass in the liver.   Weight today consistent with yesterday's weight. Patient with NGT and TF started yesterday afternoon. Patient being taken out of room to radiation at the time of RD visit and TF was off at that time. RN reports Osmolite 1.2 @ 35 mL/hr prior to being turned off. Patient's daughter was at bedside and reported that patient had complained of nausea at the time of TF initiation, was given Zofran, and has not mentioned nausea or any other abdominal discomfort since that time.   Palliative Care following and last met with patient and her husband this AM. Patient remains full code with plan to continue aggressive care, continue radiation, with hope for ability to start chemo in the future.   Medications reviewed; 20 mg  lactulose BID, 36629 mg Creon TID, 10 mg oral Reglan TID, 1 tablet Prosight/day, 1 tablet Miralax/day.  Labs reviewed; CBGs: 131 and 104 mg/dL today, creatinine: 0.39 mg/dL, Ca: 8.4 mg/dL, LFTs and Alk Phos elevated.       Diet Order:   Diet Order           Diet Carb Modified Fluid consistency: Thin; Room service appropriate? Yes  Diet effective now          EDUCATION NEEDS:   Education needs have been addressed  Skin:  Skin Assessment: Skin Integrity Issues: Skin Integrity Issues:: Incisions Incisions: R abdomen (7/23)  Last BM:  7/27  Height:   Ht Readings from Last 1 Encounters:  07/31/17 '5\' 2"'$  (1.575 m)    Weight:   Wt Readings from Last 1 Encounters:  08/02/17 117 lb 15.1 oz (53.5 kg)    Ideal Body Weight:  50 kg  BMI:  Body mass index is 21.57 kg/m.  Estimated Nutritional Needs:   Kcal:  1525-1775 (30-35 kcal/kg)  Protein:  70-80 grams   Fluid:  >1.4 L/day     Jarome Matin, MS, RD, LDN, [facility] Inpatient Clinical Dietitian Pager # [phone] After hours/weekend pager # [phone]

## 2017-08-02 NOTE — Progress Notes (Addendum)
PROGRESS NOTE    Wanda James  FIE:[id number] DOB: 1955-10-28 DOA: 08/01/2017 PCP: Gaye Alken, PA-C   Brief Narrative: Patient is a 62 year old female with past medical history of renal cell carcinoma, smoker, COPD on home oxygen who presented to the emergency department with complaints of sharp chest pain.  Patient was found to be short of breath, wheezing and had cough.  CT imaging done on presentation showed partial right lower lobe collapse, right infrahilar mass, mediastinal lymphadenopathy, liver mass concerning for metastatic disease.  Currently palliative care, oncology and pulmonary following.  Patient has also been started on heparin drip for possible pulmonary embolism.  She has been started on radiation therapy with the plan of chemotherapy.  Assessment & Plan:   Principal Problem:   Chest pain Active Problems:   COPD exacerbation (HCC)   Acute hypoxemic respiratory failure (HCC)   HCAP (healthcare-associated pneumonia)   Cancer associated pain   Liver metastases (HCC)   Other constipation   Lung cancer (HCC)   Nausea & vomiting   Abdominal pain   Hypoxia   Elevated troponin   Generalized anxiety disorder  Hypoxia/acute respiratory failure: Secondary to pneumonia versus PE vs right lower lobe collapse. Pulmonary following.  Patient unstable for bronchoscopy.  Started on heparin drip for possible pulmonary embolism.  Currently she is on 7 L/min oxygen.  Stable is negative for DVT.  Right hilar mass/liver metastasis: Large right lung mass with liver masses.  Oncology and pulmonary following.  Underwent liver biopsy on 07/26/2017.  Pathology confirmed small cell cancer.  MRI brain on 7/20 third negative for metastatic disease.  Currently on Decadron.  Plan is to continue palliative radiation therapy for now and start on chemotherapy later. Patient also has history of renal cell carcinoma status post left renal nephrectomy.  Elevated troponin: Most likely secondary to  supply demand ischemia.  Troponin did not trend up.  Echocardiogram did not show any wall motion abnormality, ejection fraction of 65 to 70%, severe pulmonary hypertension.  Difficulty swallowing/dysphagia: Speech therapy following.  Currently she is on NG tube.  Started on tube feeding.  Plan for permanent gastrostomy tube placement if she continues to have poor oral intake.  Altered mental status: Secondary to elevated ammonia level, possible pain medications.  Elevated ammonia level due to diffuse liver involvement.  Currently on lactulose.  We will continue to monitor ammonia level.  Abdominal distention/pain: May be secondary to metastatic disease.  Family declined CT abdomen/pelvis.  Postoperative pneumonia: CT imaging suggestive of possible postobstructive pneumonia versus atelectasis.  Pulmonary following.  Blood cultures negative so far.  Continue antibiotics.  History of COPD: On oxygen at home.  Continue oxygen supplementation.  Has history of smoking for more than 40 years.  Continue nicotine patch.  Diabetes type 2: Continue sliding-scale insulin  Severe protein calorie malnourishment: Continue tube feeding.  Nutrition following.  Anxiety: Continue Lexapro  Constipation: Continue Reglan and lactulose.  Multiple comorbidities/poor prognosis: Palliative care following.  Currently full code.  DVT prophylaxis: Heparin IV Code Status: Full Family Communication: Granddaughter and son present at the bedside Disposition Plan: Undetermined at this point   Consultants: Palliative care, oncology, pulmonary  Procedures: None  Antimicrobials:  Subjective: Patient seen and examined the bedside this morning.  Remains lethargic, somnolent.  Moaning with pain.  Hardly able to open her eyes.tachycardiac  Objective: Vitals:   08/02/17 0500 08/02/17 0600 08/02/17 0728 08/02/17 0745  BP:      Pulse:  (!) 123 (!) 123  Resp: (!) 24 (!) 24 (!) 22   Temp:    98.8 F (37.1 C)    TempSrc:    Axillary  SpO2:  98% 100%   Weight:  53.5 kg (117 lb 15.1 oz)    Height:        Intake/Output Summary (Last 24 hours) at 08/02/2017 0816 Last data filed at 08/02/2017 8242 Gross per 24 hour  Intake 1454.73 ml  Output 925 ml  Net 529.73 ml   Filed Weights   07/24/17 0055 07/31/17 2015 08/02/17 0600  Weight: 50.7 kg (111 lb 12.4 oz) 53.5 kg (117 lb 15.1 oz) 53.5 kg (117 lb 15.1 oz)    Examination:  General exam:  in moderate distress secondary to pain.  Chronically ill looking HEENT:PERRL,Oral mucosa moist, Ear/Nose normal on gross exam Respiratory system: Bilateral decreased air entry on the bases, crackles on the bases  cardiovascular system: Sinus tachycardia. No JVD, murmurs, rubs, gallops or clicks. No pedal edema. Gastrointestinal system: Abdomen nondistended, soft and nontender. No organomegaly or masses felt. Normal bowel sounds heard. Central nervous system: Not alert or oriented.  Extremities: No edema, no clubbing ,no cyanosis, distal peripheral pulses palpable. Skin: No rashes, lesions or ulcers,no icterus ,no pallor     Data Reviewed: I have personally reviewed following labs and imaging studies  CBC: Recent Labs  Lab 07/27/17 0504  07/30/17 0434 07/31/17 0349 07/31/17 1906 08/01/17 0145 08/02/17 0313  WBC 7.6   < > 11.4* 14.2* 14.5* 13.7* 14.2*  NEUTROABS 7.0  --   --   --   --   --   --   HGB 12.1   < > 11.4* 11.1* 11.3* 11.1* 11.1*  HCT 38.1   < > 36.1 35.8* 35.7* 35.4* 34.9*  MCV 90.7   < > 92.3 93.0 91.3 93.4 91.6  PLT 290   < > 253 232 266 204 253   < > = values in this interval not displayed.   Basic Metabolic Panel: Recent Labs  Lab 07/27/17 0504  07/30/17 0434 07/31/17 0349 07/31/17 1906 08/01/17 0145 08/02/17 0313  NA 139   < > 139 142 143 142 143  K 4.7   < > 4.7 4.8 4.3 4.2 4.2  CL 103   < > 102 106 108 109 110  CO2 29   < > 27 25 25 24 28   GLUCOSE 101*   < > 95 88 107* 113* 118*  BUN 23   < > 43* 33* 25* 23 20   CREATININE 0.54   < > 0.68 0.56 0.45 0.50 0.39*  CALCIUM 8.5*   < > 8.6* 8.5* 8.3* 8.2* 8.4*  MG 2.3  --   --   --   --   --   --    < > = values in this interval not displayed.   GFR: Estimated Creatinine Clearance: 56.9 mL/min (A) (by C-G formula based on SCr of 0.39 mg/dL (L)). Liver Function Tests: Recent Labs  Lab 07/30/17 0434 07/31/17 0349 07/31/17 1906 08/01/17 0145 08/02/17 0313  AST 124* 115* 115* 110* 149*  ALT 99* 86* 79* 73* 84*  ALKPHOS 509* 483* 406* 406* 468*  BILITOT 1.8* 2.2* 2.3* 2.3* 2.2*  PROT 5.5* 5.3* 5.0* 5.0* 5.3*  ALBUMIN 2.4* 2.3* 2.1* 2.0* 2.0*   No results for input(s): LIPASE, AMYLASE in the last 168 hours. Recent Labs  Lab 07/29/17 1100 07/30/17 0434 07/31/17 0349 08/01/17 0145 08/02/17 0313  AMMONIA 97* 74* 73* 62* 87*  Coagulation Profile: No results for input(s): INR, PROTIME in the last 168 hours. Cardiac Enzymes: Recent Labs  Lab 07/29/17 1328 07/31/17 1906 08/01/17 0145 08/01/17 0652  TROPONINI <0.03 0.04* 0.06* 0.06*   BNP (last 3 results) No results for input(s): PROBNP in the last 8760 hours. HbA1C: No results for input(s): HGBA1C in the last 72 hours. CBG: Recent Labs  Lab 08/01/17 1948 08/02/17 0009 08/02/17 0312  GLUCAP 154* 131* 104*   Lipid Profile: No results for input(s): CHOL, HDL, LDLCALC, TRIG, CHOLHDL, LDLDIRECT in the last 72 hours. Thyroid Function Tests: No results for input(s): TSH, T4TOTAL, FREET4, T3FREE, THYROIDAB in the last 72 hours. Anemia Panel: No results for input(s): VITAMINB12, FOLATE, FERRITIN, TIBC, IRON, RETICCTPCT in the last 72 hours. Sepsis Labs: No results for input(s): PROCALCITON, LATICACIDVEN in the last 168 hours.  Recent Results (from the past 240 hour(s))  Culture, blood (routine x 2) Call MD if unable to obtain prior to antibiotics being given     Status: None   Collection Time: 07/31/2017 11:59 PM  Result Value Ref Range Status   Specimen Description   Final    BLOOD  LEFT ANTECUBITAL Performed at [facility] [address], [place], [place] [address]    Special Requests   Final    BOTTLES DRAWN AEROBIC AND ANAEROBIC Blood Culture results may not be optimal due to an excessive volume of blood received in culture bottles Performed at [facility] [address] [place], [place] [address]    Culture   Final    NO GROWTH 5 DAYS Performed at [facility], [place] [address], [place], [place] [address]    Report Status 07/29/2017 FINAL  Final  Culture, blood (routine x 2) Call MD if unable to obtain prior to antibiotics being given     Status: None   Collection Time: 07/25/17  4:45 AM  Result Value Ref Range Status   Specimen Description   Final    BLOOD RIGHT ANTECUBITAL Performed at [facility] [address] [place], [place] [address]    Special Requests   Final    BOTTLES DRAWN AEROBIC ONLY Blood Culture adequate volume Performed at [facility] [address] [place], [place] [address]    Culture   Final    NO GROWTH 5 DAYS Performed at [facility], [place] [address], [place], [place] [address]    Report Status 07/30/2017 FINAL  Final  MRSA PCR Screening     Status: None   Collection Time: 07/25/17  8:04 AM  Result Value Ref Range Status   MRSA by PCR NEGATIVE NEGATIVE Final    Comment:        The GeneXpert MRSA Assay (FDA approved for NASAL specimens only), is one component of a comprehensive MRSA colonization surveillance program. It is not intended to diagnose MRSA infection nor to guide or monitor treatment for MRSA infections. Performed at [facility], [place] [address] [place],  [address]   Gastrointestinal Panel by PCR , Stool     Status: Abnormal   Collection Time: 07/29/17  4:45 PM  Result Value Ref Range Status   Campylobacter species NOT DETECTED NOT DETECTED Final   Plesimonas shigelloides NOT DETECTED NOT  DETECTED Final   Salmonella species NOT DETECTED NOT DETECTED Final   Yersinia enterocolitica NOT DETECTED NOT DETECTED Final   Vibrio species NOT DETECTED NOT DETECTED Final   Vibrio cholerae NOT DETECTED  NOT DETECTED Final   Enteroaggregative E coli (EAEC) NOT DETECTED NOT DETECTED Final   Enteropathogenic E coli (EPEC) DETECTED (A) NOT DETECTED Final    Comment: RESULT CALLED TO, READ BACK BY AND VERIFIED WITH: DANA GUNDLACH 07/30/17 @ 1509  [place]    Enterotoxigenic E coli (ETEC) NOT DETECTED NOT DETECTED Final   Shiga like toxin producing E coli (STEC) NOT DETECTED NOT DETECTED Final   Shigella/Enteroinvasive E coli (EIEC) NOT DETECTED NOT DETECTED Final   Cryptosporidium NOT DETECTED NOT DETECTED Final   Cyclospora cayetanensis NOT DETECTED NOT DETECTED Final   Entamoeba histolytica NOT DETECTED NOT DETECTED Final   Giardia lamblia NOT DETECTED NOT DETECTED Final   Adenovirus F40/41 NOT DETECTED NOT DETECTED Final   Astrovirus NOT DETECTED NOT DETECTED Final   Norovirus GI/GII NOT DETECTED NOT DETECTED Final   Rotavirus A NOT DETECTED NOT DETECTED Final   Sapovirus (I, II, IV, and V) NOT DETECTED NOT DETECTED Final    Comment: Performed at [facility], [address] [place], [place] [address]         Radiology Studies: Dg Abd 1 View  Result Date: 07/31/2017 CLINICAL DATA:  Abdominal pain. EXAM: ABDOMEN - 1 VIEW COMPARISON:  None. FINDINGS: The bowel gas pattern is normal. No evidence of bowel obstruction or pneumoperitoneum. Surgical clips within the abdomen and surgical sutures within the pelvis noted. No acute bony abnormalities identified. Vascular calcifications are present. IMPRESSION: No acute abnormality.  Unremarkable bowel gas pattern. Electronically Signed   By: Margarette Canada M.D.   On: 07/31/2017 15:15   Nm Pulmonary Perf And Vent  Result Date: 08/01/2017 CLINICAL DATA:  Lung cancer, elevated D-dimer, evaluate for PE EXAM: NUCLEAR MEDICINE VENTILATION -  PERFUSION LUNG SCAN TECHNIQUE: Ventilation images were obtained in multiple projections using inhaled aerosol Tc-54m DTPA. Perfusion images were obtained in multiple projections after intravenous injection of Tc-38m-MAA. RADIOPHARMACEUTICALS:  28.3 mCi of Tc-73m DTPA aerosol inhalation and 3.9 mCi Tc49m-MAA IV COMPARISON:  Chest radiograph dated 07/31/2017. CT chest dated 07/05/2017. FINDINGS: Ventilation: Heterogeneous ventilation throughout the left lung and in the right upper lobe. No right lower lobe ventilation is present, corresponding to right lower lobe collapse on prior CT. Perfusion: Normal fusion in the right lung. Diminished/heterogeneous perfusion in the right upper and lower lobes. When correlating with recent chest radiograph, there is a diffuse alveolar process in the right upper lobe and left lung, favoring interstitial edema, less likely multifocal infection. Persistent right lower lobe collapse with a new small right pleural effusion. The radiographic appearance likely accounts for the heterogeneous perfusion. Technically speaking, although pulmonary embolism is not suspected, by definition this reflects an intermediate probability study. IMPRESSION: Markedly limited evaluation in the setting of known right lower lobe collapse and abnormal chest radiograph which favors interstitial edema or less likely multifocal infection. While pulmonary embolism is not suspected, by definition the study is intermediate probability for pulmonary embolism. In patients with abnormal chest radiograph, CT with contrast is generally preferred for this reason if/when able. Electronically Signed   By: Julian Hy M.D.   On: 08/01/2017 00:08   Dg Chest Port 1 View  Result Date: 08/01/2017 CLINICAL DATA:  Sob; lung ca EXAM: PORTABLE CHEST 1 VIEW COMPARISON:  07/31/2017 FINDINGS: Heart size is normal. Diffuse reticular opacities are identified throughout the lungs bilaterally. More focal opacity is identified in  the MEDIAL RIGHT lung base, stable in appearance. There is a RIGHT pleural effusion. RIGHT paratracheal density is consistent with known adenopathy.  IMPRESSION: Persistent significant lung opacities, more confluent at the RIGHT lung base. Mediastinal adenopathy. Electronically Signed   By: Nolon Nations M.D.   On: 08/01/2017 13:05   Dg Chest Port 1 View  Result Date: 07/31/2017 CLINICAL DATA:  Hypoxia, cough EXAM: PORTABLE CHEST 1 VIEW COMPARISON:  07/08/2017 FINDINGS: Diffuse interstitial and alveolar opacities throughout the lungs, new since prior study, likely edema. Moderate to large right pleural effusion with right lower lobe atelectasis or infiltrate. Heart is borderline in size. Hyperinflation. No acute bony abnormality. IMPRESSION: Borderline heart size. New diffuse interstitial and alveolar opacities, favor edema. Moderate to large right effusion with right lower lobe atelectasis or infiltrate. Electronically Signed   By: Rolm Baptise M.D.   On: 07/31/2017 19:56   Dg Abd Portable 1v  Result Date: 08/01/2017 CLINICAL DATA:  S/p NG tube placement EXAM: PORTABLE ABDOMEN - 1 VIEW COMPARISON:  None. FINDINGS: Nasogastric tube is in place, tip overlying the level of the proximal to mid stomach. Bowel gas pattern is nonobstructive. Persistent significant lung opacities and RIGHT pleural effusion. IMPRESSION: Interval placement of nasogastric tube, tip overlying the level of the proximal stomach. Electronically Signed   By: Nolon Nations M.D.   On: 08/01/2017 16:21        Scheduled Meds: . budesonide (PULMICORT) nebulizer solution  0.25 mg Nebulization BID  . dexamethasone  4 mg Intravenous Q24H  . diclofenac sodium  4 g Topical TID  . escitalopram  5 mg Oral Daily  . free water  100 mL Per Tube Q4H  . ipratropium-albuterol  3 mL Nebulization Q4H  . lactulose  20 g Per Tube BID  . lactulose  300 mL Rectal Once  . lipase/protease/amylase  12,000 Units Oral TID AC  . mouth rinse  15 mL  Mouth Rinse BID  . metoCLOPramide  10 mg Per Tube TID AC  . multivitamin  1 tablet Oral Daily  . polyethylene glycol  17 g Oral Daily  . ranitidine  150 mg Per Tube BID  . scopolamine  1 patch Transdermal Q72H   Continuous Infusions: . feeding supplement (OSMOLITE 1.2 CAL) 1,000 mL (08/01/17 1707)  . heparin 900 Units/hr (08/01/17 1249)     LOS: 10 days    Time spent: More than 50% of that time was spent in counseling and/or coordination of care.      Shelly Coss, MD [facility] Pager [phone]  If 7PM-7AM, please contact night-coverage [email or web address] Password [email or web address] 08/02/2017, 8:16 AM

## 2017-08-02 NOTE — Progress Notes (Signed)
Peripherally Inserted Central Catheter/Midline Placement  The IV Nurse has discussed with the patient and/or persons authorized to consent for the patient, the purpose of this procedure and the potential benefits and risks involved with this procedure.  The benefits include less needle sticks, lab draws from the catheter, and the patient may be discharged home with the catheter. Risks include, but not limited to, infection, bleeding, blood clot (thrombus formation), and puncture of an artery; nerve damage and irregular heartbeat and possibility to perform a PICC exchange if needed/ordered by physician.  Alternatives to this procedure were also discussed.  Bard Power PICC patient education guide, fact sheet on infection prevention and patient information card has been provided to patient /or left at bedside.    PICC/Midline Placement Documentation  PICC Triple Lumen 08/02/17 PICC Right Cephalic 35 cm 0 cm (Active)  Indication for Insertion or Continuance of Line Vasoactive infusions 08/02/2017 10:38 PM  Exposed Catheter (cm) 0 cm 08/02/2017 10:38 PM  Site Assessment Clean;Dry;Intact 08/02/2017 10:38 PM  Lumen #1 Status Flushed;Saline locked;Blood return noted 08/02/2017 10:38 PM  Lumen #2 Status Flushed;Saline locked;Blood return noted 08/02/2017 10:38 PM  Lumen #3 Status Saline locked;Flushed;Blood return noted 08/02/2017 10:38 PM  Dressing Type Transparent 08/02/2017 10:38 PM  Dressing Status Clean;Dry;Intact;Antimicrobial disc in place 08/02/2017 10:38 PM  Dressing Change Due 08/09/17 08/02/2017 10:38 PM       Gordan Payment 08/02/2017, 10:40 PM

## 2017-08-02 NOTE — Procedures (Signed)
Intubation Procedure Note Wanda James [id number] May 16, 1955  Procedure: Intubation Indications: called to bedside due to worsdning resp ditress. Higher o2 need 15L [place]. CXR worsnign collapse, ABG with seere hypoxemia. And paradoxial breathing +  Procedure Details Consent: Risks of procedure as well as the alternatives and risks of each were explained to the (patient/caregiver).  Consent for procedure obtained. Time Out: Verified patient identification, verified procedure, site/side was marked, verified correct patient position, special equipment/implants available, medications/allergies/relevent history reviewed, required imaging and test results available.  Performed  Maximum sterile technique was used including cap, gloves, gown, hand hygiene and mask.  MAC  glidescope used Bicarb, fent 100, versed 4, etomidate 47m, roc 562m given pre intubation.  Evaluation Hemodynamic Status: BP stable throughout; O2 sats: stable throughout Patient's Current Condition: stable Complications: No apparent complications Patient did tolerate procedure well. Chest X-ray ordered to verify placement.  CXR: pending.   Wanda James/30/2019

## 2017-08-02 NOTE — Telephone Encounter (Signed)
Wanda James came to [facility] requesting [place] to call [employer] to help get Wanda James's STD reinstated. She had been approved with the previous hospitalization through 7/23.   She was readmitted on 7/20, but her STD stopped on 7/23.   RN called [employer] at [phone] regarding Claim # [id number]. Spoke with a representative who requested MD notes be faxed regarding current hospitalization- records faxed to [phone].  [employer] states patient will also need to call to request to be reinstated- husband notified of phone number and claim number.   [employer] is going to fax a form to Dr Calton Dach office to complete.

## 2017-08-02 NOTE — Progress Notes (Signed)
ANTICOAGULATION CONSULT NOTE - follow up  Pharmacy Consult for IV heparin Indication: pulmonary embolus  Patient Measurements: Height: 5\' 2"  (157.5 cm) Weight: 117 lb 15.1 oz (53.5 kg) IBW/kg (Calculated) : 50.1 Heparin Dosing Weight: 53.5  Medications: Infusions:  . feeding supplement (OSMOLITE 1.2 CAL) Stopped (08/02/17 1021)  . heparin Stopped (08/02/17 1021)    Assessment: 62 yo female admitted with CP with hx renal cell carcinoma now with suspected PE per CCM to start IV heparin per pharmacy dosing.  VQ scan - intermediate probability for PE Dopplers: negative for DVT  Today, 08/02/17  Heparin level therapeutic on current rate of 9 ml/hr  CT was done to evaluate need for thoracentesis, heparin was held but not enough fluid for collection so IV heparin was restarted after a few minutes  No bleeding or complications noted.   Goal of Therapy:  Heparin level 0.3-0.7 units/ml Monitor platelets by anticoagulation protocol: Yes   Plan:  1) Continue current IV heparin rate of 900 units/hr 2) Daily heparin level and CBC   Adrian Saran, PharmD, BCPS Pager [phone] 08/02/2017 2:31 PM

## 2017-08-02 NOTE — Progress Notes (Signed)
Name: Wanda James MRN: [id number] DOB: [birth year]    ADMISSION DATE:  07/17/2017 CONSULTATION DATE:  7/21  REFERRING MD :  Nevada Crane  CHIEF COMPLAINT:  Abnormal CT chest   BRIEF PATIENT DESCRIPTION/HPI    brief This is a 62 year old female patient with a significant history of renal cell carcinoma status post prior left nephrectomy, tobacco abuse, prob COPD with oxygen dependence since recent admit for PNA at end of June. Presented to the emergency room with chief complaint of: Approximately 1 week history of worsening cough initially productive of sputum with black spots, shortness of breath, decreased p.o. appetite with shortness of breath particularly worse over the prior 2 days.    Recently discharged on the hospital in 6/23 2019 where she was admitted for acute acute exacerbation COPD and also pneumonia she was discharged home with prednisone taper, oxygen, and Levaquin. Review of imaging during that hospitalization did show right lower lobe airspace disease, this appeared to improve some on serial films prior to discharge.  She was seen once again by her primary care provider on 7/17 for worsening of symptoms at that time she was started on Biaxin, her symptoms have not improved and therefore she presented to the emergency room.   On presentation this time a CT of chest was obtained this demonstrated: Partial right lower lobe collapse/atelectasis with displacement of the right fissure, mediastinal adenopathy, subtle hypodense masslike abnormality of the liver.  She was admitted with a working diagnosis of recurrent pneumonia with possible lung mass.  Pulmonary has been asked to evaluate in regards to CT findings.    EVENTS CT chest 7/21: Partial right lower lobe collapse/atelectasis with displacement of the right major fissure and caudal displacement of the right hilum.  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 Per family - 2L [place] x 1 month. Then 5LNC few days and overnigth 15LNC. VQ scan intermediate prob technically. 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 No calf swelling. Cannot get IV dye due to allergy. Maintains BP.  Has needed morphine for cancer related pain. Per RN -  Has refused XRT x 2 days due to nause and being "tired and dont know" per    SUBJECTIVE/OVERNIGHT/INTERVAL HX 7/30 - code status reversed to full code. On IV heparin empiric (duplex neg for dvt) based on intermediate prob vvq and worsening hypoxemia. O2 needs now improved to 7L [place]. Husband at bedsidde - feels she is some better. Reports moaning due to abd pain every 3h and getting morphine. Denies any confusion. Goal ispalliative  XRT for mediastinal adenopathya nd RLL collapse while in ICU with full code and if/when ECOG improves.   VITAL SIGNS: Temp:  [97.6 F (36.4 C)-98.8 F (37.1 C)] 98.8 F (37.1 C) (07/30 0745) Pulse Rate:  [104-123] 123 (07/30 0728) Resp:  [19-27] 22 (07/30 0728) BP: (114-151)/(63-94) 114/63 (07/30 0000) SpO2:  [92 %-100 %] 100 % (07/30 0728) Weight:  [53.5 kg (117 lb 15.1 oz)] 53.5 kg (117 lb 15.1 oz) (07/30 0600)  PHYSICAL EXAMINATION:   General Appearance:   frail, deconditioned, without distress, lying in bed, resting  Head:    Normocephalic, without  obvious abnormality, atraumatic  Eyes:    PERRL - yes, conjunctiva/corneas - clear      Ears:    Normal external ear canals, both ears  Nose:   NG tube - no but has 7L [place]  Throat:  ETT TUBE - no , OG tube - no  Neck:   Supple,  No enlargement/tenderness/nodules     Lungs:     Clear to auscultation bilaterally, Mild tachypnea  Chest wall:    No deformity  Heart:    S1 and S2 normal, no murmur, CVP - no.   Pressors - no  Abdomen:     Soft, no masses, no organomegaly  Genitalia:    Not done  Rectal:   not done  Extremities:   Extremities- intact     Skin:   Intact in exposed areas .      Neurologic:   Sedation - morphine prn -> RASS - -3. Oriented when awake without confusion per husband       PULMONARY Recent Labs  Lab 07/29/17 1037 07/31/17 1916  PHART 7.377 7.396  PCO2ART 43.7 43.1  PO2ART 146* 70.1*  HCO3 25.1 25.6  O2SAT 98.4 92.2    CBC Recent Labs  Lab 07/31/17 1906 08/01/17 0145 08/02/17 0313  HGB 11.3* 11.1* 11.1*  HCT 35.7* 35.4* 34.9*  WBC 14.5* 13.7* 14.2*  PLT 266 204 253    COAGULATION No results for input(s): INR in the last 168 hours.  CARDIAC   Recent Labs  Lab 07/29/17 1328 07/31/17 1906 08/01/17 0145 08/01/17 0652  TROPONINI <0.03 0.04* 0.06* 0.06*   No results for input(s): PROBNP in the last 168 hours.   CHEMISTRY Recent Labs  Lab 07/27/17 0504  07/30/17 0434 07/31/17 0349 07/31/17 1906 08/01/17 0145 08/02/17 0313  NA 139   < > 139 142 143 142 143  K 4.7   < > 4.7 4.8 4.3 4.2 4.2  CL 103   < > 102 106 108 109 110  CO2 29   < > 27 25 25 24 28   GLUCOSE 101*   < > 95 88 107* 113* 118*  BUN 23   < > 43* 33* 25* 23 20  CREATININE 0.54   < > 0.68 0.56 0.45 0.50 0.39*  CALCIUM 8.5*   < > 8.6* 8.5* 8.3* 8.2* 8.4*  MG 2.3  --   --   --   --   --   --    < > = values in this interval not displayed.   Estimated Creatinine Clearance: 56.9 mL/min (A) (by C-G formula based on SCr of 0.39 mg/dL (L)).   LIVER Recent Labs  Lab 07/30/17 0434 07/31/17 0349 07/31/17 1906 08/01/17 0145 08/02/17 0313  AST 124* 115* 115* 110* 149*  ALT 99* 86* 79* 73* 84*  ALKPHOS 509* 483* 406* 406* 468*  BILITOT 1.8* 2.2* 2.3* 2.3* 2.2*  PROT 5.5* 5.3* 5.0* 5.0* 5.3*  ALBUMIN 2.4* 2.3* 2.1* 2.0* 2.0*     INFECTIOUS No results for input(s): LATICACIDVEN, PROCALCITON in the last 168 hours.   ENDOCRINE CBG (last 3)  Recent Labs     08/01/17 1948 08/02/17 0009 08/02/17 0312  GLUCAP 154* 131* 104*         IMAGING x48h  - image(s) personally visualized  -   highlighted in bold Dg Abd 1 View  Result Date: 07/31/2017 CLINICAL DATA:  Abdominal pain. EXAM: ABDOMEN - 1 VIEW COMPARISON:  None. FINDINGS: The bowel gas pattern is normal.  No evidence of bowel obstruction or pneumoperitoneum. Surgical clips within the abdomen and surgical sutures within the pelvis noted. No acute bony abnormalities identified. Vascular calcifications are present. IMPRESSION: No acute abnormality.  Unremarkable bowel gas pattern. Electronically Signed   By: Margarette Canada M.D.   On: 07/31/2017 15:15   Nm Pulmonary Perf And Vent  Result Date: 08/01/2017 CLINICAL DATA:  Lung cancer, elevated D-dimer, evaluate for PE EXAM: NUCLEAR MEDICINE VENTILATION - PERFUSION LUNG SCAN TECHNIQUE: Ventilation images were obtained in multiple projections using inhaled aerosol Tc-6m DTPA. Perfusion images were obtained in multiple projections after intravenous injection of Tc-36m-MAA. RADIOPHARMACEUTICALS:  28.3 mCi of Tc-66m DTPA aerosol inhalation and 3.9 mCi Tc80m-MAA IV COMPARISON:  Chest radiograph dated 07/31/2017. CT chest dated 07/14/2017. FINDINGS: Ventilation: Heterogeneous ventilation throughout the left lung and in the right upper lobe. No right lower lobe ventilation is present, corresponding to right lower lobe collapse on prior CT. Perfusion: Normal fusion in the right lung. Diminished/heterogeneous perfusion in the right upper and lower lobes. When correlating with recent chest radiograph, there is a diffuse alveolar process in the right upper lobe and left lung, favoring interstitial edema, less likely multifocal infection. Persistent right lower lobe collapse with a new small right pleural effusion. The radiographic appearance likely accounts for the heterogeneous perfusion. Technically speaking, although pulmonary embolism is not suspected, by definition  this reflects an intermediate probability study. IMPRESSION: Markedly limited evaluation in the setting of known right lower lobe collapse and abnormal chest radiograph which favors interstitial edema or less likely multifocal infection. While pulmonary embolism is not suspected, by definition the study is intermediate probability for pulmonary embolism. In patients with abnormal chest radiograph, CT with contrast is generally preferred for this reason if/when able. Electronically Signed   By: Julian Hy M.D.   On: 08/01/2017 00:08   Dg Chest Port 1 View  Result Date: 08/01/2017 CLINICAL DATA:  Sob; lung ca EXAM: PORTABLE CHEST 1 VIEW COMPARISON:  07/31/2017 FINDINGS: Heart size is normal. Diffuse reticular opacities are identified throughout the lungs bilaterally. More focal opacity is identified in the MEDIAL RIGHT lung base, stable in appearance. There is a RIGHT pleural effusion. RIGHT paratracheal density is consistent with known adenopathy. IMPRESSION: Persistent significant lung opacities, more confluent at the RIGHT lung base. Mediastinal adenopathy. Electronically Signed   By: Nolon Nations M.D.   On: 08/01/2017 13:05   Dg Chest Port 1 View  Result Date: 07/31/2017 CLINICAL DATA:  Hypoxia, cough EXAM: PORTABLE CHEST 1 VIEW COMPARISON:  07/30/2017 FINDINGS: Diffuse interstitial and alveolar opacities throughout the lungs, new since prior study, likely edema. Moderate to large right pleural effusion with right lower lobe atelectasis or infiltrate. Heart is borderline in size. Hyperinflation. No acute bony abnormality. IMPRESSION: Borderline heart size. New diffuse interstitial and alveolar opacities, favor edema. Moderate to large right effusion with right lower lobe atelectasis or infiltrate. Electronically Signed   By: Rolm Baptise M.D.   On: 07/31/2017 19:56   Dg Abd Portable 1v  Result Date: 08/01/2017 CLINICAL DATA:  S/p NG tube placement EXAM: PORTABLE ABDOMEN - 1 VIEW COMPARISON:   None. FINDINGS: Nasogastric tube is in place, tip overlying the level of the proximal to mid stomach. Bowel gas pattern is nonobstructive. Persistent significant lung opacities and RIGHT pleural effusion. IMPRESSION: Interval placement of nasogastric tube, tip overlying the level of the proximal stomach. Electronically Signed   By: Nolon Nations M.D.   On: 08/01/2017 16:21   Acute hypoxemic respiratory failure (HCC) Rapid  deterioration to 15LNC aod 08/01/17  in this patient with new dx of small cell lung cacner. VQ intermediate prob 07/31/17 with trop leak + but RV function and Duplex LE - normal  RLL collapse on CXR 07/31/17  Started empiric hepaerin IV 08/01/16 (duplex le negative x1)  On 08/02/17 - hypoxemia improved to [facility]. Clincially PE (intermdiate prob) + RLL collapse + mediastinal compression as cause for hypoxemia. If effusion  Pleural righ side - small per CT at admit  Plan Get [place] right chest and if effusion is signfiicant - do thora (ordered for IR) in attempt to improve hypoxemia although Continue  empiric IV heparin gtt (dc order for Rx dose lovenox Continue  nebs Contiue o2 Agree with XRT ti help improve airway compression   ECOG is 4. Full code noted    D/w DR Tawanna Solo   Dr. Brand Males, M.D., [facility].C.P Pulmonary and Critical Care Medicine Staff Physician [place] Pager: [phone], If no answer or between  15:00h - 7:00h: call [phone]  08/02/2017 8:58 AM

## 2017-08-02 NOTE — Progress Notes (Signed)
Patient ID: Wanda James, female   DOB: Jul 17, 1955, 62 y.o.   MRN: [id number] Request received for right thoracentesis on patient if adequate amount to tap.  Bedside ultrasound of right posterior chest region today reveals only trace amount of right pleural fluid.  Thoracentesis not performed.  IV heparin restarted.  Nurse/family aware.

## 2017-08-02 NOTE — Progress Notes (Signed)
CRITICAL VALUE ALERT  Critical Value:  Lactic Acid 2.4  Date & Time Notied:  Today, now  Provider Notified: Elink  Orders Received/Actions taken: pending

## 2017-08-02 NOTE — Progress Notes (Signed)
Pt's HR sustained in 130's since start of shift. Elink RN called to question what was done to help decrease HR. This RN said metoprolol could not be given pt's SBP's in the 90's. Elink RN suggested Versed. This RN gave Versed. HR continued in upper 130's. This RN called Elink MD to inform of HR and ask for a possible 1x dose of metoprolol. MD specifically stated to not give anymore beta blockers & that he was comfortable w/ current HR of 139, sinus tachycardia.  Will continue to monitor pt.

## 2017-08-02 NOTE — Progress Notes (Signed)
ANTICOAGULATION CONSULT NOTE - Follow Up Consult  Pharmacy Consult for Heparin Indication: pulmonary embolus  Allergies  Allergen Reactions  . Iohexol Anaphylaxis     Desc: ANAPHALAXIS-ARS ON 08-22-04 Went into a coma  . Sodium Hypochlorite Other (See Comments)    Solution used to place on packing burned very bad, patient states she can't use it any more due to this/thjcma  . Penicillins Hives and Swelling    Has patient had a PCN reaction causing immediate rash, facial/tongue/throat swelling, SOB or lightheadedness with hypotension: No Has patient had a PCN reaction causing severe rash involving mucus membranes or skin necrosis: Yes Has patient had a PCN reaction that required hospitalization: No Has patient had a PCN reaction occurring within the last 10 years: No If all of the above answers are "NO", then may proceed with Cephalosporin use.   Marland Kitchen Hydrocodone Nausea And Vomiting  . Tape Rash    Itchy rash    Patient Measurements: Height: 5\' 2"  (157.5 cm) Weight: 117 lb 15.1 oz (53.5 kg) IBW/kg (Calculated) : 50.1 Heparin Dosing Weight:   Vital Signs: Temp: 98.8 F (37.1 C) (07/30 0400) Temp Source: Axillary (07/30 0400) BP: 114/63 (07/30 0000) Pulse Rate: 123 (07/30 0311)  Labs: Recent Labs    07/31/17 0349 07/31/17 1906 08/01/17 0145 08/01/17 0652 08/01/17 1916 08/02/17 0313  HGB 11.1* 11.3* 11.1*  --   --   --   HCT 35.8* 35.7* 35.4*  --   --   --   PLT 232 266 204  --   --   --   HEPARINUNFRC  --   --   --   --  0.30 0.34  CREATININE 0.56 0.45 0.50  --   --  0.39*  TROPONINI  --  0.04* 0.06* 0.06*  --   --     Estimated Creatinine Clearance: 56.9 mL/min (A) (by C-G formula based on SCr of 0.39 mg/dL (L)).   Medications:  Infusions:  . famotidine (PEPCID) IV Stopped (08/01/17 2230)  . feeding supplement (OSMOLITE 1.2 CAL) 1,000 mL (08/01/17 1707)  . heparin 900 Units/hr (08/01/17 1249)    Assessment: Patient with heparin level at goal.  No heparin  issues noted.  Goal of Therapy:  Heparin level 0.3-0.7 units/ml Monitor platelets by anticoagulation protocol: Yes   Plan:  Continue heparin drip at current rate Recheck level at [address] [place] 08/02/2017,6:11 AM

## 2017-08-02 NOTE — Progress Notes (Signed)
CRITICAL VALUE ALERT  Critical Value:  Lactic Acid 3.1  Date & Time Notied:  Today, @roughly  2020  Provider Notified: Elink on call  Orders Received/Actions taken: pending

## 2017-08-02 NOTE — Progress Notes (Signed)
[facility] Progress Note Patient Name: Wanda James DOB: 05/25/1955 MRN: [id number]   Date of Service  08/02/2017  HPI/Events of Note  Nurse asking about heparin infusion  eICU Interventions  Recommend Primary team to address in light of patient with probable metastatic disease with liver massess        Briton Sellman 08/02/2017, 8:07 PM

## 2017-08-02 NOTE — Progress Notes (Signed)
SLP Cancellation Note  Patient Details Name: ANNIS LAGOY MRN: [id number] DOB: Dec 08, 1955   Cancelled treatment:       Reason Eval/Treat Not Completed: Other (comment)(per chart review, pt at radiation tx, note she has a small bore feeding tube for nutrition at this time, will continue efforts to see if clinical improvement occurs to allow further swallow evaluation, po trials,  thanks)   Macario Golds 08/02/2017, 2:17 PM Luanna Salk, [facility] SLP [phone]

## 2017-08-02 NOTE — Progress Notes (Signed)
Daily Progress Note   Patient Name: Wanda James       Date: 08/02/2017 DOB: Jul 10, 1955  Age: 62 y.o. MRN#: [id number] Attending Physician: Shelly Coss, MD Primary Care Physician: Gaye Alken, PA-C Admit Date: 07/16/2017  Reason for Consultation/Follow-up: Establishing goals of care  Subjective: Patient sleeping.  Arouses briefly, but falls back asleep.  I met at bedside with her husband.  We reviewed her clinical course over the past few days as well as options moving forward.  He reports discussing with Dr. Alvy Bimler as well as PCCM and being comfortable with plan moving forward to eval for thoracentesis, continue radiation, and hopeful that she will be able to receive chemotherapy in the future.   Length of Stay: 10  Current Medications: Scheduled Meds:  . budesonide (PULMICORT) nebulizer solution  0.25 mg Nebulization BID  . dexamethasone  4 mg Intravenous Q24H  . diclofenac sodium  4 g Topical TID  . escitalopram  5 mg Oral Daily  . free water  100 mL Per Tube Q4H  . ipratropium-albuterol  3 mL Nebulization Q4H  . lactulose  20 g Per Tube BID  . lactulose  300 mL Rectal Once  . lipase/protease/amylase  12,000 Units Oral TID AC  . mouth rinse  15 mL Mouth Rinse BID  . metoCLOPramide  10 mg Per Tube TID AC  . multivitamin  1 tablet Oral Daily  . polyethylene glycol  17 g Oral Daily  . ranitidine  150 mg Per Tube BID    Continuous Infusions: . feeding supplement (OSMOLITE 1.2 CAL) 1,000 mL (08/01/17 1707)  . heparin 900 Units/hr (08/01/17 1249)    PRN Meds: levalbuterol, LORazepam, morphine injection, naphazoline-glycerin, ondansetron (ZOFRAN) IV, phenol  Physical Exam         Sleeping and awakens only briefly but falls back asleep and does not participate in  conversation. In no distress No edema  Vital Signs: BP 114/63   Pulse (!) 123   Temp 98.8 F (37.1 C) (Axillary)   Resp (!) 22   Ht _0  (1.575 m)   Wt 53.5 kg (117 lb 15.1 oz)   SpO2 100%   BMI 21.57 kg/m  SpO2: SpO2: 100 % O2 Device: O2 Device: High Flow Nasal Cannula O2 Flow Rate: O2 Flow Rate (L/min): 10 L/min  Intake/output summary:  Intake/Output Summary (Last 24 hours) at 08/02/2017 0930 Last data filed at 08/02/2017 2025 Gross per 24 hour  Intake 1454.73 ml  Output 925 ml  Net 529.73 ml   LBM: Last BM Date: 07/30/17 Baseline Weight: Weight: 51.3 kg (113 lb) Most recent weight: Weight: 53.5 kg (117 lb 15.1 oz)       Palliative Assessment/Data:    Flowsheet Rows     Most Recent Value  Intake Tab  Referral Department  Hospitalist  Unit at Time of Referral  Med/Surg Unit  Date Notified  07/24/17  Palliative Care Type  Not seen  Reason Not Seen  Consult cancelled  Reason for referral  Clarify Goals of Care  Date of Admission  07/13/2017  # of days IP prior to Palliative referral  1  Clinical Assessment  Psychosocial & Spiritual Assessment  Palliative Care Outcomes      Patient Active Problem List   Diagnosis Date Noted  . Elevated troponin 08/01/2017  . Hypoxia   . Generalized anxiety disorder   . Abdominal pain   . Nausea & vomiting   . Lung cancer ([place]) 07/27/2017  . Cancer associated pain   . Liver metastases ([place])   . Other constipation   . HCAP (healthcare-associated pneumonia) 07/12/2017  . CAP (community acquired pneumonia) 06/28/2017  . New onset type 2 diabetes mellitus ([place]) 06/28/2017  . Leukocytosis 06/28/2017  . COPD exacerbation ([place]) 06/26/2017  . Acute hypoxemic respiratory failure ([place]) 06/26/2017  . GERD (gastroesophageal reflux disease) 06/26/2017  . Tobacco abuse 06/26/2017  . COPD UNSPECIFIED 04/29/2009  . DYSLIPIDEMIA 03/10/2009  . 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 Chest pain 02/04/2009  . TOBACCO ABUSE  01/23/2009  . DEPRESSION 01/23/2009    Palliative Care Assessment & Plan   Patient Profile:    Assessment: acute hypoxic resp failure Nausea, dysphagia R hilar mass Liver metastasis Post obstructive PNA Severe protein calorie malnutrition  Anxiety  History of smoking   Recommendations/Plan:  Family invested in plan for continued aggressive therapies including plan for radiation followed by evaluation for potential chemotherapy.  Discussed with husband at bedside today with main intent of building rapport and determining his understanding of her situation.  He remains invested in plan for continued aggressive interventions, including evaluation for thoracentesis today.  I left my card and will continue to check in intermittently to support family and progress conversation as family is emotionally able to do so.   Code Status:    Code Status Orders  (From admission, onward)        Start     Ordered   07/31/17 2150  Limited resuscitation (code)  Continuous    Question Answer Comment  In the event of cardiac or respiratory ARREST: Initiate Code Blue, Call Rapid Response Yes   In the event of cardiac or respiratory ARREST: Perform CPR No   In the event of cardiac or respiratory ARREST: Perform Intubation/Mechanical Ventilation Yes   In the event of cardiac or respiratory ARREST: Use NIPPV/BiPAp only if indicated Yes   In the event of cardiac or respiratory ARREST: Administer ACLS medications if indicated Yes   In the event of cardiac or respiratory ARREST: Perform Defibrillation or Cardioversion if indicated Yes      07/31/17 2150    Code Status History    Date Active Date Inactive Code Status Order ID Comments User Context   07/31/2017 2142 07/31/2017 2150 Partial Code [id number]  Neila Gear, NP Inpatient   07/16/2017 2322 07/31/2017 2142  Full Code [id number]  Jani Gravel, MD ED   06/26/2017 1753 07/01/2017 1630 Full Code [id number]  Dessa Phi, DO Inpatient        Prognosis:   Unable to determine  Discharge Planning:  To Be Determined  Care plan was discussed with Husband  Thank you for allowing the Palliative Medicine Team to assist in the care of this patient.   Time In: 0900 Time Out: 0930 Total Time 30 Prolonged Time Billed  no       Greater than 50%  of this time was spent counseling and coordinating care related to the above assessment and plan.  Micheline Rough, MD [place] Team [phone]  Please contact Palliative Medicine Team phone at [phone] for questions and concerns.

## 2017-08-02 NOTE — Progress Notes (Signed)
[facility] Progress Note Patient Name: Wanda James DOB: 1955/12/26 MRN: [id number]   Date of Service  08/02/2017  HPI/Events of Note  Hr in 130's Sinus Tachycardia  Nurse asking for extra dose of lopressor  eICU Interventions  Plan is NOT  to give any extra dose of beta blocker, patient sedated, no distress/pain noted     Intervention Category Minor Interventions: Other:  Flora Lipps 08/02/2017, 11:15 PM

## 2017-08-03 ENCOUNTER — Ambulatory Visit: Payer: BLUE CROSS/BLUE SHIELD

## 2017-08-03 ENCOUNTER — Ambulatory Visit
Admit: 2017-08-03 | Discharge: 2017-08-03 | Disposition: A | Discharge status: Home / Self Care | Payer: BLUE CROSS/BLUE SHIELD | Attending: Radiation Oncology | Admitting: Radiation Oncology

## 2017-08-03 ENCOUNTER — Inpatient Hospital Stay (HOSPITAL_COMMUNITY): Payer: BLUE CROSS/BLUE SHIELD

## 2017-08-03 DIAGNOSIS — J9620 Acute and chronic respiratory failure, unspecified whether with hypoxia or hypercapnia: Secondary | ICD-10-CM

## 2017-08-03 LAB — LACTIC ACID, PLASMA
Lactic Acid, Venous: 3.7 mmol/L (ref 0.5–1.9)
Lactic Acid, Venous: 3.9 mmol/L (ref 0.5–1.9)

## 2017-08-03 LAB — GLUCOSE, CAPILLARY
GLUCOSE-CAPILLARY: 112 mg/dL — AB (ref 70–99)
GLUCOSE-CAPILLARY: 114 mg/dL — AB (ref 70–99)
GLUCOSE-CAPILLARY: 131 mg/dL — AB (ref 70–99)
GLUCOSE-CAPILLARY: 132 mg/dL — AB (ref 70–99)
Glucose-Capillary: 122 mg/dL — ABNORMAL HIGH (ref 70–99)
Glucose-Capillary: 138 mg/dL — ABNORMAL HIGH (ref 70–99)
Glucose-Capillary: 148 mg/dL — ABNORMAL HIGH (ref 70–99)

## 2017-08-03 LAB — CBC
HCT: 35.3 % — ABNORMAL LOW (ref 36.0–46.0)
Hemoglobin: 11.2 g/dL — ABNORMAL LOW (ref 12.0–15.0)
MCH: 29.2 pg (ref 26.0–34.0)
MCHC: 31.7 g/dL (ref 30.0–36.0)
MCV: 92.2 fL (ref 78.0–100.0)
PLATELETS: 231 10*3/uL (ref 150–400)
RBC: 3.83 MIL/uL — ABNORMAL LOW (ref 3.87–5.11)
RDW: 17.6 % — ABNORMAL HIGH (ref 11.5–15.5)
WBC: 16.4 10*3/uL — ABNORMAL HIGH (ref 4.0–10.5)

## 2017-08-03 LAB — COMPREHENSIVE METABOLIC PANEL
ALT: 159 U/L — ABNORMAL HIGH (ref 0–44)
ANION GAP: 9 (ref 5–15)
AST: 396 U/L — ABNORMAL HIGH (ref 15–41)
Albumin: 2 g/dL — ABNORMAL LOW (ref 3.5–5.0)
Alkaline Phosphatase: 538 U/L — ABNORMAL HIGH (ref 38–126)
BUN: 29 mg/dL — ABNORMAL HIGH (ref 8–23)
CHLORIDE: 107 mmol/L (ref 98–111)
CO2: 31 mmol/L (ref 22–32)
Calcium: 8.2 mg/dL — ABNORMAL LOW (ref 8.9–10.3)
Creatinine, Ser: 0.63 mg/dL (ref 0.44–1.00)
Glucose, Bld: 137 mg/dL — ABNORMAL HIGH (ref 70–99)
POTASSIUM: 4.6 mmol/L (ref 3.5–5.1)
Sodium: 147 mmol/L — ABNORMAL HIGH (ref 135–145)
Total Bilirubin: 3.6 mg/dL — ABNORMAL HIGH (ref 0.3–1.2)
Total Protein: 5.4 g/dL — ABNORMAL LOW (ref 6.5–8.1)

## 2017-08-03 LAB — MAGNESIUM: MAGNESIUM: 2.1 mg/dL (ref 1.7–2.4)

## 2017-08-03 LAB — TROPONIN I: TROPONIN I: 0.04 ng/mL — AB (ref <0.03)

## 2017-08-03 LAB — PROCALCITONIN: PROCALCITONIN: 6.32 ng/mL

## 2017-08-03 LAB — PHOSPHORUS

## 2017-08-03 LAB — AMMONIA: AMMONIA: 87 umol/L — AB (ref 9–35)

## 2017-08-03 MED ORDER — POTASSIUM PHOSPHATES 15 MMOLE/5ML IV SOLN
30.0000 mmol | Freq: Once | INTRAVENOUS | Status: AC
  Administered 2017-08-03: 30 mmol via INTRAVENOUS
  Filled 2017-08-03: qty 10

## 2017-08-03 MED ORDER — VANCOMYCIN HCL IN DEXTROSE 750-5 MG/150ML-% IV SOLN
750.0000 mg | INTRAVENOUS | Status: DC
  Administered 2017-08-04: 750 mg via INTRAVENOUS
  Filled 2017-08-03 (×2): qty 150

## 2017-08-03 MED ORDER — VANCOMYCIN HCL IN DEXTROSE 1-5 GM/200ML-% IV SOLN
1000.0000 mg | Freq: Once | INTRAVENOUS | Status: AC
  Administered 2017-08-03: 1000 mg via INTRAVENOUS
  Filled 2017-08-03: qty 200

## 2017-08-03 MED ORDER — ALBUMIN HUMAN 25 % IV SOLN
25.0000 g | Freq: Once | INTRAVENOUS | Status: AC
  Administered 2017-08-03 – 2017-08-04 (×2): 12.5 g via INTRAVENOUS
  Filled 2017-08-03: qty 50

## 2017-08-03 MED ORDER — OSMOLITE 1.2 CAL PO LIQD
1000.0000 mL | ORAL | Status: DC
  Administered 2017-08-03 – 2017-08-04 (×3): 1000 mL

## 2017-08-03 MED ORDER — ACETAMINOPHEN 325 MG PO TABS
650.0000 mg | ORAL_TABLET | ORAL | Status: DC | PRN
  Administered 2017-08-03: 650 mg via ORAL
  Filled 2017-08-03: qty 2

## 2017-08-03 MED ORDER — SODIUM CHLORIDE 0.9 % IV SOLN
2.0000 g | Freq: Three times a day (TID) | INTRAVENOUS | Status: DC
  Administered 2017-08-03 – 2017-08-05 (×6): 2 g via INTRAVENOUS
  Filled 2017-08-03 (×7): qty 2

## 2017-08-03 MED ORDER — LACTATED RINGERS IV BOLUS
1000.0000 mL | Freq: Once | INTRAVENOUS | Status: AC
  Administered 2017-08-03: 1000 mL via INTRAVENOUS

## 2017-08-03 MED ORDER — ENOXAPARIN SODIUM 40 MG/0.4ML ~~LOC~~ SOLN
40.0000 mg | Freq: Every day | SUBCUTANEOUS | Status: DC
  Administered 2017-08-03 – 2017-08-04 (×2): 40 mg via SUBCUTANEOUS
  Filled 2017-08-03 (×2): qty 0.4

## 2017-08-03 NOTE — Progress Notes (Signed)
Pharmacy Antibiotic Note  Wanda James is a 62 y.o. female admitted on 08/02/2017 with chest pain and SOB, found to have lung mass with liver mets, and postobstructive pneumonia treated with Cefepime. Now with new fever and rising lactate, suspect sepsis or recurrent HCAP.  Pharmacy has been consulted for Vancomycin and Cefepime dosing. - Noted PCN allergy.  She previously completed 8 days of Cefepime. - Re-intubated on 7/30 - Tm 102.8 overnight.  WBC up to 16.4 (Decadron).  SCr low/stable.  LA up to 3.7  Plan: Cefepime 2g IV q8h Vancomycin 1g IV x1 then 750 IV q24h. Check vancomycin levels if remains on vancomycin > 3-4 days.  Goal AUC 400-500. Follow up renal fxn, culture results, and clinical course. F/u ability to de-escalate antibiotics.   Height: 5\' 2"  (157.5 cm) Weight: 124 lb 1.9 oz (56.3 kg) IBW/kg (Calculated) : 50.1  Temp (24hrs), Avg:99.7 F (37.6 C), Min:98.3 F (36.8 C), Max:102.8 F (39.3 C)  Recent Labs  Lab 07/31/17 0349 07/31/17 1906 08/01/17 0145 08/02/17 0313 08/02/17 1849 08/02/17 1852 08/02/17 2130 08/03/17 0617  WBC 14.2* 14.5* 13.7* 14.2*  --   --   --  16.4*  CREATININE 0.56 0.45 0.50 0.39*  --  0.38*  --  0.63  LATICACIDVEN  --   --   --   --  3.1*  --  2.4* 3.7*    Estimated Creatinine Clearance: 56.9 mL/min (by C-G formula based on SCr of 0.63 mg/dL).    Allergies  Allergen Reactions  . Iohexol Anaphylaxis     Desc: ANAPHALAXIS-ARS ON 08-22-04 Went into a coma  . Sodium Hypochlorite Other (See Comments)    Solution used to place on packing burned very bad, patient states she can't use it any more due to this/thjcma  . Penicillins Hives and Swelling    Has patient had a PCN reaction causing immediate rash, facial/tongue/throat swelling, SOB or lightheadedness with hypotension: No Has patient had a PCN reaction causing severe rash involving mucus membranes or skin necrosis: Yes Has patient had a PCN reaction that required hospitalization:  No Has patient had a PCN reaction occurring within the last 10 years: No If all of the above answers are "NO", then may proceed with Cephalosporin use.   Marland Kitchen Hydrocodone Nausea And Vomiting  . Tape Rash    Itchy rash    Antimicrobials this admission: 7/20 vanc >> 7/22, resume 7/31 >> 7/20 cefepime >> 7/28, resume 7/31 >>  Dose adjustments this admission:   Microbiology results: 7/20BCx x1: NGF 7/21 Strep pneumo: neg 721 Legionella: neg 7/22 MRSA PCR: negative 7/26 GI panel: Enteropathogenic E coli  DETECTED  7/26 CDiff: canceled  Thank you for allowing pharmacy to be a part of this patient's care.  Gretta Arab PharmD, BCPS Pager [phone] 08/03/2017 9:55 AM

## 2017-08-03 NOTE — Progress Notes (Signed)
Temp spiked to 102.8 oral per NT. Called Elink for PRN orders. Implementing nursing measures to cool pt off till further orders received.

## 2017-08-03 NOTE — Progress Notes (Signed)
[facility] Progress Note Patient Name: Wanda James DOB: 09-11-1955 MRN: [id number]   Date of Service  08/03/2017  HPI/Events of Note  Heart monitor and rhythm reviewed, HR 152 Sinus Tachycardia, noted, there are discernable P waves with  Rhythm, nurse called again  eICU Interventions  Will NOT treat with extra dose of beta blocker Will continue to see if rhythm chnages     Intervention Category Major Interventions: Arrhythmia - evaluation and management  Jeshawn Melucci 08/03/2017, 12:32 AM

## 2017-08-03 NOTE — Progress Notes (Signed)
Pt transported to [facility] on LTV vent with no complications.

## 2017-08-03 NOTE — Progress Notes (Signed)
Patient returned from radiology on potable vent inown bed; attached to room monitor. No signs of distress noted. Family at bedside; updated to trip to radiology.

## 2017-08-03 NOTE — Progress Notes (Signed)
Nutrition Follow-up  DOCUMENTATION CODES:   Non-severe (moderate) malnutrition in context of chronic illness  INTERVENTION:  - Will hold Omsolite 1.2 @ 45 mL/hr at this time given current serum Phos although do not feel this is re-feeding related as K and Mg are WDL. - Free water flush to be per PCCM.    NUTRITION DIAGNOSIS:   Moderate Malnutrition related to chronic illness(COPD/recurrent PNA) as evidenced by energy intake < 75% for > or equal to 1 month, percent weight loss. -ongoing  GOAL:   Patient will meet greater than or equal to 90% of their needs -unmet with current TF rate.  MONITOR:   Vent status, TF tolerance, Weight trends, Labs, I & O's  REASON FOR ASSESSMENT:   Ventilator  ASSESSMENT:   Patient with PMH significant for renal cell carcinoma, tobacco abuse, COPD, and stroke. Presents this admission with complaints of chest pain. Admitted for right lower lobe lung collapse with possible lung mass and possible mass in the liver.   Weight continues to trend up and is +7 lbs/2.8 kg from yesterday to today and is +13 lbs/5.6 kg compared to admission weight. Patient required intubation around 7:00 PM yesterday. NGT removed and OGT placed. X-ray of abd/pelvis ordered today. Estimated nutrition needs adjusted based on intubation. Patient is currently receiving Osmolite 1.2 @ 45 mL/hr with 100 mL free water every 4 hours. This regimen is providing 1296 kcal, 60 grams of protein, and 1486 mL free water. Order in place for 1 bag LR to be given today. Daughter was at bedside and reports that patient is allergic to pineapple; no pineapple or pineapple-based constituents in Osmolite 1.2.   Per Dr. 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   Patient is currently intubated on ventilator support MV: 8.3 L/min Temp (24hrs), Avg:99.7 F (37.6 C), Min:98.3 F (36.8 C), Max:102.8 F (39.3 C) Propofol: none BP: 133/85 and MAP: 96   Medications reviewed; sliding scale Novolog, 20 mg lactulose per OGT BID, 12878 units Creon per OGT TID, 10 mg Reglan per OGT TID, 1 tablet Prosight/day, 1 packet Miralax/day, 30 mmol IV KPhos x1 run today, 50 mEq IV sodium bicarb x1 yesterday.  Labs reviewed; CBGs: 122, 138, and 148 mg/dL today, Na: 147 mmol/L, BUN: 29 mg/dL, Ca: 8.2 mg/dL, Alk Phos and LFTs elevated and trending up, Phos: <1 mg/dL; K and Mg WDL, ammonia: 87 umol/L.  Drip: Fentanyl @ 50 mcg/hr.       Diet Order:   Diet Order           Diet NPO time specified  Diet effective now          EDUCATION NEEDS:   Education needs have been addressed  Skin:  Skin Assessment: Skin Integrity Issues: Skin Integrity Issues:: Incisions Incisions: R abdomen (7/23)  Last BM:  7/30  Height:   Ht Readings from Last 1 Encounters:  08/02/17 5' 2" (1.575 m)    Weight:   Wt Readings from Last 1 Encounters:  08/03/17 124 lb 1.9 oz (56.3 kg)    Ideal Body Weight:  50 kg  BMI:  Body mass index is 22.7 kg/m.  Estimated Nutritional Needs:   Kcal:  1587  Protein:  76-86 grams (1.5-1.7 grams/kg)  Fluid:  >1.4 L/day  Wanda Matin, MS, RD, LDN, [facility] Inpatient Clinical Dietitian Pager # [phone] After hours/weekend pager # [phone]

## 2017-08-03 NOTE — Progress Notes (Signed)
Called elink MD to verify to restart heparin gtt post-intubation. Was told to not restart heparin gtt. AM heparin levels will not be drawn at 0500 despite active orders. Will inform pharmacist.

## 2017-08-03 NOTE — Progress Notes (Signed)
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  I would defer to radiation oncologist Systemic chemotherapy would be challenging, in the presence of infection, unstable vital signs and progressive liver failure I have discussed this with family members today They appears still optimistic that she will continue to improve with aggressive supportive measure  Elevated liver enzymesand mental status change Due to diffuse liver involvement, with signs of progressive liver failure Ammonia level is elevated Continue supportive care This is likely going to preclude multiple choices of chemotherapy  Chronic respiratory failure due to COPD, oxygen dependent Probable post-obstructive pneumonia Continue inhalers and oxygen along with antibiotics She is receiving aggressive oxygen support and nebulizers  Moderate to severe protein calorie malnutrition, severe electrolyte imbalance Dysphagia, high risk of aspiration Due to cancer Advancing diet through NG tube, on scheduled Reglan She is on electrolyte replacement  Pathogenic E. Coli She is currently on broad-spectrum IV antibiotics  Tachycardia, possible PE She is on IV heparin  CODE STATUS After extensive discussion with the husband, he is in agreement to change her CODE STATUS to full code based on prior discussion  Goals of care discussion Due to her altered mental status, her husband is the dedicated healthcare power  attorney  Discharge planning Unlikely over the next few weeks  Heath Lark, MD 08/03/2017  10:41 AM   Subjective:  I met with the family.  The patient is intubated.  She is currently on isolation due to pathology in the E. coli noted from stool studies. Nutritional feeding at 45 cc/h She is receiving electrolyte replacement therapy  Objective:  Vitals:   08/03/17 0600 08/03/17 0747  BP: 121/78   Pulse: (!) 136   Resp: 17   Temp: 99.7 F (37.6 C) 98.8 F (37.1 C)  SpO2: 99% 94%     Intake/Output Summary (Last 24 hours) at 08/03/2017 1041 Last data filed at 08/03/2017 0943 Gross per 24 hour  Intake 1601.37 ml  Output 340 ml  Net 1261.37 ml    GENERAL:intubated, sedated NEURO: sedated   Labs:  Lab Results  Component Value Date   WBC 16.4 (H) 08/03/2017   HGB 11.2 (L) 08/03/2017   HCT 35.3 (L) 08/03/2017   MCV 92.2 08/03/2017   PLT 231 08/03/2017   NEUTROABS 7.0 07/27/2017    Lab Results  Component Value Date   NA 147 (H) 08/03/2017   K 4.6 08/03/2017   CL 107 08/03/2017   CO2 31 08/03/2017    Studies:  Dg Chest 1 View  Result Date: 08/02/2017 CLINICAL DATA:  New onset of shortness of breath, history COPD, stroke, metastatic lung cancer, type II diabetes mellitus, smoker EXAM: CHEST  1 VIEW COMPARISON:  Portable exam 1716 hours compared to 08/01/2017 FINDINGS: Tip of nasogastric tube projects over proximal to mid stomach. Normal heart size, mediastinal contours, and pulmonary vascularity. Diffuse BILATERAL pulmonary infiltrates again identified, question pulmonary edema versus infection, significantly increased since 06/28/2017. Moderate RIGHT pleural effusion and mild RIGHT basilar atelectasis, appears slightly increased. No pneumothorax or acute osseous findings. IMPRESSION: Increased RIGHT pleural effusion  and basilar atelectasis. Persistent diffuse interstitial infiltrates, question pulmonary edema versus atypical infection. Electronically Signed   By: Lavonia Dana M.D.   On: 08/02/2017 17:52   [place] Chest (pleural Effusion)  Result Date: 08/02/2017 CLINICAL DATA:  Evaluation of right pleural effusion prior to possible thoracentesis. EXAM: CHEST ULTRASOUND COMPARISON:  Chest x-ray on 08/01/2017 FINDINGS: Bedside ultrasound demonstrates a small volume right pleural effusion with underlying atelectatic/consolidated right lower lung. Pleural fluid volume is too small to warrant thoracentesis. The liver is partially visualized and contains numerous hypoechoic lesions. IMPRESSION: 1. Small volume right pleural effusion which is too small to warrant thoracentesis currently. 2. Multiple liver lesions. Electronically Signed   By: Aletta Edouard M.D.   On: 08/02/2017 11:08   Dg Chest Port 1 View  Result Date: 08/03/2017 CLINICAL DATA:  Endotracheal tube. EXAM: PORTABLE CHEST 1 VIEW COMPARISON:  Radiograph August 02, 2017. FINDINGS: Stable cardiomediastinal silhouette. Endotracheal and nasogastric tubes are unchanged in position. No pneumothorax is noted. Interval placement of right-sided PICC line with distal tip in expected position of the SVC. Diffuse interstitial densities are noted throughout both lungs consistent with pulmonary edema or possibly inflammation. Moderate right pleural effusion is noted. Old right rib fracture is noted. IMPRESSION: Endotracheal and nasogastric tubes are unchanged in position. Interval placement of right-sided PICC line. Stable bilateral lung opacities are noted concerning for edema or possibly inflammation. Moderate right pleural effusion is noted. Electronically Signed   By: Marijo Conception, M.D.   On: 08/03/2017 07:11   Dg Chest Port 1 View  Result Date: 08/02/2017 CLINICAL DATA:  Endotracheal tube placement EXAM: PORTABLE CHEST 1 VIEW COMPARISON:  08/02/2017 FINDINGS: Endotracheal tube 2 cm above the carina.  NG tube in the stomach Severe diffuse bilateral airspace disease unchanged. Moderate right effusion unchanged. Right lower lobe  atelectasis unchanged. IMPRESSION: Endotracheal tube in satisfactory position. No change in extensive diffuse bilateral airspace disease and right pleural effusion. Electronically Signed   By: Franchot Gallo M.D.   On: 08/02/2017 19:43   Dg Chest Port 1 View  Result Date: 08/01/2017 CLINICAL DATA:  Sob; lung ca EXAM: PORTABLE CHEST 1 VIEW COMPARISON:  07/31/2017 FINDINGS: Heart size is normal. Diffuse reticular opacities are identified throughout the lungs bilaterally. More focal opacity is identified in the MEDIAL RIGHT lung base, stable in appearance. There is a RIGHT pleural effusion. RIGHT paratracheal density is consistent with known adenopathy. IMPRESSION: Persistent significant lung opacities, more confluent at the RIGHT lung base. Mediastinal adenopathy. Electronically Signed   By: Nolon Nations M.D.   On: 08/01/2017 13:05   Dg Abd Portable 1v  Result Date: 08/01/2017 CLINICAL DATA:  S/p NG tube placement EXAM: PORTABLE ABDOMEN - 1 VIEW COMPARISON:  None. FINDINGS: Nasogastric tube is in place, tip overlying the level of the proximal to mid stomach. Bowel gas pattern is nonobstructive. Persistent significant lung opacities and RIGHT pleural effusion. IMPRESSION: Interval placement of nasogastric tube, tip overlying the level of the proximal stomach. Electronically Signed   By: Nolon Nations M.D.   On: 08/01/2017 16:21   [place] Ekg Site Rite  Result Date: 08/02/2017 If Site Rite image not attached, placement could not be confirmed due to current cardiac rhythm.

## 2017-08-03 NOTE — Progress Notes (Signed)
[place] Progress Note Patient Name: Wanda James DOB: 04-01-1955 MRN: [id number]   Date of Service  08/03/2017  HPI/Events of Note  Slight increase in Lactic acid level from 3.7 to 3.9. A significant component of Lactemia likely related to liver failure but a hemodynamic component is possible. Given low serum albumin will treat with Albumin fluid bolus.  eICU Interventions  25 % Albumin 25 gm iv fluid bolus x 1 then repeat Lactic acid level in AM        Okoronkwo U Ogan 08/03/2017, 11:37 PM

## 2017-08-03 NOTE — Progress Notes (Signed)
Name: Wanda James MRN: [id number] DOB: November 03, 1955    ADMISSION DATE:  07/22/2017 CONSULTATION DATE:  7/21  REFERRING MD :  N[place]ada Crane  CHIEF COMPLAINT:  Abnormal CT chest   BRIEF PATIENT DESCRIPTION/HPI    brief This is a 62 year old female patient with a significant history of renal cell carcinoma status post prior left nephrectomy, tobacco abuse, prob COPD with oxygen dependence since recent admit for PNA at end of June. Presented to the emergency room with chief complaint of: Approximately 1 week history of worsening cough initially productive of sputum with black spots, shortness of breath, decreased p.o. appetite with shortness of breath particularly worse over the prior 2 days.    Recently discharged on the hospital in 6/23 2019 where she was admitted for acute acute exacerbation COPD and also pneumonia she was discharged home with prednisone taper, oxygen, and Levaquin. Review of imaging during that hospitalization did show right lower lobe airspace disease, this appeared to improve some on serial films prior to discharge.  She was seen once again by her primary care provider on 7/17 for worsening of symptoms at that time she was started on Biaxin, her symptoms have not improved and therefore she presented to the emergency room.   On presentation this time a CT of chest was obtained this demonstrated: Partial right lower lobe collapse/atelectasis with displacement of the right fissure, mediastinal adenopathy, subtle hypodense masslike abnormality of the liver.  She was admitted with a working diagnosis of recurrent pneumonia with possible lung mass.  Pulmonary has been asked to evaluate in regards to CT findings.    has a past medical history of Acid reflux, Arthritis, Cancer ([place]), Cataract, Chiari malformation type I ([place]), Complication of anesthesia, COPD (chronic obstructive pulmonary disease) ([place]), Diverticulitis, Headache, Pneumonia, Stroke ([place]), and Subclavian artery stenosis,  left ([place]).   has a past surgical history that includes carotidendarterectomy; Nephrectomy; Abdominal hysterectomy; Inner ear surgery; Eye surgery; Tonsillectomy; Colon surgery; Partial colectomy; Tubal ligation; Breast surgery; Rhinoplasty; and Lumbar laminectomy/decompression microdiscectomy (N/A, 08/19/2016).   EVENTS CT chest 7/21: Partial right lower lobe collapse/atelectasis with displacement of the right major fissure and caudal displacement of the right hilum.  There is also mediastinal lymphadenopathy and a subtle hypodensity masslike area in the liver   7/21 - pccm consult  7/22 - liver biopsy recommended and ccm signed off  7/23 - s/p liver biopsy  (MRI [place] negative for mets) - small lung - suggestive of primary  lung cacner  7/28 - intermediate prob for VQ   7/29 - pccm recalled. Per family - 2L [place] x 1 month. Then 5LNC few days and overnigth 15LNC. VQ scan intermediate prob technically. 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 No calf swelling. Cannot get IV dye due to allergy. Maintains BP.  Has needed morphine for cancer related pain. Per RN -  Has refused XRT x 2 days due to nause and being "tired and dont know" per   7/30 - code status reversed to full code. On IV heparin empiric (duplex neg for dvt) based on intermediate prob vvq and worsening hypoxemia. O2 needs now improved to 7L [place]. Husband at bedsidde - feels she is some better. Reports moaning due to abd pain every 3h and getting morphine. Denies any confusion. Goal ispalliative  XRT for mediastinal adenopathya nd RLL collapse while in ICU with full code and if/when ECOG improves.     SUBJECTIVE/OVERNIGHT/INTERVAL HX 7/31 - intubated last night due to worsening hypoxemia and  distress despite IV heparin (DVT negative and ECHO no RV strian). This am - ssedated on fent gtt. Phos being repleted. Maintains bp/hr/ur op. XRT pending later today.  Family at bedside.  T max 103 with rising wbc and lactic > 3  VITAL  SIGNS: Temp:  [98.3 F (36.8 C)-102.8 F (39.3 C)] 99.7 F (37.6 C) (07/31 0600) Pulse Rate:  [120-152] 136 (07/31 0600) Resp:  [16-34] 17 (07/31 0600) BP: (99-138)/(62-83) 121/78 (07/31 0600) SpO2:  [88 %-100 %] 94 % (07/31 0747) FiO2 (%):  [40 %-100 %] 40 % (07/31 0747) Weight:  [56.3 kg (124 lb 1.9 oz)] 56.3 kg (124 lb 1.9 oz) (07/31 0500)  PHYSICAL EXAMINATION:   General Appearance:    Looks criticall ill. Frail on vent  Head:    Normocephalic, without obvious abnormality, atraumatic  Eyes:    PERRL - yes, conjunctiva/corneas - clear      Ears:    Normal external ear canals, both ears  Nose:   NG tube - no but has og  Throat:  ETT TUBE - yes , OG tube - yes with TF  Neck:   Supple,  No enlargement/tenderness/nodules     Lungs:     Clear to auscultation bilaterally, Ventilator   Synchrony - yes  Chest wall:    No deformity  Heart:    S1 and S2 normal, no murmur, CVP - no.  Pressors - no  Abdomen:     Soft, no masses, no organomegaly  Genitalia:    Not done  Rectal:   not done  Extremities:   Extremities- intact      Skin:   Intact in exposed areas .      Neurologic:   Sedation - fent 75cmg gtt -> RASS - -3 . Moves all 4s - yes during wake up. CAM-ICU - na . Orientation - na          PULMONARY Recent Labs  Lab 07/29/17 1037 07/31/17 1916 08/02/17 1819 08/02/17 2015  PHART 7.377 7.396 7.452* 7.388  PCO2ART 43.7 43.1 38.7 48.4*  PO2ART 146* 70.1* 45.4* 113*  HCO3 25.1 25.6 26.6 28.5*  O2SAT 98.4 92.2 82.2 95.8    CBC Recent Labs  Lab 08/01/17 0145 08/02/17 0313 08/03/17 0617  HGB 11.1* 11.1* 11.2*  HCT 35.4* 34.9* 35.3*  WBC 13.7* 14.2* 16.4*  PLT 204 253 231    COAGULATION No results for input(s): INR in the last 168 hours.  CARDIAC   Recent Labs  Lab 07/31/17 1906 08/01/17 0145 08/01/17 0652 08/02/17 1849 08/03/17 0617  TROPONINI 0.04* 0.06* 0.06* 0.04* 0.04*   No results for input(s): PROBNP in the last 168  hours.   CHEMISTRY Recent Labs  Lab 07/31/17 1906 08/01/17 0145 08/02/17 0313 08/02/17 1852 08/03/17 0617  NA 143 142 143 146* 147*  K 4.3 4.2 4.2 4.4 4.6  CL 108 109 110 107 107  CO2 25 24 28 29 31   GLUCOSE 107* 113* 118* 128* 137*  BUN 25* 23 20 25* 29*  CREATININE 0.45 0.50 0.39* 0.38* 0.63  CALCIUM 8.3* 8.2* 8.4* 8.2* 8.2*  MG  --   --   --   --  2.1  PHOS  --   --   --   --  <1.0*   Estimated Creatinine Clearance: 56.9 mL/min (by C-G formula based on SCr of 0.63 mg/dL).   LIVER Recent Labs  Lab 07/31/17 1906 08/01/17 0145 08/02/17 0313 08/02/17 1849 08/03/17 0617  AST 115* 110* 149*  204* 396*  ALT 79* 73* 84* 110* 159*  ALKPHOS 406* 406* 468* 490* 538*  BILITOT 2.3* 2.3* 2.2* 3.4* 3.6*  PROT 5.0* 5.0* 5.3* 5.2* 5.4*  ALBUMIN 2.1* 2.0* 2.0* 2.0* 2.0*     INFECTIOUS Recent Labs  Lab 08/02/17 1849 08/02/17 2130 08/03/17 0617  LATICACIDVEN 3.1* 2.4* 3.7*     ENDOCRINE CBG (last 3)  Recent Labs    08/03/17 0010 08/03/17 0412 08/03/17 0739  GLUCAP 122* 138* 148*         IMAGING x48h  - image(s) personally visualized  -   highlighted in bold Dg Chest 1 View  Result Date: 08/02/2017 CLINICAL DATA:  New onset of shortness of breath, history COPD, stroke, metastatic lung cancer, type II diabetes mellitus, smoker EXAM: CHEST  1 VIEW COMPARISON:  Portable exam 1716 hours compared to 08/01/2017 FINDINGS: Tip of nasogastric tube projects over proximal to mid stomach. Normal heart size, mediastinal contours, and pulmonary vascularity. Diffuse BILATERAL pulmonary infiltrates again identified, question pulmonary edema versus infection, significantly increased since 06/28/2017. Moderate RIGHT pleural effusion and mild RIGHT basilar atelectasis, appears slightly increased. No pneumothorax or acute osseous findings. IMPRESSION: Increased RIGHT pleural effusion and basilar atelectasis. Persistent diffuse interstitial infiltrates, question pulmonary edema versus  atypical infection. Electronically Signed   By: Lavonia Dana M.D.   On: 08/02/2017 17:52   [place] Chest (pleural Effusion)  Result Date: 08/02/2017 CLINICAL DATA:  Evaluation of right pleural effusion prior to possible thoracentesis. EXAM: CHEST ULTRASOUND COMPARISON:  Chest x-ray on 08/01/2017 FINDINGS: Bedside ultrasound demonstrates a small volume right pleural effusion with underlying atelectatic/consolidated right lower lung. Pleural fluid volume is too small to warrant thoracentesis. The liver is partially visualized and contains numerous hypoechoic lesions. IMPRESSION: 1. Small volume right pleural effusion which is too small to warrant thoracentesis currently. 2. Multiple liver lesions. Electronically Signed   By: Aletta Edouard M.D.   On: 08/02/2017 11:08   Dg Chest Port 1 View  Result Date: 08/03/2017 CLINICAL DATA:  Endotracheal tube. EXAM: PORTABLE CHEST 1 VIEW COMPARISON:  Radiograph August 02, 2017. FINDINGS: Stable cardiomediastinal silhouette. Endotracheal and nasogastric tubes are unchanged in position. No pneumothorax is noted. Interval placement of right-sided PICC line with distal tip in expected position of the SVC. Diffuse interstitial densities are noted throughout both lungs consistent with pulmonary edema or possibly inflammation. Moderate right pleural effusion is noted. Old right rib fracture is noted. IMPRESSION: Endotracheal and nasogastric tubes are unchanged in position. Interval placement of right-sided PICC line. Stable bilateral lung opacities are noted concerning for edema or possibly inflammation. Moderate right pleural effusion is noted. Electronically Signed   By: Marijo Conception, M.D.   On: 08/03/2017 07:11   Dg Chest Port 1 View  Result Date: 08/02/2017 CLINICAL DATA:  Endotracheal tube placement EXAM: PORTABLE CHEST 1 VIEW COMPARISON:  08/02/2017 FINDINGS: Endotracheal tube 2 cm above the carina.  NG tube in the stomach Severe diffuse bilateral airspace disease  unchanged. Moderate right effusion unchanged. Right lower lobe atelectasis unchanged. IMPRESSION: Endotracheal tube in satisfactory position. No change in extensive diffuse bilateral airspace disease and right pleural effusion. Electronically Signed   By: Franchot Gallo M.D.   On: 08/02/2017 19:43   Dg Chest Port 1 View  Result Date: 08/01/2017 CLINICAL DATA:  Sob; lung ca EXAM: PORTABLE CHEST 1 VIEW COMPARISON:  07/31/2017 FINDINGS: Heart size is normal. Diffuse reticular opacities are identified throughout the lungs bilaterally. More focal opacity is identified in the MEDIAL RIGHT lung base,  stable in appearance. There is a RIGHT pleural effusion. RIGHT paratracheal density is consistent with known adenopathy. IMPRESSION: Persistent significant lung opacities, more confluent at the RIGHT lung base. Mediastinal adenopathy. Electronically Signed   By: Nolon Nations M.D.   On: 08/01/2017 13:05   Dg Abd Portable 1v  Result Date: 08/01/2017 CLINICAL DATA:  S/p NG tube placement EXAM: PORTABLE ABDOMEN - 1 VIEW COMPARISON:  None. FINDINGS: Nasogastric tube is in place, tip overlying the level of the proximal to mid stomach. Bowel gas pattern is nonobstructive. Persistent significant lung opacities and RIGHT pleural effusion. IMPRESSION: Interval placement of nasogastric tube, tip overlying the level of the proximal stomach. Electronically Signed   By: Nolon Nations M.D.   On: 08/01/2017 16:21   [place] Ekg Site Rite  Result Date: 08/02/2017 If Site Rite image not attached, placement could not be confirmed due to current cardiac rhythm.     ASSESSMENT / PLAN:  PULMONARY A: #baseline: copd . 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 Sinus tachy +. At risk for shock  P:  monitor  RENAL  Intake/Output Summary (Last 24 hours) at 08/03/2017 0925 Last data filed at 08/03/2017 0600 Gross per 24 hour  Intake 1621.37 ml  Output 415 ml  Net 1206.37 ml   Recent Labs  Lab 07/31/17 1906 08/01/17 0145 08/02/17 0313 08/02/17 1852 08/03/17 0617  CREATININE 0.45 0.50 0.39* 0.38* 0.63   Estimated Creatinine Clearance: 56.9 mL/min (by C-G formula based on SCr of 0.63 mg/dL).   A:   #baseline: solitary kidney - s/p nephrectomy (baseline 0.7-0.9) _  08/03/2017 -> maintains Creat but at risk for AKI (noted - lean bMI and soliatry kidney ) but has lacti acidosis. Has severe low phos  P:   Maintain bp/hr Replete phos  GASTROINTESTINAL A:   Abd pain with liver mets pre nitubation  On 08/03/2017 -> Tolerating TF . Has worsening transaminitis  P:   Dc tylenol Continue TF Monitor LFt  HEMATOLOGIC Recent Labs  Lab 08/01/17 0145 08/02/17 0313 08/03/17 0617  HGB 11.1* 11.1* 11.2*  HCT 35.4* 34.9* 35.3*  WBC 13.7* 14.2* 16.4*  PLT 204 253 231    A:   #RBC: anemia of critical illness #Platelet normal #WBC worsenin leukoptcyosios -  >? sepsis  P:  - PRBC for hgb </= 6.9gm%    - exceptions are   -  if ACS susepcted/confirmed then transfuse for hgb </= 8.0gm%,  or    -  active bleeding with hemodynamic instability, then transfuse regardless of hemoglobin value   At at all times try to transfuse 1 unit prbc as possible with exception of active hemorrhage    INFECTIOUS No results for input(s): PROCALCITON in the last 168 hours.  Results for orders placed or performed during the hospital encounter of 08/02/2017  Culture, blood  (routine x 2) Call MD if unable to obtain prior to antibiotics being given  Status: None   Collection Time: 08/03/2017 11:59 PM  Result Value Ref Range Status   Specimen Description   Final    BLOOD LEFT ANTECUBITAL Performed at [facility] [address], [place], [place] [address]    Special Requests   Final    BOTTLES DRAWN AEROBIC AND ANAEROBIC Blood Culture results may not be optimal due to an excessive volume of blood received in culture bottles Performed at [facility] [address] [place], [place] [address]    Culture   Final    NO GROWTH 5 DAYS Performed at [facility], [place] [address], [place], [place] [address]    Report Status 07/29/2017 FINAL  Final  Culture, blood (routine x 2) Call MD if unable to obtain prior to antibiotics being given     Status: None   Collection Time: 07/25/17  4:45 AM  Result Value Ref Range Status   Specimen Description   Final    BLOOD RIGHT ANTECUBITAL Performed at [facility] [address], [place], [place] [address]    Special Requests   Final    BOTTLES DRAWN AEROBIC ONLY Blood Culture adequate volume Performed at [facility] [address] [place], [place] [address]    Culture   Final    NO GROWTH 5 DAYS Performed at [facility], [place] [address], [place], [place] [address]    Report Status 07/30/2017 FINAL  Final  MRSA PCR Screening     Status: None   Collection Time: 07/25/17  8:04 AM  Result Value Ref Range Status   MRSA by PCR NEGATIVE NEGATIVE Final    Comment:        The GeneXpert MRSA Assay (FDA approved for NASAL specimens only), is one component of a comprehensive MRSA colonization surveillance program. It is not intended to diagnose MRSA infection nor to guide or monitor treatment for MRSA infections. Performed at [facility], [place] [address], [place],  [address]   Gastrointestinal Panel by  PCR , Stool     Status: Abnormal   Collection Time: 07/29/17  4:45 PM  Result Value Ref Range Status   Campylobacter species NOT DETECTED NOT DETECTED Final   Plesimonas shigelloides NOT DETECTED NOT DETECTED Final   Salmonella species NOT DETECTED NOT DETECTED Final   Yersinia enterocolitica NOT DETECTED NOT DETECTED Final   Vibrio species NOT DETECTED NOT DETECTED Final   Vibrio cholerae NOT DETECTED NOT DETECTED Final   Enteroaggregative E coli (EAEC) NOT DETECTED NOT DETECTED Final   Enteropathogenic E coli (EPEC) DETECTED (A) NOT DETECTED Final    Comment: RESULT CALLED TO, READ BACK BY AND VERIFIED WITH: DANA GUNDLACH 07/30/17 @ 1509  MLK    Enterotoxigenic E coli (ETEC) NOT DETECTED NOT DETECTED Final   Shiga like toxin producing E coli (STEC) NOT DETECTED NOT DETECTED Final   Shigella/Enteroinvasive E coli (EIEC) NOT DETECTED NOT DETECTED Final   Cryptosporidium NOT DETECTED NOT DETECTED Final   Cyclospora cayetanensis NOT DETECTED NOT DETECTED Final   Entamoeba histolytica NOT DETECTED NOT DETECTED Final   Giardia lamblia NOT DETECTED NOT DETECTED Final   Adenovirus F40/41 NOT DETECTED NOT DETECTED Final   Astrovirus NOT DETECTED NOT DETECTED Final   Norovirus GI/GII NOT DETECTED NOT DETECTED Final   Rotavirus A NOT DETECTED NOT DETECTED Final   Sapovirus (I, II, IV, and V) NOT DETECTED NOT DETECTED Final    Comment: Performed at [employer]  [facility], [address] [place], [place] [address]    A:   New fever with rising wbc and lactate - suspect sepsis P:   Start vanc and zosyn Anti-infectives (From admission, onward)   Start     Dose/Rate Route Frequency Ordered Stop   07/24/17 2200  vancomycin (VANCOCIN) IVPB 750 mg/150 ml premix  Status:  Discontinued     750 mg 150 mL/hr over 60 Minutes Intravenous Every 24 hours 07/25/2017 2334 07/25/17 1327   07/24/17 0600  ceFEPIme (MAXIPIME) 1 g in sodium chloride 0.9 % 100 mL IVPB     1 g 200 mL/hr over 30 Minutes  Intravenous Every 8 hours 07/06/2017 2322 08/01/17 0030   07/09/2017 2145  vancomycin (VANCOCIN) IVPB 1000 mg/200 mL premix     1,000 mg 200 mL/hr over 60 Minutes Intravenous  Once 08/01/2017 2143 07/15/2017 2353   07/27/2017 2145  ceFEPIme (MAXIPIME) 2 g in sodium chloride 0.9 % 100 mL IVPB     2 g 200 mL/hr over 30 Minutes Intravenous  Once 07/25/2017 2143 07/31/2017 2230       ENDOCRINE A:   At risk hyperglycemia   P:   ICU hyperglycemia protocol  NEUROLOGIC A:   #Baseline : ? Chronic pain  #Current: pain pre- intubatin issue also high ammoia but no pre intubation depriium   08/03/2017 -> RASS -3 on fent gtt P:   RASS goal: o to -2 fent gtt   FAMILY  - Updates: 08/03/2017 --> family extensivey  - Inter-disciplinary family meet or Palliative Care meeting due by:  DAy 7. Current LOS is LOS 11 days   DISPO Keep in ICU    The patient is critically ill with multiple organ systems failure and requires high complexity decision making for assessment and support, frequent evaluation and titration of therapies, application of advanced monitoring technologies and extensive interpretation of multiple databases.   Critical Care Time devoted to patient care services described in this note is  45  Minutes. This time reflects time of care of this signee Dr Brand Males. This critical care time does not reflect procedure time, or teaching time or supervisory time of PA/NP/Med student/Med Resident etc but could involve care discussion time    Dr. Brand Males, M.D., [facility].C.P Pulmonary and Critical Care Medicine Staff Physician [facility] Pager: [phone], If no answer or between  15:00h - 7:00h: call [phone]  08/03/2017 9:25 AM

## 2017-08-03 NOTE — Progress Notes (Signed)
Daily Progress Note   Patient Name: Wanda James       Date: 08/03/2017 DOB: August 19, 1955  Age: 62 y.o. MRN#: [id number] Attending Physician: Wanda Males, MD Primary Care Physician: Wanda Alken, PA-C Admit Date: 07/18/2017  Reason for Consultation/Follow-up: Establishing goals of care  Subjective: Patient intubated and sedated.  I met at bedside with her daughter, Wanda James, and grandson.  Granddaughter, Wanda James, and patient's son also joined midway through encounter.    Reviewed events of the last 24 hours with family including concerns for respiratory failure requiring intubation, liver failure, and concern she is starting to have decrease in urine output.  Her daughter (works as Chartered certified accountant at [employer]) expressed understanding how ill her mother is. Most of our conversation revolved around the fact she is upset that her monther's lung cancer was just discovered this admission.  She relays that she is "frustrated" as she feels that her mother would be doing better if she had a CT scan and discovered the cancer during previous admission for PNA.  I provided supportive listening and answered her questions to best of my ability.  The patient's granddaughter Wanda James) then joined [place] and inquired why her grandmother is not being evaluated for a liver transplant.  We discussed her current clinical situation.  Discussed reasons I believe 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     Her granddaughter did not seem satisfied with my explanation and is upset that she has not spoken to oncology today.  Nursing has been in touch with Dr. Alvy Bimler per family request and a meeting is scheduled for tomorrow.     Patient's husband was not present at the bedside today during my encounter.  Length of Stay: 11  Current Medications: Scheduled Meds:  . budesonide (PULMICORT) nebulizer solution  0.25 mg Nebulization BID  . chlorhexidine gluconate (MEDLINE KIT)  15 mL Mouth Rinse BID  . Chlorhexidine Gluconate Cloth  6 each Topical Daily  . dexamethasone  4 mg Intravenous Q24H  . diclofenac sodium  4 g Topical TID  . enoxaparin (LOVENOX) injection  40 mg Subcutaneous Daily  . escitalopram  5 mg Oral Daily  . fentaNYL (SUBLIMAZE) injection  50 mcg Intravenous Once  . free water  100 mL Per Tube Q4H  . insulin aspart  1-3 Units Subcutaneous Q4H  . ipratropium-albuterol  3 mL Nebulization Q4H  . lactulose  20 g Per Tube BID  . lactulose  300 mL Rectal Once  . lipase/protease/amylase  12,000 Units Oral TID AC  . mouth rinse  15 mL Mouth Rinse 10 times per day  . metoCLOPramide  10 mg Oral TID AC  . multivitamin  1 tablet Oral Daily  . polyethylene glycol  17 g Oral Daily  . ranitidine  150 mg Per Tube BID  . sodium bicarbonate  50 mEq Intravenous Once  . sodium chloride flush  10-40 mL Intracatheter Q12H    Continuous Infusions: . ceFEPime (MAXIPIME) IV Stopped (08/03/17 1111)  . feeding supplement (OSMOLITE 1.2 CAL) 1,000 mL (08/03/17 1105)  . fentaNYL infusion INTRAVENOUS 100 mcg/hr (08/03/17 1150)  . potassium PHOSPHATE IVPB (in mmol) 30 mmol (08/03/17 0925)  . propofol (DIPRIVAN) infusion    . [START ON 08/04/2017] vancomycin      PRN Meds: fentaNYL, levalbuterol, LORazepam, metoprolol tartrate, midazolam, midazolam, naphazoline-glycerin, ondansetron (ZOFRAN) IV, phenol, sodium chloride flush  Physical Exam         Intubated and sedated Tachycardic.  No murmur Abdomen soft, + distention Skin intact.  Vital Signs: BP 121/78   Pulse (!) 136   Temp 98.8 F (37.1 C) (Oral)   Resp 17   Ht _0  (1.575 m)   Wt 56.3 kg (124 lb 1.9 oz)   SpO2 94%   BMI 22.70 kg/m  SpO2: SpO2: 94  % O2 Device: O2 Device: Ventilator O2 Flow Rate: O2 Flow Rate (L/min): 7 L/min  Intake/output summary:   Intake/Output Summary (Last 24 hours) at 08/03/2017 1429 Last data filed at 08/03/2017 7124 Gross per 24 hour  Intake 1453.07 ml  Output 310 ml  Net 1143.07 ml   LBM: Last BM Date: (P) 08/02/17 Baseline Weight: Weight: 51.3 kg (113 lb) Most recent weight: Weight: 56.3 kg (124 lb 1.9 oz)       Palliative Assessment/Data:    Flowsheet Rows     Most Recent Value  Intake Tab  Referral Department  Hospitalist  Unit at Time of Referral  -- [urology/telementry]  Palliative Care Primary Diagnosis  Cancer  Date Notified  07/30/17  Palliative Care Type  New Palliative care  Reason Not Seen  Consult cancelled  Reason for referral  Pain, Non-pain Symptom, Clarify Goals of Care  Date of Admission  07/16/2017  Date first seen by Palliative Care  07/31/17  # of days Palliative referral response time  1 Day(s)  # of days IP prior to Palliative referral  7  Clinical Assessment  Psychosocial & Spiritual Assessment  Palliative Care Outcomes      Patient Active Problem List   Diagnosis Date Noted  . Acute and chronic respiratory failure (acute-on-chronic) ([place])   . Malnutrition of moderate degree 08/02/2017  . Elevated troponin 08/01/2017  . Hypoxia   . Generalized anxiety disorder   . Abdominal pain   . Nausea & vomiting   . Lung cancer ([place]) 07/27/2017  . Cancer associated pain   . Metastases to the liver ([place])   . Other constipation   . HCAP (healthcare-associated pneumonia) 07/13/2017  . CAP (community acquired pneumonia) 06/28/2017  . New onset type 2 diabetes mellitus ([place]) 06/28/2017  . Leukocytosis 06/28/2017  . COPD exacerbation ([place]) 06/26/2017  . Acute hypoxemic respiratory failure ([place]) 06/26/2017  . GERD (gastroesophageal reflux disease) 06/26/2017  .  Tobacco abuse 06/26/2017  . COPD UNSPECIFIED 04/29/2009  . DYSLIPIDEMIA 03/10/2009  . 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 Chest pain 02/04/2009  . TOBACCO ABUSE 01/23/2009  . DEPRESSION 01/23/2009    Palliative Care Assessment & Plan   Patient Profile:    Assessment: acute hypoxic resp failure Nausea, dysphagia R hilar mass Liver metastasis Post obstructive PNA Severe protein calorie malnutrition  Anxiety  History of smoking   Recommendations/Plan:  Family invested in plan for continued aggressive therapies.    Family is struggling with how suddenly Ms. Kling has become ill.  They are in various stages of acceptance of how critically ill she is at this point.  Palliative to continue to check in intermittently to support family and progress conversation based on her clinical course and as family is emotionally able to do so.   Code Status:    Code Status Orders  (From admission, onward)        Start     Ordered   07/31/17 2150  Limited resuscitation (code)  Continuous    Question Answer Comment  In the event of cardiac or respiratory ARREST: Initiate Code Blue, Call Rapid Response Yes   In the event of cardiac or respiratory ARREST: Perform CPR No   In the event of cardiac or respiratory ARREST: Perform Intubation/Mechanical Ventilation Yes   In the event of cardiac or respiratory ARREST: Use NIPPV/BiPAp only if indicated Yes   In the event of cardiac or respiratory ARREST: Administer ACLS medications if indicated Yes   In the event of cardiac or respiratory ARREST: Perform Defibrillation or Cardioversion if indicated Yes      07/31/17 2150    Code Status History    Date Active Date Inactive Code Status Order ID Comments User Context   07/31/2017 2142 07/31/2017 2150 Partial Code [id number]  Neila Gear, NP Inpatient   07/10/2017 2322 07/31/2017 2142 Full Code [id number]  Jani Gravel, MD ED   06/26/2017 1753 07/01/2017 1630 Full Code [id number]  Dessa Phi, DO Inpatient       Prognosis:   Guarded  Discharge Planning:  To Be Determined  Care plan was  discussed with Daughter, son, grandson, and granddaughter.  Thank you for allowing the Palliative Medicine Team to assist in the care of this patient.   Time In: 1215 Time Out: 1310 Total Time 55 Prolonged Time Billed  no       Greater than 50%  of this time was spent counseling and coordinating care related to the above assessment and plan.  Micheline Rough, MD [facility] Team [phone]  Please contact Palliative Medicine Team phone at [phone] for questions and concerns.

## 2017-08-03 NOTE — Progress Notes (Signed)
ANTICOAGULATION CONSULT NOTE - follow up  Pharmacy Consult for IV heparin Indication: pulmonary embolus   Medications: Infusions:  . feeding supplement (OSMOLITE 1.2 CAL) 1,000 mL (08/02/17 2200)  . fentaNYL infusion INTRAVENOUS 75 mcg/hr (08/03/17 0600)  . heparin Stopped (08/02/17 1834)  . propofol (DIPRIVAN) infusion    .  sodium bicarbonate  infusion 1000 mL 50 mL/hr at 08/03/17 0600    Assessment: 62 yo female admitted with CP with hx renal cell carcinoma now with suspected PE per CCM to start IV heparin per pharmacy dosing.  VQ scan - intermediate probability for PE Dopplers: negative for DVT  Re-intubated 7/30 at 19:05; eLink recommended to hold heparin post-intubation.  Today, 08/03/17  Heparin level not obtained  CBC: Hgb 11.2, Plt 231  No bleeding or complications noted.  Plan: Discontinue heparin IV, labs.   Gretta Arab PharmD, BCPS Pager [phone] 08/03/2017 7:12 AM

## 2017-08-03 NOTE — Progress Notes (Signed)
I spoke with Cecille Rubin, RN in the ICU, at [phone]. We will continue to hold the CT Abd/Pelvis until we are told otherwise. On the original date of CT order, patient's family did not want CT until her pain was under control. The next day the patient did not want any radiation. Per tech notes on the study history, MD wanted the radiation treatment before the CT scan. We have been in contact with the patient's RN each day. We can no longer see this patient's CT orders on our tech work list. Please advise if we are to continue or cancel.

## 2017-08-03 NOTE — Progress Notes (Signed)
SLP Cancellation Note  Patient Details Name: Wanda James MRN: [id number] DOB: 03-29-1955   Cancelled treatment:       Reason Eval/Treat Not Completed: Medical issues which prohibited therapy(pt now intubated, slp to sign off, please reorder when/if indicated)   Macario Golds 08/03/2017, 8:13 AM  Luanna Salk, [facility] SLP [phone]

## 2017-08-04 ENCOUNTER — Inpatient Hospital Stay (HOSPITAL_COMMUNITY): Payer: BLUE CROSS/BLUE SHIELD

## 2017-08-04 ENCOUNTER — Ambulatory Visit
Admit: 2017-08-04 | Discharge: 2017-08-04 | Disposition: A | Discharge status: Home / Self Care | Payer: BLUE CROSS/BLUE SHIELD | Attending: Radiation Oncology | Admitting: Radiation Oncology

## 2017-08-04 ENCOUNTER — Ambulatory Visit: Payer: BLUE CROSS/BLUE SHIELD

## 2017-08-04 DIAGNOSIS — E7220 Disorder of urea cycle metabolism, unspecified: Secondary | ICD-10-CM

## 2017-08-04 DIAGNOSIS — R079 Chest pain, unspecified: Secondary | ICD-10-CM

## 2017-08-04 DIAGNOSIS — E878 Other disorders of electrolyte and fluid balance, not elsewhere classified: Secondary | ICD-10-CM

## 2017-08-04 DIAGNOSIS — C3490 Malignant neoplasm of unspecified part of unspecified bronchus or lung: Secondary | ICD-10-CM

## 2017-08-04 DIAGNOSIS — R000 Tachycardia, unspecified: Secondary | ICD-10-CM

## 2017-08-04 DIAGNOSIS — E872 Acidosis: Secondary | ICD-10-CM

## 2017-08-04 DIAGNOSIS — E875 Hyperkalemia: Secondary | ICD-10-CM

## 2017-08-04 DIAGNOSIS — K7290 Hepatic failure, unspecified without coma: Secondary | ICD-10-CM

## 2017-08-04 DIAGNOSIS — B9620 Unspecified Escherichia coli [E. coli] as the cause of diseases classified elsewhere: Secondary | ICD-10-CM

## 2017-08-04 LAB — BLOOD GAS, ARTERIAL
ACID-BASE EXCESS: 0.8 mmol/L (ref 0.0–2.0)
BICARBONATE: 24.8 mmol/L (ref 20.0–28.0)
Drawn by: 331471
FIO2: 50
LHR: 16 {breaths}/min
MECHVT: 400 mL
O2 SAT: 91.7 %
PO2 ART: 71.4 mmHg — AB (ref 83.0–108.0)
Patient temperature: 100.6
pCO2 arterial: 41.4 mmHg (ref 32.0–48.0)
pH, Arterial: 7.4 (ref 7.350–7.450)

## 2017-08-04 LAB — CBC WITH DIFFERENTIAL/PLATELET
Basophils Absolute: 0.1 10*3/uL (ref 0.0–0.1)
Basophils Relative: 0 %
Eosinophils Absolute: 0.1 10*3/uL (ref 0.0–0.7)
Eosinophils Relative: 0 %
HCT: 29.3 % — ABNORMAL LOW (ref 36.0–46.0)
HEMOGLOBIN: 9.2 g/dL — AB (ref 12.0–15.0)
LYMPHS PCT: 1 %
Lymphs Abs: 0.3 10*3/uL (ref 0.7–4.0)
MCH: 29 pg (ref 26.0–34.0)
MCHC: 31.4 g/dL (ref 30.0–36.0)
MCV: 92.4 fL (ref 78.0–100.0)
MONO ABS: 1 10*3/uL (ref 0.1–1.0)
MONOS PCT: 5 %
NEUTROS ABS: 17.9 10*3/uL (ref 1.7–7.7)
Neutrophils Relative %: 94 %
Platelets: 181 10*3/uL (ref 150–400)
RBC: 3.17 MIL/uL — ABNORMAL LOW (ref 3.87–5.11)
RDW: 18.2 % — ABNORMAL HIGH (ref 11.5–15.5)
WBC: 19.3 10*3/uL — ABNORMAL HIGH (ref 4.0–10.5)

## 2017-08-04 LAB — PROTIME-INR
INR: 1.5
Prothrombin Time: 18 seconds — ABNORMAL HIGH (ref 11.4–15.2)

## 2017-08-04 LAB — GLUCOSE, CAPILLARY
GLUCOSE-CAPILLARY: 104 mg/dL — AB (ref 70–99)
GLUCOSE-CAPILLARY: 132 mg/dL — AB (ref 70–99)
GLUCOSE-CAPILLARY: 134 mg/dL — AB (ref 70–99)
GLUCOSE-CAPILLARY: 173 mg/dL — AB (ref 70–99)
GLUCOSE-CAPILLARY: 205 mg/dL — AB (ref 70–99)
Glucose-Capillary: 114 mg/dL — ABNORMAL HIGH (ref 70–99)

## 2017-08-04 LAB — COMPREHENSIVE METABOLIC PANEL
ALT: 504 U/L — AB (ref 0–44)
AST: 1869 U/L — AB (ref 15–41)
Albumin: 2.6 g/dL — ABNORMAL LOW (ref 3.5–5.0)
Alkaline Phosphatase: 580 U/L — ABNORMAL HIGH (ref 38–126)
Anion gap: 12 (ref 5–15)
BUN: 38 mg/dL — ABNORMAL HIGH (ref 8–23)
CALCIUM: 7.9 mg/dL — AB (ref 8.9–10.3)
CO2: 28 mmol/L (ref 22–32)
Chloride: 101 mmol/L (ref 98–111)
Creatinine, Ser: 0.58 mg/dL (ref 0.44–1.00)
GFR calc non Af Amer: >60 mL/min (ref >60)
GLUCOSE: 127 mg/dL — AB (ref 70–99)
POTASSIUM: 6.1 mmol/L — AB (ref 3.5–5.1)
SODIUM: 141 mmol/L (ref 135–145)
TOTAL PROTEIN: 5.4 g/dL — AB (ref 6.5–8.1)
Total Bilirubin: 5.3 mg/dL — ABNORMAL HIGH (ref 0.3–1.2)

## 2017-08-04 LAB — BASIC METABOLIC PANEL
ANION GAP: 14 (ref 5–15)
Anion gap: 13 (ref 5–15)
BUN: 42 mg/dL — ABNORMAL HIGH (ref 8–23)
BUN: 42 mg/dL — ABNORMAL HIGH (ref 8–23)
CHLORIDE: 102 mmol/L (ref 98–111)
CO2: 28 mmol/L (ref 22–32)
CO2: 25 mmol/L (ref 22–32)
CREATININE: 0.62 mg/dL (ref 0.44–1.00)
Calcium: 8.3 mg/dL — ABNORMAL LOW (ref 8.9–10.3)
Calcium: 8.3 mg/dL — ABNORMAL LOW (ref 8.9–10.3)
Chloride: 103 mmol/L (ref 98–111)
Creatinine, Ser: 0.51 mg/dL (ref 0.44–1.00)
GFR calc Af Amer: >60 mL/min (ref >60)
GFR calc non Af Amer: >60 mL/min (ref >60)
GLUCOSE: 128 mg/dL — AB (ref 70–99)
Glucose, Bld: 148 mg/dL — ABNORMAL HIGH (ref 70–99)
POTASSIUM: 6.4 mmol/L — AB (ref 3.5–5.1)
Potassium: 6.1 mmol/L — ABNORMAL HIGH (ref 3.5–5.1)
SODIUM: 140 mmol/L (ref 135–145)
Sodium: 145 mmol/L (ref 135–145)

## 2017-08-04 LAB — CK TOTAL AND CKMB (NOT AT ARMC)
CK TOTAL: 730 U/L — AB (ref 38–234)
CK, MB: 2 ng/mL (ref 0.5–5.0)
CK, MB: 2.9 ng/mL (ref 0.5–5.0)
RELATIVE INDEX: 0.4 (ref 0.0–2.5)
Relative Index: 0.3 (ref 0.0–2.5)
Total CK: 790 U/L — ABNORMAL HIGH (ref 38–234)

## 2017-08-04 LAB — CBC
HEMATOCRIT: 30.7 % — AB (ref 36.0–46.0)
HEMOGLOBIN: 9.8 g/dL — AB (ref 12.0–15.0)
MCH: 29.1 pg (ref 26.0–34.0)
MCHC: 31.9 g/dL (ref 30.0–36.0)
MCV: 91.1 fL (ref 78.0–100.0)
Platelets: 193 10*3/uL (ref 150–400)
RBC: 3.37 MIL/uL — AB (ref 3.87–5.11)
RDW: 17.9 % — ABNORMAL HIGH (ref 11.5–15.5)
WBC: 19.4 10*3/uL — AB (ref 4.0–10.5)

## 2017-08-04 LAB — LACTIC ACID, PLASMA
Lactic Acid, Venous: 4.8 mmol/L (ref 0.5–1.9)
Lactic Acid, Venous: 6.2 mmol/L (ref 0.5–1.9)

## 2017-08-04 LAB — PROCALCITONIN: Procalcitonin: 7.53 ng/mL

## 2017-08-04 LAB — PHOSPHORUS: PHOSPHORUS: 2.2 mg/dL — AB (ref 2.5–4.6)

## 2017-08-04 LAB — MAGNESIUM: Magnesium: 2.1 mg/dL (ref 1.7–2.4)

## 2017-08-04 MED ORDER — DILTIAZEM HCL-DEXTROSE 100-5 MG/100ML-% IV SOLN (PREMIX)
5.0000 mg/h | INTRAVENOUS | Status: DC
  Administered 2017-08-04: 5 mg/h via INTRAVENOUS
  Administered 2017-08-05 (×2): 15 mg/h via INTRAVENOUS
  Filled 2017-08-04 (×3): qty 100

## 2017-08-04 MED ORDER — SODIUM BICARBONATE 8.4 % IV SOLN
INTRAVENOUS | Status: DC
  Administered 2017-08-04 – 2017-08-05 (×2): via INTRAVENOUS
  Filled 2017-08-04 (×2): qty 150

## 2017-08-04 MED ORDER — SODIUM CHLORIDE 0.9 % IV SOLN
INTRAVENOUS | Status: DC | PRN
  Administered 2017-08-04 – 2017-08-05 (×2): 250 mL via INTRAVENOUS

## 2017-08-04 MED ORDER — ALBUMIN HUMAN 5 % IV SOLN
25.0000 g | Freq: Once | INTRAVENOUS | Status: AC
  Administered 2017-08-04: 25 g via INTRAVENOUS
  Filled 2017-08-04: qty 500

## 2017-08-04 MED ORDER — ALBUMIN HUMAN 25 % IV SOLN
INTRAVENOUS | Status: AC
Start: 2017-08-04 — End: 2017-08-04
  Filled 2017-08-04: qty 50

## 2017-08-04 MED ORDER — SODIUM POLYSTYRENE SULFONATE 15 GM/60ML PO SUSP
30.0000 g | Freq: Once | ORAL | Status: AC
  Administered 2017-08-04: 30 g
  Filled 2017-08-04: qty 120

## 2017-08-04 NOTE — Progress Notes (Signed)
Palliative care progress note  I checked in briefly with family today.  They have had multiple conversations today regarding prognosis and goals.  Denied needs, questions or wanting to discuss further today.    Will continue to follow, support family, and progress conversation as they are able.  Micheline Rough, MD [facility] Team [phone]  NO CHARGE NOTE

## 2017-08-04 NOTE — Progress Notes (Signed)
Patient transported to and from ICU 1222 to Radiation and CT on 100% Fi02- uneventful. RN at bedside.

## 2017-08-04 NOTE — Progress Notes (Signed)
   08/04/17 1900  Clinical Encounter Type  Visited With Patient and family together;Health care provider  Visit Type Initial;Patient actively dying;Spiritual support  Referral From Nurse  Consult/Referral To Chaplain  Spiritual Encounters  Spiritual Needs Emotional;Grief support  Stress Factors  Patient Stress Factors None identified  Family Stress Factors Loss;Loss of control   Name: Wanda James, Wanda James  Location: [facility] [address] of call: Pastoral care and palliative care   Chaplain was paged to provide emotional support to family who received news of the Pt's poor prognosis toady. When chaplain entered the room family members where at bedside. Family's main concern at the moment:  A) Getting the Son (who is in the [facility]) in connected to the right people so that he could visit his mother (Pt)   Inmate's name: Rodman Key  Inmate's number: [phone]. Chaplain called the [facility] [phone] and was told to call back after 8:00am EST.    Family not ready to talk to chaplain at this time but said they would be open for a visit later.

## 2017-08-04 NOTE — Progress Notes (Addendum)
Name: Wanda James MRN: [id number] DOB: 15-Sep-1955    ADMISSION DATE:  07/21/2017 CONSULTATION DATE:  7/21  REFERRING MD :  N[place]ada Crane  CHIEF COMPLAINT:  Abnormal CT chest   BRIEF PATIENT DESCRIPTION/HPI    brief This is a 62 year old female patient with a significant history of renal cell carcinoma status post prior left nephrectomy, tobacco abuse, prob COPD with oxygen dependence since recent admit for PNA at end of June. Presented to the emergency room with chief complaint of: Approximately 1 week history of worsening cough initially productive of sputum with black spots, shortness of breath, decreased p.o. appetite with shortness of breath particularly worse over the prior 2 days.    Recently discharged on the hospital in 6/23 2019 where she was admitted for acute acute exacerbation COPD and also pneumonia she was discharged home with prednisone taper, oxygen, and Levaquin. Review of imaging during that hospitalization did show right lower lobe airspace disease, this appeared to improve some on serial films prior to discharge.  She was seen once again by her primary care provider on 7/17 for worsening of symptoms at that time she was started on Biaxin, her symptoms have not improved and therefore she presented to the emergency room.   On presentation this time a CT of chest was obtained this demonstrated: Partial right lower lobe collapse/atelectasis with displacement of the right fissure, mediastinal adenopathy, subtle hypodense masslike abnormality of the liver.  She was admitted with a working diagnosis of recurrent pneumonia with possible lung mass.  Pulmonary has been asked to evaluate in regards to CT findings.    has a past medical history of Acid reflux, Arthritis, Cancer ([place]), Cataract, Chiari malformation type I ([place]), Complication of anesthesia, COPD (chronic obstructive pulmonary disease) ([place]), Diverticulitis, Headache, Pneumonia, Stroke ([place]), and Subclavian artery stenosis,  left ([place]).   has a past surgical history that includes carotidendarterectomy; Nephrectomy; Abdominal hysterectomy; Inner ear surgery; Eye surgery; Tonsillectomy; Colon surgery; Partial colectomy; Tubal ligation; Breast surgery; Rhinoplasty; and Lumbar laminectomy/decompression microdiscectomy (N/A, 08/19/2016).   EVENTS CT chest 7/21: Partial right lower lobe collapse/atelectasis with displacement of the right major fissure and caudal displacement of the right hilum.  There is also mediastinal lymphadenopathy and a subtle hypodensity masslike area in the liver   7/21 - pccm consult  7/22 - liver biopsy recommended and ccm signed off  7/23 - s/p liver biopsy  (MRI [place] negative for mets) - small lung - suggestive of primary  lung cancer  7/28 - intermediate prob for VQ  7/29 - pccm recalled. Per family - 2L [place] x 1 month. Then 5LNC few days and overnigth 15LNC. VQ scan intermediate prob technically. 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 No calf swelling. Cannot get IV dye due to allergy. Maintains BP.  Has needed morphine for cancer related pain. Per RN -  Has refused XRT x 2 days due to nause and being "tired and dont know" per   7/30 - code status reversed to full code. On IV heparin empiric (duplex neg for dvt) based on intermediate prob vq and worsening hypoxemia. O2 needs now improved to 7L [place]. Husband at bedside - feels she is some better. Reports moaning due to abd pain every 3h and getting morphine. Denies any confusion. Goal is palliative  XRT for mediastinal adenopathy and RLL collapse while in ICU with full code and if/when ECOG improves.   7/31 - intubated last night due to worsening hypoxemia and distress despite IV heparin (  DVT negative and ECHO no RV strian). That am- sedated on fent gtt. Phos being repleted. Maintains bp/hr/ur op. XRT pending later today.  Family at bedside.  T max 103 with rising wbc and lactic > 3  SUBJECTIVE/OVERNIGHT/INTERVAL HX 8/1 tachycardic.  BP stable. CXR stable. Abd pain w/ rising LFTs  VITAL SIGNS: Temp:  [97.9 F (36.6 C)-99.5 F (37.5 C)] 97.9 F (36.6 C) (08/01 0800) Pulse Rate:  [120-132] 131 (08/01 0600) Resp:  [14-33] 24 (08/01 0600) BP: (101-142)/(41-91) 128/85 (08/01 0600) SpO2:  [92 %-96 %] 94 % (08/01 0814) FiO2 (%):  [40 %-50 %] 50 % (08/01 0814) Weight:  [123 lb 7.3 oz (56 kg)] 123 lb 7.3 oz (56 kg) (08/01 0500)  Intake/Output Summary (Last 24 hours) at 08/04/2017 0926 Last data filed at 08/04/2017 0600 Gross per 24 hour  Intake 1827.18 ml  Output 575 ml  Net 1252.18 ml    PHYSICAL EXAMINATION:   General- 62 year old female. Sedated on vent.  HENT NCAT orally intubated. No JVD MMM Pulm diffuse rhonchi decreased on right  Card tachycardic RRR abd tender and distended + bowel sounds gu cnc yellow urine Neuro sedated, moves all ext     PULMONARY Recent Labs  Lab 07/29/17 1037 07/31/17 1916 08/02/17 1819 08/02/17 2015  PHART 7.377 7.396 7.452* 7.388  PCO2ART 43.7 43.1 38.7 48.4*  PO2ART 146* 70.1* 45.4* 113*  HCO3 25.1 25.6 26.6 28.5*  O2SAT 98.4 92.2 82.2 95.8    CBC Recent Labs  Lab 08/02/17 0313 08/03/17 0617 08/04/17 0500  HGB 11.1* 11.2* 9.8*  HCT 34.9* 35.3* 30.7*  WBC 14.2* 16.4* 19.4*  PLT 253 231 193    COAGULATION No results for input(s): INR in the last 168 hours.  CARDIAC   Recent Labs  Lab 07/31/17 1906 08/01/17 0145 08/01/17 0652 08/02/17 1849 08/03/17 0617  TROPONINI 0.04* 0.06* 0.06* 0.04* 0.04*   No results for input(s): PROBNP in the last 168 hours.   CHEMISTRY Recent Labs  Lab 08/01/17 0145 08/02/17 0313 08/02/17 1852 08/03/17 0617 08/04/17 0500  NA 142 143 146* 147* 141  K 4.2 4.2 4.4 4.6 6.1*  CL 109 110 107 107 101  CO2 24 28 29 31 28   GLUCOSE 113* 118* 128* 137* 127*  BUN 23 20 25* 29* 38*  CREATININE 0.50 0.39* 0.38* 0.63 0.58  CALCIUM 8.2* 8.4* 8.2* 8.2* 7.9*  MG  --   --   --  2.1 2.1  PHOS  --   --   --  <1.0* 2.2*    Estimated Creatinine Clearance: 56.9 mL/min (by C-G formula based on SCr of 0.58 mg/dL).   LIVER Recent Labs  Lab 08/01/17 0145 08/02/17 0313 08/02/17 1849 08/03/17 0617 08/04/17 0500  AST 110* 149* 204* 396* 1,869*  ALT 73* 84* 110* 159* 504*  ALKPHOS 406* 468* 490* 538* 580*  BILITOT 2.3* 2.2* 3.4* 3.6* 5.3*  PROT 5.0* 5.3* 5.2* 5.4* 5.4*  ALBUMIN 2.0* 2.0* 2.0* 2.0* 2.6*     INFECTIOUS Recent Labs  Lab 08/03/17 0617 08/03/17 1100 08/03/17 2051 08/04/17 0500  LATICACIDVEN 3.7*  --  3.9* 4.8*  PROCALCITON  --  6.32  --  7.53     ENDOCRINE CBG (last 3)  Recent Labs    08/03/17 2345 08/04/17 0304 08/04/17 0801  GLUCAP 114* 114* 134*         IMAGING x48h  - image(s) personally visualized  -   highlighted in bold Dg Chest 1 View  Result Date:  08/02/2017 CLINICAL DATA:  New onset of shortness of breath, history COPD, stroke, metastatic lung cancer, type II diabetes mellitus, smoker EXAM: CHEST  1 VIEW COMPARISON:  Portable exam 1716 hours compared to 08/01/2017 FINDINGS: Tip of nasogastric tube projects over proximal to mid stomach. Normal heart size, mediastinal contours, and pulmonary vascularity. Diffuse BILATERAL pulmonary infiltrates again identified, question pulmonary edema versus infection, significantly increased since 06/28/2017. Moderate RIGHT pleural effusion and mild RIGHT basilar atelectasis, appears slightly increased. No pneumothorax or acute osseous findings. IMPRESSION: Increased RIGHT pleural effusion and basilar atelectasis. Persistent diffuse interstitial infiltrates, question pulmonary edema versus atypical infection. Electronically Signed   By: Lavonia Dana M.D.   On: 08/02/2017 17:52   [place] Chest (pleural Effusion)  Result Date: 08/02/2017 CLINICAL DATA:  Evaluation of right pleural effusion prior to possible thoracentesis. EXAM: CHEST ULTRASOUND COMPARISON:  Chest x-ray on 08/01/2017 FINDINGS: Bedside ultrasound demonstrates a small volume  right pleural effusion with underlying atelectatic/consolidated right lower lung. Pleural fluid volume is too small to warrant thoracentesis. The liver is partially visualized and contains numerous hypoechoic lesions. IMPRESSION: 1. Small volume right pleural effusion which is too small to warrant thoracentesis currently. 2. Multiple liver lesions. Electronically Signed   By: Aletta Edouard M.D.   On: 08/02/2017 11:08   Dg Chest Port 1 View  Result Date: 08/03/2017 CLINICAL DATA:  Endotracheal tube. EXAM: PORTABLE CHEST 1 VIEW COMPARISON:  Radiograph August 02, 2017. FINDINGS: Stable cardiomediastinal silhouette. Endotracheal and nasogastric tubes are unchanged in position. No pneumothorax is noted. Interval placement of right-sided PICC line with distal tip in expected position of the SVC. Diffuse interstitial densities are noted throughout both lungs consistent with pulmonary edema or possibly inflammation. Moderate right pleural effusion is noted. Old right rib fracture is noted. IMPRESSION: Endotracheal and nasogastric tubes are unchanged in position. Interval placement of right-sided PICC line. Stable bilateral lung opacities are noted concerning for edema or possibly inflammation. Moderate right pleural effusion is noted. Electronically Signed   By: Marijo Conception, M.D.   On: 08/03/2017 07:11   Dg Chest Port 1 View  Result Date: 08/02/2017 CLINICAL DATA:  Endotracheal tube placement EXAM: PORTABLE CHEST 1 VIEW COMPARISON:  08/02/2017 FINDINGS: Endotracheal tube 2 cm above the carina.  NG tube in the stomach Severe diffuse bilateral airspace disease unchanged. Moderate right effusion unchanged. Right lower lobe atelectasis unchanged. IMPRESSION: Endotracheal tube in satisfactory position. No change in extensive diffuse bilateral airspace disease and right pleural effusion. Electronically Signed   By: Franchot Gallo M.D.   On: 08/02/2017 19:43   [place] Ekg Site Rite  Result Date: 08/02/2017 If Site Rite  image not attached, placement could not be confirmed due to current cardiac rhythm.    ASSESSMENT / PLAN:  discussion Progressive respiratory failure in setting of post-obstructive atx and now HCAP.Marland Kitchen Concerned about abd process as well--> ? Hematoma ? abscess ? SBP. She is in multi-organ failure. I am very concerned that her LFTs cont to rise. For today: hold weaning, cont abx, cont XRT, CT abd pelvis looking for abscess or hematoma. [place] abd look for ascites. Cont supportive care. I do not think that she will do well.   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 May  be a little better aerated c/w day prior. Does have some interstitial changes on left -PCT elevated Plan vanc day 2 & Cefepime day 2;  Cont full vent support Sputum culture Cont PAD protocol; RASS goal -2 Cont XRT Cont BDs  Severe sepsis 2/2 HCAP -BP stable -I am also concerned about possible GI source. Has sig abd pain, prob ascites, rising LFTs.  Plan CT abd/pelvis; r/o abscess abx per above Also will [place] abd looking for ascites  On-going tachycardia -? Sepsis ? Pain -etiology not clear Plan Cont tele Cont PRN BB abx workup as above  Fluid and Electrolyte imbalance & acid-base imbalance: hyperkalemia, rising lactic acid -not sure how to interpret lactic acid in this setting. Suspect just not clearing in setting of her hepatic dysfxn.    Plan Repeat chemistry NOW Dc all K replacement  May need Kayexalate   s/p nephrectomy (remote) baseline 0.7-0.9 -scr stable Plan Renal dose meds strick I&O Avoid hypotension  Transaminitis -->continiues to worsen ? Shock super imposed on liver mets?? Plan   CT abd/pelvis Will review meds w/  pharmacy  Acute on probable chronic pain.  -felt acutely cancer related.  Plan  Inadequate oral intake/protein calorie malnutrition Plan tubefeeds  Anemia of critical illness -hgb has dropped over night  Plan Cont LMWH Trend CBC Transfuse per protocol   Mild Hyperglycemia  Plan ssi     FAMILY  - Updates: 08/04/2017 --> family extensivey  - Inter-disciplinary family meet or Palliative Care meeting due by:  DAy 7. Current LOS is LOS 12 days   DISPO Keep in ICU    DVT prophylaxis: LMWH SUP: H2B Diet: Tubefeed Activity: BR Disposition : ICU  Erick Colace ACNP-BC [employer] Pager # [phone] [place] # [phone] if no answer

## 2017-08-04 NOTE — Progress Notes (Signed)
[facility] Progress Note Patient Name: Wanda James DOB: 12-05-1955 MRN: [id number]   Date of Service  08/04/2017  HPI/Events of Note  Lactate increased despite earlier albumin fluid bolus.  eICU Interventions  5 % Albumin 500 ml iv fluid bolus x 1        [place]wo U Ogan 08/04/2017, 6:47 AM

## 2017-08-04 NOTE — Progress Notes (Signed)
CCM MD notified of Lactic Acid Level at 4.8. Orders pending. Will continue to monitor patient.

## 2017-08-04 NOTE — Progress Notes (Signed)
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, pathologyconfirmed small cell cancer Mediastinal lymphadenopathy with occlusion of right lower lobe bronchus causing partial right lower lobe collapse MRIbrain isnegative for intracranial mets Radiation oncologist has been consulted; palliative radiation treatment to continue if possible after extensive discussion with patient and family members Overall, she is developing multiorgan failure, with signs of worsening renal function and liver failure Systemic chemotherapy would be challenging; in the presence of infection, unstable vital signs and progressive liver failure, that it is unlikely she will improve that the point that she can tolerate systemic chemotherapy Family members want to understand more about the disease process and questioned again why she is not a candidate for liver transplant and I tried to explain to them to the best of my ability I have discussed this with family members today They appears still optimistic that she will continue to improve with aggressive supportive measure We will continue aggressive supportive care  Elevated liver enzymesand mental status change, progressive liver failure Her liver enzymes are worse, could be due to disease process versus side effects of treatment Ammonia level is elevated Continue supportive care This is likely going to preclude multiple choices of chemotherapy I have spoken with pharmacist to review her medications again and see if her medications need to be adjusted  Chronic respiratory failure due to COPD, oxygen dependent Probable post-obstructive pneumonia Continue inhalers and oxygen along with antibiotics She is receiving aggressive oxygen supportand nebulizers  Moderate to severe protein calorie malnutrition, severe electrolyte imbalance Dysphagia, high risk of aspiration Due to  cancer Advancing diet through NG tube, on scheduled Reglan She is on electrolyte replacement  Hyperkalemia, worsening lactic acidosis, worsening BUN Even though her serum creatinine is not elevated, it could be underestimating her liver function due to minimal muscle mass I am concerned about worsening kidney failure We will monitor carefully I have discussed with pharmacist to review her medications again  Pathogenic E. Coli She is currently on broad-spectrum IV antibiotics  Tachycardia, possible PE She is on IV heparin  CODE STATUS After extensive discussion with the husband, he is in agreement to change her CODE STATUS to full code based on prior discussion  Goals of care discussion Due to her altered mental status, her husband is the dedicated healthcare power attorney I had another 45 minutes face to face discussion with family members; they would like to continue aggressive supportive care.  I have also discussed her case with nursing staff, pharmacist and primary service  Discharge planning Unlikely over the next few weeks  Spent approximately 1 hour on this case today Heath Lark, MD 08/04/2017  8:02 AM   Subjective:  Multiple family members are present.  The patient remained ventilated and sedated.  Objective:  Vitals:   08/04/17 0500 08/04/17 0600  BP: 122/81 128/85  Pulse: (!) 130 (!) 131  Resp: (!) 28 (!) 24  Temp:    SpO2: 94% 94%     Intake/Output Summary (Last 24 hours) at 08/04/2017 0802 Last data filed at 08/04/2017 0600 Gross per 24 hour  Intake 1827.18 ml  Output 575 ml  Net 1252.18 ml    GENERAL intubated, ventilated and sedated SKIN: She appears jaundiced LUNGS: Reduced breath sound on the right lung base HEART: Tachycardia, no murmurs ABDOMEN:abdomen soft,  mildly distended   Labs:  Lab Results  Component Value Date   WBC 19.4 (H)  08/04/2017   HGB 9.8 (L) 08/04/2017   HCT 30.7 (L) 08/04/2017   MCV 91.1 08/04/2017   PLT 193 08/04/2017    NEUTROABS 7.0 07/27/2017    Lab Results  Component Value Date   NA 141 08/04/2017   K 6.1 (H) 08/04/2017   CL 101 08/04/2017   CO2 28 08/04/2017    Studies:  Dg Chest 1 View  Result Date: 08/02/2017 CLINICAL DATA:  New onset of shortness of breath, history COPD, stroke, metastatic lung cancer, type II diabetes mellitus, smoker EXAM: CHEST  1 VIEW COMPARISON:  Portable exam 1716 hours compared to 08/01/2017 FINDINGS: Tip of nasogastric tube projects over proximal to mid stomach. Normal heart size, mediastinal contours, and pulmonary vascularity. Diffuse BILATERAL pulmonary infiltrates again identified, question pulmonary edema versus infection, significantly increased since 06/28/2017. Moderate RIGHT pleural effusion and mild RIGHT basilar atelectasis, appears slightly increased. No pneumothorax or acute osseous findings. IMPRESSION: Increased RIGHT pleural effusion and basilar atelectasis. Persistent diffuse interstitial infiltrates, question pulmonary edema versus atypical infection. Electronically Signed   By: Lavonia Dana M.D.   On: 08/02/2017 17:52   [place] Chest (pleural Effusion)  Result Date: 08/02/2017 CLINICAL DATA:  Evaluation of right pleural effusion prior to possible thoracentesis. EXAM: CHEST ULTRASOUND COMPARISON:  Chest x-ray on 08/01/2017 FINDINGS: Bedside ultrasound demonstrates a small volume right pleural effusion with underlying atelectatic/consolidated right lower lung. Pleural fluid volume is too small to warrant thoracentesis. The liver is partially visualized and contains numerous hypoechoic lesions. IMPRESSION: 1. Small volume right pleural effusion which is too small to warrant thoracentesis currently. 2. Multiple liver lesions. Electronically Signed   By: Aletta Edouard M.D.   On: 08/02/2017 11:08   Dg Chest Port 1 View  Result Date: 08/03/2017 CLINICAL DATA:  Endotracheal tube. EXAM: PORTABLE CHEST 1 VIEW COMPARISON:  Radiograph August 02, 2017. FINDINGS: Stable  cardiomediastinal silhouette. Endotracheal and nasogastric tubes are unchanged in position. No pneumothorax is noted. Interval placement of right-sided PICC line with distal tip in expected position of the SVC. Diffuse interstitial densities are noted throughout both lungs consistent with pulmonary edema or possibly inflammation. Moderate right pleural effusion is noted. Old right rib fracture is noted. IMPRESSION: Endotracheal and nasogastric tubes are unchanged in position. Interval placement of right-sided PICC line. Stable bilateral lung opacities are noted concerning for edema or possibly inflammation. Moderate right pleural effusion is noted. Electronically Signed   By: Marijo Conception, M.D.   On: 08/03/2017 07:11   Dg Chest Port 1 View  Result Date: 08/02/2017 CLINICAL DATA:  Endotracheal tube placement EXAM: PORTABLE CHEST 1 VIEW COMPARISON:  08/02/2017 FINDINGS: Endotracheal tube 2 cm above the carina.  NG tube in the stomach Severe diffuse bilateral airspace disease unchanged. Moderate right effusion unchanged. Right lower lobe atelectasis unchanged. IMPRESSION: Endotracheal tube in satisfactory position. No change in extensive diffuse bilateral airspace disease and right pleural effusion. Electronically Signed   By: Franchot Gallo M.D.   On: 08/02/2017 19:43   [place] Ekg Site Rite  Result Date: 08/02/2017 If Site Rite image not attached, placement could not be confirmed due to current cardiac rhythm.

## 2017-08-04 NOTE — Progress Notes (Addendum)
Call from RN  Has Sinus tach - Call from RN and worse    LABS done just now   PULMONARY Recent Labs  Lab 07/29/17 1037 07/31/17 1916 08/02/17 1819 08/02/17 2015  PHART 7.377 7.396 7.452* 7.388  PCO2ART 43.7 43.1 38.7 48.4*  PO2ART 146* 70.1* 45.4* 113*  HCO3 25.1 25.6 26.6 28.5*  O2SAT 98.4 92.2 82.2 95.8    CBC Recent Labs  Lab 08/02/17 0313 08/03/17 0617 08/04/17 0500  HGB 11.1* 11.2* 9.8*  HCT 34.9* 35.3* 30.7*  WBC 14.2* 16.4* 19.4*  PLT 253 231 193    COAGULATION Recent Labs  Lab 08/04/17 1400  INR 1.50    CARDIAC   Recent Labs  Lab 07/31/17 1906 08/01/17 0145 08/01/17 0652 08/02/17 1849 08/03/17 0617  TROPONINI 0.04* 0.06* 0.06* 0.04* 0.04*   No results for input(s): PROBNP in the last 168 hours.   CHEMISTRY Recent Labs  Lab 08/02/17 1852 08/03/17 0617 08/04/17 0500 08/04/17 1045 08/04/17 1400  NA 146* 147* 141 145 140  K 4.4 4.6 6.1* 6.4* 6.1*  CL 107 107 101 103 102  CO2 29 31 28 28 25   GLUCOSE 128* 137* 127* 128* 148*  BUN 25* 29* 38* 42* 42*  CREATININE 0.38* 0.63 0.58 0.51 0.62  CALCIUM 8.2* 8.2* 7.9* 8.3* 8.3*  MG  --  2.1 2.1  --   --   PHOS  --  <1.0* 2.2*  --   --    Estimated Creatinine Clearance: 56.9 mL/min (by C-G formula based on SCr of 0.62 mg/dL).   LIVER Recent Labs  Lab 08/01/17 0145 08/02/17 0313 08/02/17 1849 08/03/17 0617 08/04/17 0500 08/04/17 1400  AST 110* 149* 204* 396* 1,869*  --   ALT 73* 84* 110* 159* 504*  --   ALKPHOS 406* 468* 490* 538* 580*  --   BILITOT 2.3* 2.2* 3.4* 3.6* 5.3*  --   PROT 5.0* 5.3* 5.2* 5.4* 5.4*  --   ALBUMIN 2.0* 2.0* 2.0* 2.0* 2.6*  --   INR  --   --   --   --   --  1.50     INFECTIOUS Recent Labs  Lab 08/03/17 1100 08/03/17 2051 08/04/17 0500 08/04/17 1400  LATICACIDVEN  --  3.9* 4.8* 6.2*  PROCALCITON 6.32  --  7.53  --      ENDOCRINE CBG (last 3)  Recent Labs    08/04/17 0801 08/04/17 1133 08/04/17 1622  GLUCAP 134* 104* 132*          IMAGING x48h  - image(s) personally visualized  -   highlighted in bold Ct Abdomen Pelvis Wo Contrast  Result Date: 08/04/2017 CLINICAL DATA:  History of lung carcinoma with hepatic metastatic disease EXAM: CT ABDOMEN AND PELVIS WITHOUT CONTRAST TECHNIQUE: Multidetector CT imaging of the abdomen and pelvis was performed following the standard protocol without IV contrast. COMPARISON:  07/19/2017 FINDINGS: Lower chest: Bilateral pleural effusions are noted increased from the prior exam. Persistent right lower lobe consolidation is noted. Diffuse increased interstitial changes are noted likely related to underlying edema. The possibility of lymphangitic spread would deserve consideration although has appeared over 11 days likely more related to edema. Hepatobiliary: Multiple hypodense lesions are noted throughout the liver similar to that seen on prior exam consistent with metastatic disease. This is biopsy-proven. Pancreas: Unremarkable. No pancreatic ductal dilatation or surrounding inflammatory changes. Spleen: Normal in size without focal abnormality. Adrenals/Urinary Tract: Left kidney is been surgically removed. Right kidney demonstrates a tiny nonobstructing stone. The  adrenal gland on the right is within normal limits. The left adrenal gland is not well appreciated. Bladder is decompressed by Foley catheter. Stomach/Bowel: Postsurgical changes in the colon are noted. No obstructive changes are identified. Mild inflammatory change is noted surrounding although bowel loops although likely related to the underlying mild ascites. Vascular/Lymphatic: Aortic atherosclerosis. No enlarged abdominal or pelvic lymph nodes. Reproductive: Status post hysterectomy. No adnexal masses. Other: Mild ascites is noted as well as changes of mild anasarca. Musculoskeletal: No acute bony abnormality is noted. IMPRESSION: Right lower lobe collapse consistent with the known central obstructing lesion. Bilateral  pleural effusions. Increase in interstitial changes bilaterally likely related to edema given its acute nature. Changes consistent with diffuse hepatic metastatic disease. Mild ascites and anasarca. Electronically Signed   By: Inez Catalina M.D.   On: 08/04/2017 13:29   Dg Chest 1 View  Result Date: 08/02/2017 CLINICAL DATA:  New onset of shortness of breath, history COPD, stroke, metastatic lung cancer, type II diabetes mellitus, smoker EXAM: CHEST  1 VIEW COMPARISON:  Portable exam 1716 hours compared to 08/01/2017 FINDINGS: Tip of nasogastric tube projects over proximal to mid stomach. Normal heart size, mediastinal contours, and pulmonary vascularity. Diffuse BILATERAL pulmonary infiltrates again identified, question pulmonary edema versus infection, significantly increased since 06/28/2017. Moderate RIGHT pleural effusion and mild RIGHT basilar atelectasis, appears slightly increased. No pneumothorax or acute osseous findings. IMPRESSION: Increased RIGHT pleural effusion and basilar atelectasis. Persistent diffuse interstitial infiltrates, question pulmonary edema versus atypical infection. Electronically Signed   By: Lavonia Dana M.D.   On: 08/02/2017 17:52   Dg Chest Port 1 View  Result Date: 08/04/2017 CLINICAL DATA:  Follow-up pneumonia. EXAM: PORTABLE CHEST 1 VIEW COMPARISON:  08/04/2017. FINDINGS: Endotracheal tube, NG tube, right PICC line in stable position. Heart size stable. Diffuse bilateral pulmonary interstitial prominence and right-sided pleural effusion again noted. IMPRESSION: 1.  Lines and tubes in stable position. 2. Diffuse bilateral interstitial prominence and prominent right-sided pleural effusion. No interim change from prior exam. Electronically Signed   By: Marcello Moores  Register   On: 08/04/2017 11:33   Dg Chest Port 1 View  Result Date: 08/04/2017 CLINICAL DATA:  Endotracheal tube placement EXAM: PORTABLE CHEST 1 VIEW COMPARISON:  08/03/2017 FINDINGS: Endotracheal tube is noted 1 cm  above the carina stable from the previous exam. Nasogastric catheter extends into the stomach. Right-sided PICC line is noted with the catheter tip in the distal superior vena cava. Stable right pleural effusion and vascular congestion are seen. The overall appearance is stable from the previous day. IMPRESSION: No significant change when compare with the prior day. Electronically Signed   By: Inez Catalina M.D.   On: 08/04/2017 08:52   Dg Chest Port 1 View  Result Date: 08/03/2017 CLINICAL DATA:  Endotracheal tube. EXAM: PORTABLE CHEST 1 VIEW COMPARISON:  Radiograph August 02, 2017. FINDINGS: Stable cardiomediastinal silhouette. Endotracheal and nasogastric tubes are unchanged in position. No pneumothorax is noted. Interval placement of right-sided PICC line with distal tip in expected position of the SVC. Diffuse interstitial densities are noted throughout both lungs consistent with pulmonary edema or possibly inflammation. Moderate right pleural effusion is noted. Old right rib fracture is noted. IMPRESSION: Endotracheal and nasogastric tubes are unchanged in position. Interval placement of right-sided PICC line. Stable bilateral lung opacities are noted concerning for edema or possibly inflammation. Moderate right pleural effusion is noted. Electronically Signed   By: Marijo Conception, M.D.   On: 08/03/2017 07:11   Dg Chest [facility]  1 View  Result Date: 08/02/2017 CLINICAL DATA:  Endotracheal tube placement EXAM: PORTABLE CHEST 1 VIEW COMPARISON:  08/02/2017 FINDINGS: Endotracheal tube 2 cm above the carina.  NG tube in the stomach Severe diffuse bilateral airspace disease unchanged. Moderate right effusion unchanged. Right lower lobe atelectasis unchanged. IMPRESSION: Endotracheal tube in satisfactory position. No change in extensive diffuse bilateral airspace disease and right pleural effusion. Electronically Signed   By: Franchot Gallo M.D.   On: 08/02/2017 19:43   [place] Ekg Site Rite  Result Date:  08/02/2017 If Site Rite image not attached, placement could not be confirmed due to current cardiac rhythm.   A) No evidence of RP bleed or liver abscess. Has liver mets B) shock liver, and  Worsening lactic acidosis  C) persistetn hyperkalemia  Suspect worsning metablic acidosis from tumor burden and liver mets Awaiting repeat CBC and LFT  P Await repeat cbc and lft Check ABG chek 12 lead ekg -> sinus tach Start bic gtt Start cardizem gtt  Prognosis grim- need to update family       Addendu.m 5:50 PM 08/04/2017   d/w husband, son, daughter, grandkids - explained worsening. Did IDT discussion with RN at bedside PRognosis is few to several days. REcomended DNR but full medical care at this point. They are deliberating and will update [place] "tomorrow"   Dr. Brand Males, M.D., [facility].C.P Pulmonary and Critical Care Medicine Staff Physician, [place] Director - Interstitial Lung Disease  Program  Pulmonary [place] [facility] [place], [place], [address]  Pager: [phone], If no answer or between  15:00h - 7:00h: call [phone] Telephone: [phone]

## 2017-08-04 DEATH — deceased

## 2017-08-05 ENCOUNTER — Ambulatory Visit: Payer: BLUE CROSS/BLUE SHIELD

## 2017-08-05 DIAGNOSIS — R579 Shock, unspecified: Secondary | ICD-10-CM

## 2017-08-05 DIAGNOSIS — E872 Acidosis, unspecified: Secondary | ICD-10-CM

## 2017-08-05 LAB — COMPREHENSIVE METABOLIC PANEL
ALT: 1025 U/L — ABNORMAL HIGH (ref 0–44)
ANION GAP: 16 — AB (ref 5–15)
AST: 4083 U/L — AB (ref 15–41)
Albumin: 2.6 g/dL — ABNORMAL LOW (ref 3.5–5.0)
Alkaline Phosphatase: 827 U/L — ABNORMAL HIGH (ref 38–126)
BILIRUBIN TOTAL: 6.6 mg/dL — AB (ref 0.3–1.2)
BUN: 51 mg/dL — AB (ref 8–23)
CHLORIDE: 102 mmol/L (ref 98–111)
CO2: 26 mmol/L (ref 22–32)
Calcium: 8 mg/dL — ABNORMAL LOW (ref 8.9–10.3)
Creatinine, Ser: 0.96 mg/dL (ref 0.44–1.00)
GFR calc Af Amer: >60 mL/min (ref >60)
GFR calc non Af Amer: >60 mL/min (ref >60)
Glucose, Bld: 198 mg/dL — ABNORMAL HIGH (ref 70–99)
POTASSIUM: 6.8 mmol/L — AB (ref 3.5–5.1)
Sodium: 144 mmol/L (ref 135–145)
TOTAL PROTEIN: 5.2 g/dL — AB (ref 6.5–8.1)

## 2017-08-05 LAB — GLUCOSE, CAPILLARY
GLUCOSE-CAPILLARY: 177 mg/dL — AB (ref 70–99)
GLUCOSE-CAPILLARY: 169 mg/dL — AB (ref 70–99)
Glucose-Capillary: 190 mg/dL — ABNORMAL HIGH (ref 70–99)
Glucose-Capillary: 221 mg/dL — ABNORMAL HIGH (ref 70–99)
Glucose-Capillary: 151 mg/dL — ABNORMAL HIGH (ref 70–99)
Glucose-Capillary: 116 mg/dL — ABNORMAL HIGH (ref 70–99)

## 2017-08-05 LAB — PROTIME-INR
INR: 1.76
PROTHROMBIN TIME: 20.4 s — AB (ref 11.4–15.2)

## 2017-08-05 LAB — PHOSPHORUS: PHOSPHORUS: 3.8 mg/dL (ref 2.5–4.6)

## 2017-08-05 LAB — CBC
HCT: 28.5 % — ABNORMAL LOW (ref 36.0–46.0)
HEMOGLOBIN: 9.1 g/dL — AB (ref 12.0–15.0)
MCH: 29.4 pg (ref 26.0–34.0)
MCHC: 31.9 g/dL (ref 30.0–36.0)
MCV: 91.9 fL (ref 78.0–100.0)
PLATELETS: 178 10*3/uL (ref 150–400)
RBC: 3.1 MIL/uL — AB (ref 3.87–5.11)
RDW: 17.9 % — ABNORMAL HIGH (ref 11.5–15.5)
WBC: 19 10*3/uL — ABNORMAL HIGH (ref 4.0–10.5)

## 2017-08-05 LAB — PROCALCITONIN: PROCALCITONIN: 8.81 ng/mL

## 2017-08-05 LAB — LACTIC ACID, PLASMA: LACTIC ACID, VENOUS: 8.3 mmol/L — AB (ref 0.5–1.9)

## 2017-08-05 LAB — MAGNESIUM: MAGNESIUM: 2.6 mg/dL — AB (ref 1.7–2.4)

## 2017-08-05 MED ORDER — DEXTROSE 50 % IV SOLN
1.0000 | Freq: Once | INTRAVENOUS | Status: AC
  Administered 2017-08-05: 50 mL via INTRAVENOUS
  Filled 2017-08-05: qty 50

## 2017-08-05 MED ORDER — MIDAZOLAM HCL 5 MG/ML IJ SOLN: 1.0000 mg | INTRAMUSCULAR | Status: DC | PRN

## 2017-08-05 MED ORDER — SODIUM CHLORIDE 0.9 % IV SOLN
1.0000 g | Freq: Once | INTRAVENOUS | Status: AC
  Administered 2017-08-05: 1 g via INTRAVENOUS
  Filled 2017-08-05: qty 10

## 2017-08-05 MED ORDER — INSULIN REGULAR BOLUS VIA INFUSION
3.0000 [IU] | Freq: Once | INTRAVENOUS | Status: DC
  Filled 2017-08-05: qty 3

## 2017-08-05 MED ORDER — SODIUM POLYSTYRENE SULFONATE 15 GM/60ML PO SUSP
30.0000 g | Freq: Three times a day (TID) | ORAL | Status: DC | PRN
  Administered 2017-08-05: 30 g
  Filled 2017-08-05: qty 120

## 2017-08-05 NOTE — Progress Notes (Addendum)
Continued attempt to contact [employer] until 4:00.  Unable to contact due to no answer.  We were informed yesterday that superintendent is present from 8-4.  They have received fax detailing pt condition and family request.       Provided continued care with family at bedside and in ICU waiting.  They are supported by [employer], who has been visiting hospital as well.    Weekend chaplain team is informed of need.    As needs arise, please page 24 hour oncall [phone] for chaplain support for this family.

## 2017-08-05 NOTE — Progress Notes (Addendum)
Spoke with Network engineer of [employer], who confirmed that [facility] had received fax from [facility].    Awaiting call back.   Reported to family and provided support at bedside.  Pt's daughter feels that Ms Kellenberger is waiting for Jeneen Rinks to come to hospital.

## 2017-08-05 NOTE — Progress Notes (Signed)
Chaplain is actively working with correctional facility to assist/determine if the family member can see the patient.

## 2017-08-05 NOTE — Progress Notes (Signed)
Palliative care progress note Reason for consult: Goals of care, psychosocial support, symptom management  Discussed events of the day with bedside RN.  I met at bedside with patient's husband and grandchildren.  Plan for continuation of current therapies with no escalation of care as she declines.  Family has been working to try to get son who is incarcerated the opportunity to visit with her.  Appreciate chaplain support in this.   On exam, Patient is unresponsive on vent.  No signs of acute distress.  Family denies needs other than "prayer" when asked.  Symptoms appear well managed.   Will continue to follow and support family through difficult transition.  Total time: 15 minutes Greater than 50%  of this time was spent counseling and coordinating care related to the above assessment and plan.   Micheline Rough, MD [place] Team [phone]

## 2017-08-05 NOTE — Progress Notes (Addendum)
Chaplain Providing support with this family around end of life.    Family is working to arrange patient's son visiting hospital.  Worked closely with pt's daughter, Marina Goodell.   Son, Lovena Le, is incarcerated at [facility].  With family's permission, chaplain assisted in process.  Contacted [facility] and spoke with Insurance claims handler in [place].   [facility] informed that superintendent Kathrin Penner would need to make decision about whether Jeneen Rinks was able to travel.  Superintendent was not present until AM.  Will call back in morning and continue to assist in arranging travel for son.    [facility] informed that [place] would need documentation from [facility] as to mother's medical condition.  Will secure fax number for this documentation from [place] in AM.    Reported to family and provided support around process and family coping.     [facility] / [facility] Chaplain Jerene Pitch, MDiv, [facility]

## 2017-08-05 NOTE — Progress Notes (Signed)
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  Due to progressive decline in overall health, family members have made decision to discontinue radiation treatment.  I will contact radiation oncology department The patient's family has accepted that she is not a chemotherapy candidate They would like her to proceed with full supportive measures if possible  Elevated liver enzymesand mental status change, progressive liver failure Her liver enzymes are worse, could be due to disease process Ammonia level is elevated Continue supportive care  Coagulopathy Her INR is elevated, indicative of synthetic liver dysfunction secondary to liver failure Due to high risk of bleeding, I have discontinued Lovenox We discussed the risk and benefits of transfusion support in the event that she started to have bleeding complications After much discussion, they have agreed not to transfuse her if she starts to have bleeding  Chronic respiratory failure due to COPD, oxygen dependent Probable post-obstructive pneumonia She has been on IV antibiotics since admission to the hospital She has been afebrile over the last 24 hours We also discussed chronic broad-spectrum IV antibiotics can cause thrombocytopenia that could increase risk of bleeding and other undesirable side effects such as risk of C. difficile After a lot of discussion about risk and benefits of IV antibiotic treatment, we are in agreement to discontinue IV antibiotics for now If she developed fever again, recommend conservative measures with cooling blankets Continue supportive oxygen therapy  Moderate to severe protein calorie malnutrition,severe electrolyte  imbalance Dysphagia, high risk of aspiration Due to cancer Family members would like her to continue on nutritional supplement through NG feeding and electrolyte replacement therapy as needed  Hyperkalemia, worsening lactic acidosis, worsening BUN, signs of worsening renal function Even though her serum creatinine is not elevated, it could be underestimating her liver function due to minimal muscle mass I am concerned about worsening kidney failure We will monitor carefully I have explained to family members that the high potassium seen today is not related to potassium phosphorus that she received several days ago Kayexalate has been prescribed We discussed potential risk of hemodialysis if she has worsening renal failure  After extensive discussion, they would not want her to go on hemodialysis  Tachycardia, possible PE She is on IV cardizem We discussed the risk and benefits of CPR/pressor support if needed For now, family members have made informed decision not to escalate cardiovascular support with either CPR or pressor support  CODE STATUS After extensive discussion with the family, he is in agreement tochange her CODE STATUS to DNR He wants the patient to remain intubated and to receive full oxygen support He wants to keep her sedated I have informed nursing staff NOT to perform daily assessment for weaning effort  Goals of care discussion Due to her altered mental status, her husband, Wanda James is the dedicated healthcare power attorney I had another 45 minutes face to face discussion with family members; they would like to continue aggressive supportive care The plan is to get her incarcerated son to visit her before she succumb to her disease They understood there is a possibility that he may not make it on time  We had extensive discussion about different scenarios that might happen in the events that she developed other forms of organ  failure For now, they want to keep  everything as is without escalation of treatment  Discharge planning Anticipate hospital death within the next few hours to days I have updated her nurse and primary service Please call if questions arise  Total time spent on this case is an hour, more than 45 minutes face-to-face discussion with family  Heath Lark, MD 08/05/2017  8:01 AM   Subjective:  The patient is sedated and ventilated.  Noted Cardizem was started due to worsening tachycardia.  CT scan of the abdomen is reviewed.  The patient continued on the trajectory of progressive decline.  Objective:  Vitals:   08/05/17 0736 08/05/17 0750  BP:    Pulse:    Resp:    Temp:    SpO2: 90% 90%     Intake/Output Summary (Last 24 hours) at 08/05/2017 0801 Last data filed at 08/05/2017 0600 Gross per 24 hour  Intake 2103.62 ml  Output 550 ml  Net 1553.62 ml    GENERAL: Intubated, ventilated and sedated SKIN: She is clinically jaundiced ABDOMEN:abdomen soft and distended Labs:  Lab Results  Component Value Date   WBC 19.0 (H) 08/05/2017   HGB 9.1 (L) 08/05/2017   HCT 28.5 (L) 08/05/2017   MCV 91.9 08/05/2017   PLT 178 08/05/2017   NEUTROABS 17.9 08/04/2017    Lab Results  Component Value Date   NA 144 08/05/2017   K 6.8 (HH) 08/05/2017   CL 102 08/05/2017   CO2 26 08/05/2017    Studies:  Ct Abdomen Pelvis Wo Contrast  Result Date: 08/04/2017 CLINICAL DATA:  History of lung carcinoma with hepatic metastatic disease EXAM: CT ABDOMEN AND PELVIS WITHOUT CONTRAST TECHNIQUE: Multidetector CT imaging of the abdomen and pelvis was performed following the standard protocol without IV contrast. COMPARISON:  07/16/2017 FINDINGS: Lower chest: Bilateral pleural effusions are noted increased from the prior exam. Persistent right lower lobe consolidation is noted. Diffuse increased interstitial changes are noted likely related to underlying edema. The possibility of lymphangitic spread would deserve consideration although has  appeared over 11 days likely more related to edema. Hepatobiliary: Multiple hypodense lesions are noted throughout the liver similar to that seen on prior exam consistent with metastatic disease. This is biopsy-proven. Pancreas: Unremarkable. No pancreatic ductal dilatation or surrounding inflammatory changes. Spleen: Normal in size without focal abnormality. Adrenals/Urinary Tract: Left kidney is been surgically removed. Right kidney demonstrates a tiny nonobstructing stone. The adrenal gland on the right is within normal limits. The left adrenal gland is not well appreciated. Bladder is decompressed by Foley catheter. Stomach/Bowel: Postsurgical changes in the colon are noted. No obstructive changes are identified. Mild inflammatory change is noted surrounding although bowel loops although likely related to the underlying mild ascites. Vascular/Lymphatic: Aortic atherosclerosis. No enlarged abdominal or pelvic lymph nodes. Reproductive: Status post hysterectomy. No adnexal masses. Other: Mild ascites is noted as well as changes of mild anasarca. Musculoskeletal: No acute bony abnormality is noted. IMPRESSION: Right lower lobe collapse consistent with the known central obstructing lesion. Bilateral pleural effusions. Increase in interstitial changes bilaterally likely related to edema given its acute nature. Changes consistent with diffuse hepatic metastatic disease. Mild ascites and anasarca. Electronically Signed   By: Inez Catalina M.D.   On: 08/04/2017 13:29   Dg Chest Port 1 View  Result Date: 08/04/2017 CLINICAL DATA:  Follow-up pneumonia. EXAM: PORTABLE CHEST 1 VIEW COMPARISON:  08/04/2017. FINDINGS: Endotracheal tube, NG tube, right PICC line in stable position. Heart size stable. Diffuse bilateral pulmonary interstitial  prominence and right-sided pleural effusion again noted. IMPRESSION: 1.  Lines and tubes in stable position. 2. Diffuse bilateral interstitial prominence and prominent right-sided pleural  effusion. No interim change from prior exam. Electronically Signed   By: Marcello Moores  Register   On: 08/04/2017 11:33   Dg Chest Port 1 View  Result Date: 08/04/2017 CLINICAL DATA:  Endotracheal tube placement EXAM: PORTABLE CHEST 1 VIEW COMPARISON:  08/03/2017 FINDINGS: Endotracheal tube is noted 1 cm above the carina stable from the previous exam. Nasogastric catheter extends into the stomach. Right-sided PICC line is noted with the catheter tip in the distal superior vena cava. Stable right pleural effusion and vascular congestion are seen. The overall appearance is stable from the previous day. IMPRESSION: No significant change when compare with the prior day. Electronically Signed   By: Inez Catalina M.D.   On: 08/04/2017 08:52

## 2017-08-05 NOTE — Progress Notes (Signed)
Name: Wanda James MRN: [id number] DOB: 06/20/1955    ADMISSION DATE:  08/03/2017 CONSULTATION DATE:  7/21  REFERRING MD :  [place] Crane  CHIEF COMPLAINT:  Abnormal CT chest   BRIEF PATIENT DESCRIPTION/HPI    brief This is a 62 year old female patient with a significant history of renal cell carcinoma status post prior left nephrectomy, tobacco abuse, prob COPD with oxygen dependence since recent admit for PNA at end of June. Presented to the emergency room with chief complaint of: Approximately 1 week history of worsening cough initially productive of sputum with black spots, shortness of breath, decreased p.o. appetite with shortness of breath particularly worse over the prior 2 days.    Recently discharged on the hospital in 6/23 2019 where she was admitted for acute acute exacerbation COPD and also pneumonia she was discharged home with prednisone taper, oxygen, and Levaquin. Review of imaging during that hospitalization did show right lower lobe airspace disease, this appeared to improve some on serial films prior to discharge.  She was seen once again by her primary care provider on 7/17 for worsening of symptoms at that time she was started on Biaxin, her symptoms have not improved and therefore she presented to the emergency room.   On presentation this time a CT of chest was obtained this demonstrated: Partial right lower lobe collapse/atelectasis with displacement of the right fissure, mediastinal adenopathy, subtle hypodense masslike abnormality of the liver.  She was admitted with a working diagnosis of recurrent pneumonia with possible lung mass.  Pulmonary has been asked to evaluate in regards to CT findings.    has a past medical history of Acid reflux, Arthritis, Cancer ([place]), Cataract, Chiari malformation type I ([place]), Complication of anesthesia, COPD (chronic obstructive pulmonary disease) ([place]), Diverticulitis, Headache, Pneumonia, Stroke ([place]), and Subclavian artery stenosis,  left ([place]).   has a past surgical history that includes carotidendarterectomy; Nephrectomy; Abdominal hysterectomy; Inner ear surgery; Eye surgery; Tonsillectomy; Colon surgery; Partial colectomy; Tubal ligation; Breast surgery; Rhinoplasty; and Lumbar laminectomy/decompression microdiscectomy (N/A, 08/19/2016).   EVENTS CT chest 7/21: Partial right lower lobe collapse/atelectasis with displacement of the right major fissure and caudal displacement of the right hilum.  There is also mediastinal lymphadenopathy and a subtle hypodensity masslike area in the liver   7/21 - pccm consult  7/22 - liver biopsy recommended and ccm signed off  7/23 - s/p liver biopsy  (MRI [place] negative for mets) - small lung - suggestive of primary  lung cancer  7/28 - intermediate prob for VQ  7/29 - pccm recalled. Per family - 2L [place] x 1 month. Then 5LNC few days and overnigth 15LNC. VQ scan intermediate prob technically. 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 No calf swelling. Cannot get IV dye due to allergy. Maintains BP.  Has needed morphine for cancer related pain. Per RN -  Has refused XRT x 2 days due to nause and being "tired and dont know" per   7/30 - code status reversed to full code. On IV heparin empiric (duplex neg for dvt) based on intermediate prob vq and worsening hypoxemia. O2 needs now improved to 7L [place]. Husband at bedside - feels she is some better. Reports moaning due to abd pain every 3h and getting morphine. Denies any confusion. Goal is palliative  XRT for mediastinal adenopathy and RLL collapse while in ICU with full code and if/when ECOG improves.   7/31 - intubated last night due to worsening hypoxemia and distress despite IV heparin (  DVT negative and ECHO no RV strian). That am- sedated on fent gtt. Phos being repleted. Maintains bp/hr/ur op. XRT pending later today.  Family at bedside.  T max 103 with rising wbc and lactic > 3  SUBJECTIVE/OVERNIGHT/INTERVAL HX Pt with  progressive decline , increased o2 demands , hypotension .  Family discussion , pt with sign decline , XRT discontinued, PT DNR . , no transfusions if bleeding , no ABX , no HD , supportive care  Family at bedside , support provided.  Letter request for pt son to come from correctional facility to visit - letter completed    VITAL SIGNS: Temp:  [98.2 F (36.8 C)-100.6 F (38.1 C)] 98.2 F (36.8 C) (08/02 0750) Pulse Rate:  [108-143] 109 (08/02 0700) Resp:  [16-41] 19 (08/02 0700) BP: (86-142)/(38-88) 93/38 (08/02 0700) SpO2:  [86 %-98 %] 86 % (08/02 0900) FiO2 (%):  [40 %-100 %] 70 % (08/02 0932) Weight:  [132 lb 4.4 oz (60 kg)] 132 lb 4.4 oz (60 kg) (08/02 0454)  Intake/Output Summary (Last 24 hours) at 08/05/2017 0950 Last data filed at 08/05/2017 0600 Gross per 24 hour  Intake 2103.62 ml  Output 550 ml  Net 1553.62 ml    PHYSICAL EXAMINATION: GEN: 62 year old female sedated on the vent HEENT, NCAT, orally intubated, no JVD, dry mucosa Pulmonary diffuse rhonchi Cardiac tachycardia, RRR Abdomen distended decreased bowel sounds Neuro, sedated   PULMONARY Recent Labs  Lab 07/29/17 1037 07/31/17 1916 08/02/17 1819 08/02/17 2015 08/04/17 1715  PHART 7.377 7.396 7.452* 7.388 7.400  PCO2ART 43.7 43.1 38.7 48.4* 41.4  PO2ART 146* 70.1* 45.4* 113* 71.4*  HCO3 25.1 25.6 26.6 28.5* 24.8  O2SAT 98.4 92.2 82.2 95.8 91.7    CBC Recent Labs  Lab 08/04/17 0500 08/04/17 1400 08/05/17 0200  HGB 9.8* 9.2* 9.1*  HCT 30.7* 29.3* 28.5*  WBC 19.4* 19.3* 19.0*  PLT 193 181 178    COAGULATION Recent Labs  Lab 08/04/17 1400 08/05/17 0200  INR 1.50 1.76    CARDIAC   Recent Labs  Lab 07/31/17 1906 08/01/17 0145 08/01/17 0652 08/02/17 1849 08/03/17 0617  TROPONINI 0.04* 0.06* 0.06* 0.04* 0.04*   No results for input(s): PROBNP in the last 168 hours.   CHEMISTRY Recent Labs  Lab 08/03/17 0617 08/04/17 0500 08/04/17 1045 08/04/17 1400 08/05/17 0200  NA 147*  141 145 140 144  K 4.6 6.1* 6.4* 6.1* 6.8*  CL 107 101 103 102 102  CO2 31 28 28 25 26   GLUCOSE 137* 127* 128* 148* 198*  BUN 29* 38* 42* 42* 51*  CREATININE 0.63 0.58 0.51 0.62 0.96  CALCIUM 8.2* 7.9* 8.3* 8.3* 8.0*  MG 2.1 2.1  --   --  2.6*  PHOS <1.0* 2.2*  --   --  3.8   Estimated Creatinine Clearance: 47.4 mL/min (by C-G formula based on SCr of 0.96 mg/dL).   LIVER Recent Labs  Lab 08/02/17 0313 08/02/17 1849 08/03/17 0617 08/04/17 0500 08/04/17 1400 08/05/17 0200  AST 149* 204* 396* 1,869*  --  4,083*  ALT 84* 110* 159* 504*  --  1,025*  ALKPHOS 468* 490* 538* 580*  --  827*  BILITOT 2.2* 3.4* 3.6* 5.3*  --  6.6*  PROT 5.3* 5.2* 5.4* 5.4*  --  5.2*  ALBUMIN 2.0* 2.0* 2.0* 2.6*  --  2.6*  INR  --   --   --   --  1.50 1.76     INFECTIOUS Recent Labs  Lab 08/03/17 1100  08/04/17 0500 08/04/17 1400 08/05/17 0200 08/05/17 0626  LATICACIDVEN  --    < > 4.8* 6.2*  --  8.3*  PROCALCITON 6.32  --  7.53  --  8.81  --    < > = values in this interval not displayed.     ENDOCRINE CBG (last 3)  Recent Labs    08/04/17 2338 08/05/17 0330 08/05/17 0812  GLUCAP 205* 190* 221*         IMAGING x48h  - image(s) personally visualized  -   highlighted in bold Ct Abdomen Pelvis Wo Contrast  Result Date: 08/04/2017 CLINICAL DATA:  History of lung carcinoma with hepatic metastatic disease EXAM: CT ABDOMEN AND PELVIS WITHOUT CONTRAST TECHNIQUE: Multidetector CT imaging of the abdomen and pelvis was performed following the standard protocol without IV contrast. COMPARISON:  07/14/2017 FINDINGS: Lower chest: Bilateral pleural effusions are noted increased from the prior exam. Persistent right lower lobe consolidation is noted. Diffuse increased interstitial changes are noted likely related to underlying edema. The possibility of lymphangitic spread would deserve consideration although has appeared over 11 days likely more related to edema. Hepatobiliary: Multiple hypodense  lesions are noted throughout the liver similar to that seen on prior exam consistent with metastatic disease. This is biopsy-proven. Pancreas: Unremarkable. No pancreatic ductal dilatation or surrounding inflammatory changes. Spleen: Normal in size without focal abnormality. Adrenals/Urinary Tract: Left kidney is been surgically removed. Right kidney demonstrates a tiny nonobstructing stone. The adrenal gland on the right is within normal limits. The left adrenal gland is not well appreciated. Bladder is decompressed by Foley catheter. Stomach/Bowel: Postsurgical changes in the colon are noted. No obstructive changes are identified. Mild inflammatory change is noted surrounding although bowel loops although likely related to the underlying mild ascites. Vascular/Lymphatic: Aortic atherosclerosis. No enlarged abdominal or pelvic lymph nodes. Reproductive: Status post hysterectomy. No adnexal masses. Other: Mild ascites is noted as well as changes of mild anasarca. Musculoskeletal: No acute bony abnormality is noted. IMPRESSION: Right lower lobe collapse consistent with the known central obstructing lesion. Bilateral pleural effusions. Increase in interstitial changes bilaterally likely related to edema given its acute nature. Changes consistent with diffuse hepatic metastatic disease. Mild ascites and anasarca. Electronically Signed   By: Inez Catalina M.D.   On: 08/04/2017 13:29   Dg Chest Port 1 View  Result Date: 08/04/2017 CLINICAL DATA:  Follow-up pneumonia. EXAM: PORTABLE CHEST 1 VIEW COMPARISON:  08/04/2017. FINDINGS: Endotracheal tube, NG tube, right PICC line in stable position. Heart size stable. Diffuse bilateral pulmonary interstitial prominence and right-sided pleural effusion again noted. IMPRESSION: 1.  Lines and tubes in stable position. 2. Diffuse bilateral interstitial prominence and prominent right-sided pleural effusion. No interim change from prior exam. Electronically Signed   By: Marcello Moores   Register   On: 08/04/2017 11:33   Dg Chest Port 1 View  Result Date: 08/04/2017 CLINICAL DATA:  Endotracheal tube placement EXAM: PORTABLE CHEST 1 VIEW COMPARISON:  08/03/2017 FINDINGS: Endotracheal tube is noted 1 cm above the carina stable from the previous exam. Nasogastric catheter extends into the stomach. Right-sided PICC line is noted with the catheter tip in the distal superior vena cava. Stable right pleural effusion and vascular congestion are seen. The overall appearance is stable from the previous day. IMPRESSION: No significant change when compare with the prior day. Electronically Signed   By: Inez Catalina M.D.   On: 08/04/2017 08:52     ASSESSMENT / PLAN:  discussion Progressive respiratory failure in setting of post-obstruction from  mediastinal lymphadenopathy with occlusion of the right lower lobe bronchus causing partial right lower lobe collapse.  Family discussion with oncology patient is discontinue on radiation therapy, abx, cont supportive care .   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    Severe sepsis 2/2 HCAP   Plan supportive care   Tachycardia  Hypotension  Plan  Cardizem off d/t hypotension       Fluid and Electrolyte imbalance & acid-base imbalance: hyperkalemia, rising lactic acid -not sure how to interpret lactic acid in this setting. Suspect just not clearing in setting of her hepatic dysfxn.    Plan Hold lab draw   s/p nephrectomy (remote) baseline 0.7-0.9 -scr stable Plan Renal dose meds strick I&O Avoid hypotension  Transaminitis -->continiues to worsen ? Shock super imposed on liver mets?? Plan   Hold on lab draw  Acute on probable chronic pain.  -felt acutely cancer related.   Plan Fentanyl  D/c lexapro   Inadequate oral intake/protein calorie malnutrition Plan Cont tubefeeds  Anemia of critical illness -hgb has dropped over night  Plan LMWH d/c 8/2  No lab draws at this time  No plan for transfusion going forward   Mild Hyperglycemia  Plan ssi     FAMILY  - Updates: 08/05/2017 --> family extensive , updated at bedside  See oncology discussion note, pt DNR . Supportive care   - Inter-disciplinary family meet or Palliative Care meeting due by:  DAy 7. Current LOS is LOS 13 days   DISPO Keep in ICU    DVT prophylaxis: LMWH d/c 8/2  SUP: H2B Diet: Tubefeed Activity: BR Disposition : ICU   Tammy Parrett NP-C  [facility]  [phone]   08/05/2017

## 2017-08-05 NOTE — Progress Notes (Signed)
Elink MD aware of patient's potassium level at 6.8. New medication orders placed and administered. Will continue to monitor patient closely.

## 2017-08-05 NOTE — Progress Notes (Addendum)
Chaplain attempting to contact [place] at [facility] to assist pt's son, Lovena Le visiting.  Thus far, have not been able to speak with superintendent at [place].  Will continue to attempt to contact.     [facility] Chaplain Jerene Pitch, MDiv, [facility]

## 2017-08-05 NOTE — Progress Notes (Signed)
[place] Progress Note Patient Name: Wanda James DOB: 12/26/1955 MRN: [id number]   Date of Service  08/05/2017  HPI/Events of Note  Hyperkalemia  eICU Interventions  Calcium gluconate 1 gm iv now, D 50 W 1 amp iv x 1, Reguular insulin 3 units sq with D 50 W 1 amp, Kayaxelate 30 gm via NG tube Q 8 hrs until K+ < 5.0        Netra Postlethwait U Terree Gaultney 08/05/2017, 5:37 AM

## 2017-08-05 NOTE — Progress Notes (Addendum)
Continuing to support family   Obtained fax number for Superintendent Kathrin Penner at [facility]  With family's permission, faxed visit request for pt's son Lovena Le.   Included Chaplain request, Critical care documentation.   Copy of Fax in pt Chart.    Will continue to attempt to contact superintendent via phone.     [facility] Chaplain Jerene Pitch, MDiv, [facility]

## 2017-08-06 DIAGNOSIS — Z515 Encounter for palliative care: Secondary | ICD-10-CM

## 2017-08-06 DIAGNOSIS — K7200 Acute and subacute hepatic failure without coma: Secondary | ICD-10-CM

## 2017-08-06 DIAGNOSIS — Z66 Do not resuscitate: Secondary | ICD-10-CM | POA: Diagnosis not present

## 2017-08-06 DIAGNOSIS — N179 Acute kidney failure, unspecified: Secondary | ICD-10-CM

## 2017-08-06 LAB — COMPREHENSIVE METABOLIC PANEL
ALBUMIN: 2.2 g/dL — AB (ref 3.5–5.0)
ALK PHOS: 1204 U/L — AB (ref 38–126)
ALT: 2198 U/L — ABNORMAL HIGH (ref 0–44)
AST: 9301 U/L — ABNORMAL HIGH (ref 15–41)
Anion gap: 21 — ABNORMAL HIGH (ref 5–15)
BILIRUBIN TOTAL: 8 mg/dL — AB (ref 0.3–1.2)
BUN: 63 mg/dL — ABNORMAL HIGH (ref 8–23)
CALCIUM: 7.3 mg/dL — AB (ref 8.9–10.3)
CO2: 25 mmol/L (ref 22–32)
Chloride: 95 mmol/L — ABNORMAL LOW (ref 98–111)
Creatinine, Ser: 2.3 mg/dL — ABNORMAL HIGH (ref 0.44–1.00)
GFR, EST AFRICAN AMERICAN: 25 mL/min — AB (ref >60)
GFR, EST NON AFRICAN AMERICAN: 21 mL/min — AB (ref >60)
Glucose, Bld: 95 mg/dL (ref 70–99)
Sodium: 141 mmol/L (ref 135–145)
Total Protein: 4.4 g/dL — ABNORMAL LOW (ref 6.5–8.1)

## 2017-08-06 LAB — PHOSPHORUS: Phosphorus: 10 mg/dL — ABNORMAL HIGH (ref 2.5–4.6)

## 2017-08-06 LAB — CBC
HCT: 27.6 % — ABNORMAL LOW (ref 36.0–46.0)
Hemoglobin: 8.7 g/dL — ABNORMAL LOW (ref 12.0–15.0)
MCH: 29.7 pg (ref 26.0–34.0)
MCHC: 31.5 g/dL (ref 30.0–36.0)
MCV: 94.2 fL (ref 78.0–100.0)
PLATELETS: 158 10*3/uL (ref 150–400)
RBC: 2.93 MIL/uL — ABNORMAL LOW (ref 3.87–5.11)
RDW: 17.9 % — AB (ref 11.5–15.5)
WBC: 17.5 10*3/uL — ABNORMAL HIGH (ref 4.0–10.5)

## 2017-08-06 LAB — MAGNESIUM: MAGNESIUM: 2.7 mg/dL — AB (ref 1.7–2.4)

## 2017-08-06 LAB — PROTIME-INR
INR: 3.07
PROTHROMBIN TIME: 31.5 s — AB (ref 11.4–15.2)

## 2017-08-06 LAB — GLUCOSE, CAPILLARY
GLUCOSE-CAPILLARY: 95 mg/dL (ref 70–99)
GLUCOSE-CAPILLARY: 117 mg/dL — AB (ref 70–99)

## 2017-08-06 MED ORDER — LIP MEDEX EX OINT
TOPICAL_OINTMENT | CUTANEOUS | Status: AC
  Administered 2017-08-06: 15:00:00
  Filled 2017-08-06: qty 7

## 2017-08-08 ENCOUNTER — Ambulatory Visit: Payer: BLUE CROSS/BLUE SHIELD

## 2017-08-08 ENCOUNTER — Telehealth: Payer: Self-pay

## 2017-08-08 NOTE — Telephone Encounter (Signed)
On 08/08/2017 I received a d/c from [facility].  The d/c is for burial.  The patient is a patient of Doctor Ramasway.  The d/c will be taken to Pulmonary Unit for signature.   On 08/10/17 I received the d/c back from Doctor Ramaswamy.  I got the d/c ready and called the funeral home to let them know the d/c is ready for pickup.

## 2017-08-09 ENCOUNTER — Ambulatory Visit: Admit: 2017-08-09 | Payer: BLUE CROSS/BLUE SHIELD

## 2017-08-09 ENCOUNTER — Ambulatory Visit: Payer: BLUE CROSS/BLUE SHIELD

## 2017-08-10 ENCOUNTER — Ambulatory Visit: Payer: BLUE CROSS/BLUE SHIELD

## 2017-08-10 ENCOUNTER — Encounter: Payer: Self-pay | Admitting: Radiation Oncology

## 2017-08-10 NOTE — Progress Notes (Signed)
  Radiation Oncology         [phone] ________________________________  Name: Wanda James MRN: [id number]  Date: 08/10/2017  DOB: 1955-01-25  End of Treatment Note  Diagnosis:   Extensive stage small cell lung cancer with metastatic disease to the liver   Indication for treatment::  palliative       Radiation treatment dates:   07/28/2017 - 08/04/2017  Site/planned dose:   Right Lung / 30 Gy in 10 fractions  Beams/energy:   3D / 10X, 6X Photon   Narrative: The patient's status declined during her course of radiation, and her radiotherapy was discontinued after 5 treatments per patient's request.  Plan: The patient died after discontinuing radiation. ________________________________  Jodelle Gross, MD, PhD  This document serves as a record of services personally performed by Kyung Rudd, MD. It was created on his behalf by Rae Lips, a trained medical scribe. The creation of this record is based on the scribe's personal observations and the provider's statements to them. This document has been checked and approved by the attending provider.

## 2017-08-11 ENCOUNTER — Ambulatory Visit: Payer: BLUE CROSS/BLUE SHIELD

## 2017-08-11 ENCOUNTER — Inpatient Hospital Stay: Payer: BLUE CROSS/BLUE SHIELD | Admitting: Pulmonary Disease

## 2017-08-12 ENCOUNTER — Ambulatory Visit: Payer: BLUE CROSS/BLUE SHIELD

## 2017-08-15 ENCOUNTER — Ambulatory Visit: Payer: BLUE CROSS/BLUE SHIELD

## 2017-08-16 ENCOUNTER — Ambulatory Visit: Payer: BLUE CROSS/BLUE SHIELD

## 2017-08-17 ENCOUNTER — Ambulatory Visit: Payer: BLUE CROSS/BLUE SHIELD

## 2017-08-18 ENCOUNTER — Ambulatory Visit: Payer: BLUE CROSS/BLUE SHIELD

## 2017-08-19 ENCOUNTER — Ambulatory Visit: Payer: BLUE CROSS/BLUE SHIELD

## 2017-08-22 ENCOUNTER — Ambulatory Visit: Payer: BLUE CROSS/BLUE SHIELD

## 2017-08-23 ENCOUNTER — Ambulatory Visit: Payer: BLUE CROSS/BLUE SHIELD

## 2017-08-24 ENCOUNTER — Ambulatory Visit: Payer: BLUE CROSS/BLUE SHIELD

## 2017-08-25 ENCOUNTER — Ambulatory Visit: Payer: BLUE CROSS/BLUE SHIELD

## 2017-08-26 ENCOUNTER — Ambulatory Visit: Admit: 2017-08-26 | Payer: BLUE CROSS/BLUE SHIELD

## 2017-08-29 ENCOUNTER — Ambulatory Visit: Payer: BLUE CROSS/BLUE SHIELD

## 2017-08-30 ENCOUNTER — Ambulatory Visit: Payer: BLUE CROSS/BLUE SHIELD

## 2017-08-31 ENCOUNTER — Ambulatory Visit: Payer: BLUE CROSS/BLUE SHIELD

## 2017-09-01 ENCOUNTER — Ambulatory Visit: Payer: BLUE CROSS/BLUE SHIELD

## 2017-09-02 ENCOUNTER — Ambulatory Visit: Payer: BLUE CROSS/BLUE SHIELD

## 2017-09-04 NOTE — Procedures (Signed)
Extubation Procedure Note  Patient Details:   Name: Wanda James DOB: Feb 08, 1955 MRN: [id number]   Airway Documentation:    Vent end date: 08/08/2017 Vent end time: 1729   Evaluation  O2 sats: transiently fell during during procedure Complications: No apparent complications Patient did tolerate procedure well. Bilateral Breath Sounds: Clear, Diminished   No, Pt past away on vent. Per Dr.Ramaswamy ok to pull tube.   Johnette Abraham August 08, 2017, 5:37 PM

## 2017-09-04 NOTE — Discharge Summary (Signed)
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 patient with    has a past medical history of Acid reflux, Arthritis, Cancer ([place]), Cataract, Chiari malformation type I ([place]), Complication of anesthesia, COPD (chronic obstructive pulmonary disease) ([place]), Diverticulitis, Headache, Pneumonia, Stroke ([place]), and Subclavian artery stenosis, left ([place]).   has a past surgical history that includes carotidendarterectomy; Nephrectomy; Abdominal hysterectomy; Inner ear surgery; Eye surgery; Tonsillectomy; Colon surgery; Partial colectomy; Tubal ligation; Breast surgery; Rhinoplasty; and Lumbar laminectomy/decompression microdiscectomy (N/A, 08/19/2016).   Admitted on 07/20/2017 with       is a 62 year old female patient with a significant history of renal cell carcinoma status post prior left nephrectomy, tobacco abuse, prob COPD with oxygen dependence since recent admit for PNA at end of June. Presented to the emergency room with chief complaint of: Approximately 1 week history of worsening cough initially productive of sputum with black spots, shortness of breath, decreased p.o. appetite with shortness of breath particularly worse over the  prior 2 days.    Recently discharged on the hospital in 6/23 2019 where she was admitted for acute acute exacerbation COPD and also pneumonia she was discharged home with prednisone taper, oxygen, and Levaquin. Review of imaging during that hospitalization did show right lower lobe airspace disease, this appeared to improve some on serial films prior to discharge.  She was seen once again by her primary care provider on 7/17 for worsening of symptoms at that time she was started on Biaxin, her symptoms have not improved and therefore she presented to the emergency room.   On presentation this time a CT of chest was obtained this demonstrated: Partial right lower lobe collapse/atelectasis with displacement of the right fissure, mediastinal adenopathy, subtle hypodense masslike abnormality of the liver.  She was admitted with a working diagnosis of recurrent pneumonia with possible lung mass.  Pulmonary has been asked to evaluate in regards to CT findings.  .   EVENTS and course post admit CT chest 7/21: Partial right lower lobe collapse/atelectasis with displacement of the right major fissure and caudal displacement of the right hilum.  There is also mediastinal lymphadenopathy and a subtle hypodensity masslike area in the liver   7/21 - pccm consult  7/22 - liver biopsy recommended and ccm signed off  7/23 - s/p liver biopsy  (MRI Bran negative for mets) - small lung - suggestive of primary  lung cancer  7/28 - intermediate prob for VQ  7/29 - pccm recalled. Per family - 2L [place] x 1 month. Then 5LNC few days and overnigth 15LNC. VQ scan intermediate prob technically. Patient has known RLL collapse due to small  cell. No calf swelling. Cannot get IV dye due to allergy. Maintains BP.  Has needed morphine for cancer related pain. Per RN -  Has refused XRT x 2 days due to nause and being "tired and dont know" per   7/30 - code status reversed to full code. On IV heparin empiric  (duplex neg for dvt) based on intermediate prob vq and worsening hypoxemia. O2 needs now improved to 7L [place]. Husband at bedside - feels she is some better. Reports moaning due to abd pain every 3h and getting morphine. Denies any confusion. Goal is palliative  XRT for mediastinal adenopathy and RLL collapse while in ICU with full code and if/when ECOG improves.   7/31 - intubated last night due to worsening hypoxemia and distress despite IV heparin (DVT negative and ECHO no RV strian). That am- sedated on fent gtt. Phos being repleted. Maintains bp/hr/ur op. XRT pending later today.  Family at bedside.  T max 103 with rising wbc and lactic > 3  8/2 - t with progressive decline , increased o2 demands , hypotension .  Family discussion , pt with sign decline , XRT discontinued, PT DNR . , no transfusions if bleeding , no ABX , no HD , supportive care  Family at bedside , support provided.  Letter request for pt son to come from correctional facility to visit - letter completed    8/3 - worsening lactic acidosi 8 and high K. In circulatory shock and MODS. Remains on vent. Family hoping for incarcerated son to arrive. Now in ATN as well. PEr daughter - "she is waiting for her son to arrive". Son could not be released from jail in time and patient died on this day due to complications of her lung cancer with family at bedside      SIGNED Dr. Brand Males, M.D., [facility]C.P Pulmonary and Critical Care Medicine Staff Physician [place] [facility] Pager: [phone], If no answer or between  15:00h - 7:00h: call [phone]  08/19/2017 10:29 AM

## 2017-09-04 NOTE — Progress Notes (Signed)
Name: DOTTI BUSEY MRN: [id number] DOB: 02-11-1955    ADMISSION DATE:  07/22/2017 CONSULTATION DATE:  7/21  REFERRING MD :  N[place]ada Crane  CHIEF COMPLAINT:  Abnormal CT chest   BRIEF PATIENT DESCRIPTION/HPI    brief This is a 62 year old female patient with a significant history of renal cell carcinoma status post prior left nephrectomy, tobacco abuse, prob COPD with oxygen dependence since recent admit for PNA at end of June. Presented to the emergency room with chief complaint of: Approximately 1 week history of worsening cough initially productive of sputum with black spots, shortness of breath, decreased p.o. appetite with shortness of breath particularly worse over the prior 2 days.    Recently discharged on the hospital in 6/23 2019 where she was admitted for acute acute exacerbation COPD and also pneumonia she was discharged home with prednisone taper, oxygen, and Levaquin. Review of imaging during that hospitalization did show right lower lobe airspace disease, this appeared to improve some on serial films prior to discharge.  She was seen once again by her primary care provider on 7/17 for worsening of symptoms at that time she was started on Biaxin, her symptoms have not improved and therefore she presented to the emergency room.   On presentation this time a CT of chest was obtained this demonstrated: Partial right lower lobe collapse/atelectasis with displacement of the right fissure, mediastinal adenopathy, subtle hypodense masslike abnormality of the liver.  She was admitted with a working diagnosis of recurrent pneumonia with possible lung mass.  Pulmonary has been asked to evaluate in regards to CT findings.    has a past medical history of Acid reflux, Arthritis, Cancer ([place]), Cataract, Chiari malformation type I ([place]), Complication of anesthesia, COPD (chronic obstructive pulmonary disease) ([place]), Diverticulitis, Headache, Pneumonia, Stroke ([place]), and Subclavian artery stenosis,  left ([place]).   has a past surgical history that includes carotidendarterectomy; Nephrectomy; Abdominal hysterectomy; Inner ear surgery; Eye surgery; Tonsillectomy; Colon surgery; Partial colectomy; Tubal ligation; Breast surgery; Rhinoplasty; and Lumbar laminectomy/decompression microdiscectomy (N/A, 08/19/2016).   EVENTS CT chest 7/21: Partial right lower lobe collapse/atelectasis with displacement of the right major fissure and caudal displacement of the right hilum.  There is also mediastinal lymphadenopathy and a subtle hypodensity masslike area in the liver   7/21 - pccm consult  7/22 - liver biopsy recommended and ccm signed off  7/23 - s/p liver biopsy  (MRI Bran negative for mets) - small lung - suggestive of primary  lung cancer  7/28 - intermediate prob for VQ  7/29 - pccm recalled. Per family - 2L [place] x 1 month. Then 5LNC few days and overnigth 15LNC. VQ scan intermediate prob technically. 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 No calf swelling. Cannot get IV dye due to allergy. Maintains BP.  Has needed morphine for cancer related pain. Per RN -  Has refused XRT x 2 days due to nause and being "tired and dont know" per   7/30 - code status reversed to full code. On IV heparin empiric (duplex neg for dvt) based on intermediate prob vq and worsening hypoxemia. O2 needs now improved to 7L [place]. Husband at bedside - feels she is some better. Reports moaning due to abd pain every 3h and getting morphine. Denies any confusion. Goal is palliative  XRT for mediastinal adenopathy and RLL collapse while in ICU with full code and if/when ECOG improves.   7/31 - intubated last night due to worsening hypoxemia and distress despite IV heparin (  DVT negative and ECHO no RV strian). That am- sedated on fent gtt. Phos being repleted. Maintains bp/hr/ur op. XRT pending later today.  Family at bedside.  T max 103 with rising wbc and lactic > 3  8/2 - t with progressive decline , increased o2  demands , hypotension .  Family discussion , pt with sign decline , XRT discontinued, PT DNR . , no transfusions if bleeding , no ABX , no HD , supportive care  Family at bedside , support provided.  Letter request for pt son to come from correctional facility to visit - letter completed     SUBJECTIVE/OVERNIGHT/INTERVAL HX 8/3 - worsening lactic acidosi 8 and high K. In circulatory shock and MODS. Remains on vent. Family hoping for incarcerated son to arrive. Now in ATN as well. PEr daughter - "she is waiting for her son to arrive"   VITAL SIGNS: Temp:  [98.2 F (36.8 C)-99.9 F (37.7 C)] 98.2 F (36.8 C) (08/03 0800) Pulse Rate:  [97-105] 100 (08/03 0800) Resp:  [13-21] 15 (08/03 0800) BP: (51-79)/(13-48) 51/19 (08/03 0800) SpO2:  [86 %-100 %] 93 % (08/03 0800) FiO2 (%):  [50 %-100 %] 50 % (08/03 0800) Weight:  [64.6 kg (142 lb 6.7 oz)] 64.6 kg (142 lb 6.7 oz) (08/03 0535)  Intake/Output Summary (Last 24 hours) at August 07, 2017 0842 Last data filed at August 07, 2017 0600 Gross per 24 hour  Intake 1354.33 ml  Output -  Net 1354.33 ml    PHYSICAL EXAMINATION:  General Appearance:    Looks criticall ill. Frail  Head:    Normocephalic, without obvious abnormality, atraumatic  Eyes:    PERRL - yes, conjunctiva/corneas - clear      Ears:    Normal external ear canals, both ears  Nose:   NG tube - no  Throat:  ETT TUBE - yes , OG tube - yes  Neck:   Supple,  No enlargement/tenderness/nodules     Lungs:     Clear to auscultation bilaterally, Ventilator   Synchrony - yes  Chest wall:    No deformity  Heart:    S1 and S2 normal, no murmur, CVP - no.  Pressors - no. MAP 38  Abdomen:     Soft, no masses, no organomegaly  Genitalia:    Not done  Rectal:   not done  Extremities:   Extremities- intact     Skin:   Intact in exposed areas .      Neurologic:   Sedation - fent gtt -> RASS - 4      PULMONARY Recent Labs  Lab 07/31/17 1916 08/02/17 1819 08/02/17 2015 08/04/17 1715    PHART 7.396 7.452* 7.388 7.400  PCO2ART 43.1 38.7 48.4* 41.4  PO2ART 70.1* 45.4* 113* 71.4*  HCO3 25.6 26.6 28.5* 24.8  O2SAT 92.2 82.2 95.8 91.7    CBC Recent Labs  Lab 08/04/17 1400 08/05/17 0200 08-07-17 0459  HGB 9.2* 9.1* 8.7*  HCT 29.3* 28.5* 27.6*  WBC 19.3* 19.0* 17.5*  PLT 181 178 158    COAGULATION Recent Labs  Lab 08/04/17 1400 08/05/17 0200 August 07, 2017 0459  INR 1.50 1.76 3.07    CARDIAC   Recent Labs  Lab 07/31/17 1906 08/01/17 0145 08/01/17 0652 08/02/17 1849 08/03/17 0617  TROPONINI 0.04* 0.06* 0.06* 0.04* 0.04*   No results for input(s): PROBNP in the last 168 hours.   CHEMISTRY Recent Labs  Lab 08/03/17 0617 08/04/17 0500 08/04/17 1045 08/04/17 1400 08/05/17 0200 2017-08-07 0459  NA 147*  141 145 140 144 141  K 4.6 6.1* 6.4* 6.1* 6.8* >7.5*  CL 107 101 103 102 102 95*  CO2 31 28 28 25 26 25   GLUCOSE 137* 127* 128* 148* 198* 95  BUN 29* 38* 42* 42* 51* 63*  CREATININE 0.63 0.58 0.51 0.62 0.96 2.30*  CALCIUM 8.2* 7.9* 8.3* 8.3* 8.0* 7.3*  MG 2.1 2.1  --   --  2.6* 2.7*  PHOS <1.0* 2.2*  --   --  3.8 10.0*   Estimated Creatinine Clearance: 22.1 mL/min (A) (by C-G formula based on SCr of 2.3 mg/dL (H)).   LIVER Recent Labs  Lab 08/02/17 1849 08/03/17 0617 08/04/17 0500 08/04/17 1400 08/05/17 0200 08-07-17 0459  AST 204* 396* 1,869*  --  4,083* 9,301*  ALT 110* 159* 504*  --  1,025* 2,198*  ALKPHOS 490* 538* 580*  --  827* 1,204*  BILITOT 3.4* 3.6* 5.3*  --  6.6* 8.0*  PROT 5.2* 5.4* 5.4*  --  5.2* 4.4*  ALBUMIN 2.0* 2.0* 2.6*  --  2.6* 2.2*  INR  --   --   --  1.50 1.76 3.07     INFECTIOUS Recent Labs  Lab 08/03/17 1100  08/04/17 0500 08/04/17 1400 08/05/17 0200 08/05/17 0626  LATICACIDVEN  --    < > 4.8* 6.2*  --  8.3*  PROCALCITON 6.32  --  7.53  --  8.81  --    < > = values in this interval not displayed.     ENDOCRINE CBG (last 3)  Recent Labs    08/05/17 2319 2017/08/07 0507 2017/08/07 0734  GLUCAP 116*  95 117*         IMAGING x48h  - image(s) personally visualized  -   highlighted in bold Ct Abdomen Pelvis Wo Contrast  Result Date: 08/04/2017 CLINICAL DATA:  History of lung carcinoma with hepatic metastatic disease EXAM: CT ABDOMEN AND PELVIS WITHOUT CONTRAST TECHNIQUE: Multidetector CT imaging of the abdomen and pelvis was performed following the standard protocol without IV contrast. COMPARISON:  07/10/2017 FINDINGS: Lower chest: Bilateral pleural effusions are noted increased from the prior exam. Persistent right lower lobe consolidation is noted. Diffuse increased interstitial changes are noted likely related to underlying edema. The possibility of lymphangitic spread would deserve consideration although has appeared over 11 days likely more related to edema. Hepatobiliary: Multiple hypodense lesions are noted throughout the liver similar to that seen on prior exam consistent with metastatic disease. This is biopsy-proven. Pancreas: Unremarkable. No pancreatic ductal dilatation or surrounding inflammatory changes. Spleen: Normal in size without focal abnormality. Adrenals/Urinary Tract: Left kidney is been surgically removed. Right kidney demonstrates a tiny nonobstructing stone. The adrenal gland on the right is within normal limits. The left adrenal gland is not well appreciated. Bladder is decompressed by Foley catheter. Stomach/Bowel: Postsurgical changes in the colon are noted. No obstructive changes are identified. Mild inflammatory change is noted surrounding although bowel loops although likely related to the underlying mild ascites. Vascular/Lymphatic: Aortic atherosclerosis. No enlarged abdominal or pelvic lymph nodes. Reproductive: Status post hysterectomy. No adnexal masses. Other: Mild ascites is noted as well as changes of mild anasarca. Musculoskeletal: No acute bony abnormality is noted. IMPRESSION: Right lower lobe collapse consistent with the known central obstructing lesion.  Bilateral pleural effusions. Increase in interstitial changes bilaterally likely related to edema given its acute nature. Changes consistent with diffuse hepatic metastatic disease. Mild ascites and anasarca. Electronically Signed   By: Inez Catalina M.D.   On: 08/04/2017  13:29   Dg Chest Port 1 View  Result Date: 08/04/2017 CLINICAL DATA:  Follow-up pneumonia. EXAM: PORTABLE CHEST 1 VIEW COMPARISON:  08/04/2017. FINDINGS: Endotracheal tube, NG tube, right PICC line in stable position. Heart size stable. Diffuse bilateral pulmonary interstitial prominence and right-sided pleural effusion again noted. IMPRESSION: 1.  Lines and tubes in stable position. 2. Diffuse bilateral interstitial prominence and prominent right-sided pleural effusion. No interim change from prior exam. Electronically Signed   By: [place]   On: 08/04/2017 11:33     ASSESSMENT / PLAN  Principal Problem:   Acute hypoxemic respiratory failure ([place]) Active Problems:   Acute and chronic respiratory failure (acute-on-chronic) (HCC)   Small cell lung cancer (HCC)   Shock circulatory (HCC)   Lactic acidosis   Acute renal failure (ARF) ([place])   DNR (do not resuscitate)   Terminal care   Shock liver   HCAP (healthcare-associated pneumonia)   Liver metastases (HCC)   Malnutrition of moderate degree   Chest pain   COPD exacerbation (HCC)   Cancer associated pain   Other constipation   Lung cancer (HCC)   Nausea & vomiting   Abdominal pain   Hypoxia   Elevated troponin   Generalized anxiety disorder   Worsening parameters with hyperkalemia, renal failure, acidosis, and shock. She is actively dying. Family feels she is alive because she is holding on vent and life waiting for her incarcerated son Mr Ophelia Shoulder to be at bedside.  D/w  Chaplain - she will try to reach correctional facility in [place], [place] to get son here  Meanwhile ensure comfort on vent with fent gtt. Explaine dto family TF and vent not helpful but  pririty right now is for son to arrive     The patient is critically ill with multiple organ systems failure and requires high complexity decision making for assessment and support, frequent evaluation and titration of therapies, application of advanced monitoring technologies and extensive interpretation of multiple databases.   Critical Care Time devoted to patient care services described in this note is  30  Minutes. This time reflects time of care of this signee Dr Brand Males. This critical care time does not reflect procedure time, or teaching time or supervisory time of PA/NP/Med student/Med Resident etc but could involve care discussion time    Dr. Brand Males, M.D., [facility].C.P Pulmonary and Critical Care Medicine Staff Physician [place] [facility] Pager: [phone], If no answer or between  15:00h - 7:00h: call [phone]  08/14/2017 9:15 AM

## 2017-09-04 NOTE — Progress Notes (Signed)
picc line, foley and flexiseal removed. Post mortem care done. Family returned to room to say good bye. Gold necklace on pt returned to daughter.

## 2017-09-04 NOTE — Progress Notes (Signed)
Report for Saturday.  Update:  Our Pt. in 1222 passed at 1725, family at bedside. Chaplain called to provide information regarding funeral homes. Chaplain offered spiritual care via prayer. Family said they would like time to being alone, but asked for prayer from a far. Information on our funeral procedures explained. Pt. placement called and the funeral home name was given. Family waits for funeral home to pick Pt. up from the room.  Chaplain Marlou Sa    17-Aug-2017 1900  Clinical Encounter Type  Visited With Patient and family together  Visit Type Follow-up;Patient actively dying;Psychological support;Spiritual support;Death  Referral From Nurse  Consult/Referral To Faith community  Spiritual Encounters  Spiritual Needs Emotional;Grief support  Stress Factors  Patient Stress Factors None identified  Family Stress Factors Loss;Loss of control

## 2017-09-04 NOTE — Progress Notes (Signed)
Palliative care progress note  Ms. Betton is actively dying and her family continues to struggle to deal with this.  I have been working to support from psychosocial standpoint, but have also been trying to give family room to grieve as they have had a lot of family/visitors.  Spiritual care has been trying to facilitate visitation from her son who is incarcerated.  I spoke with chaplain today and she has been notified that he will not be coming to see her.  From her conversation with correctional facility, he will be given opportunity for visitation by himself at funeral home after she passes.  Patient's RN and I sat with patient's son and shared this information.  He was understandably upset but appreciative of staff efforts.  He is hopeful that something can change and his brother will be able to visit.  He will inform the rest of the family.  I later talked with RN who reports that son who is in prison was able to call.  I believe that this is the best we will be able to facilitate in this unfortunate situation.  I checked in with husband at the bedside as well.  He denies any needs.  Micheline Rough, MD [place] Palliative Medicine Team [phone]  NO CHARGE NOTE

## 2017-09-04 NOTE — Progress Notes (Signed)
The patient's daughter began having chest pain and shortness of breath.  The patient's daughter was transported to the ED via wheelchair for c/o chest pain and respiratory rate very high in the 40's.  The patient's daughter says she has asthma and used her inhaler when she began having shortness of breath.  The daughter is very upset and anxious currently.  Monitor patient closely, left patient in the ED with an RN.  Sharri Loya Roselie Awkward RN

## 2017-09-04 NOTE — Progress Notes (Signed)
Pt passed at 1725, family at bedside. Pt extubated by RT. [facility] [place] [facility] notified along with Dr Chase Caller.

## 2017-09-04 DEATH — deceased

## 2017-09-06 ENCOUNTER — Ambulatory Visit: Payer: BLUE CROSS/BLUE SHIELD

## 2017-09-07 ENCOUNTER — Ambulatory Visit: Payer: BLUE CROSS/BLUE SHIELD

## 2017-09-08 ENCOUNTER — Ambulatory Visit: Payer: BLUE CROSS/BLUE SHIELD

## 2020-04-16 IMAGING — CR DG CHEST 2V
2 series · 2 of 2 positions shown · non-contrast
Comparison: 03/05/2015

CLINICAL DATA: Pt c/o SOB x2-3 weeks. Hx of COPD, PNA. Smokes 1
ppd.

EXAM:
CHEST - 2 VIEW

[w chest pa]
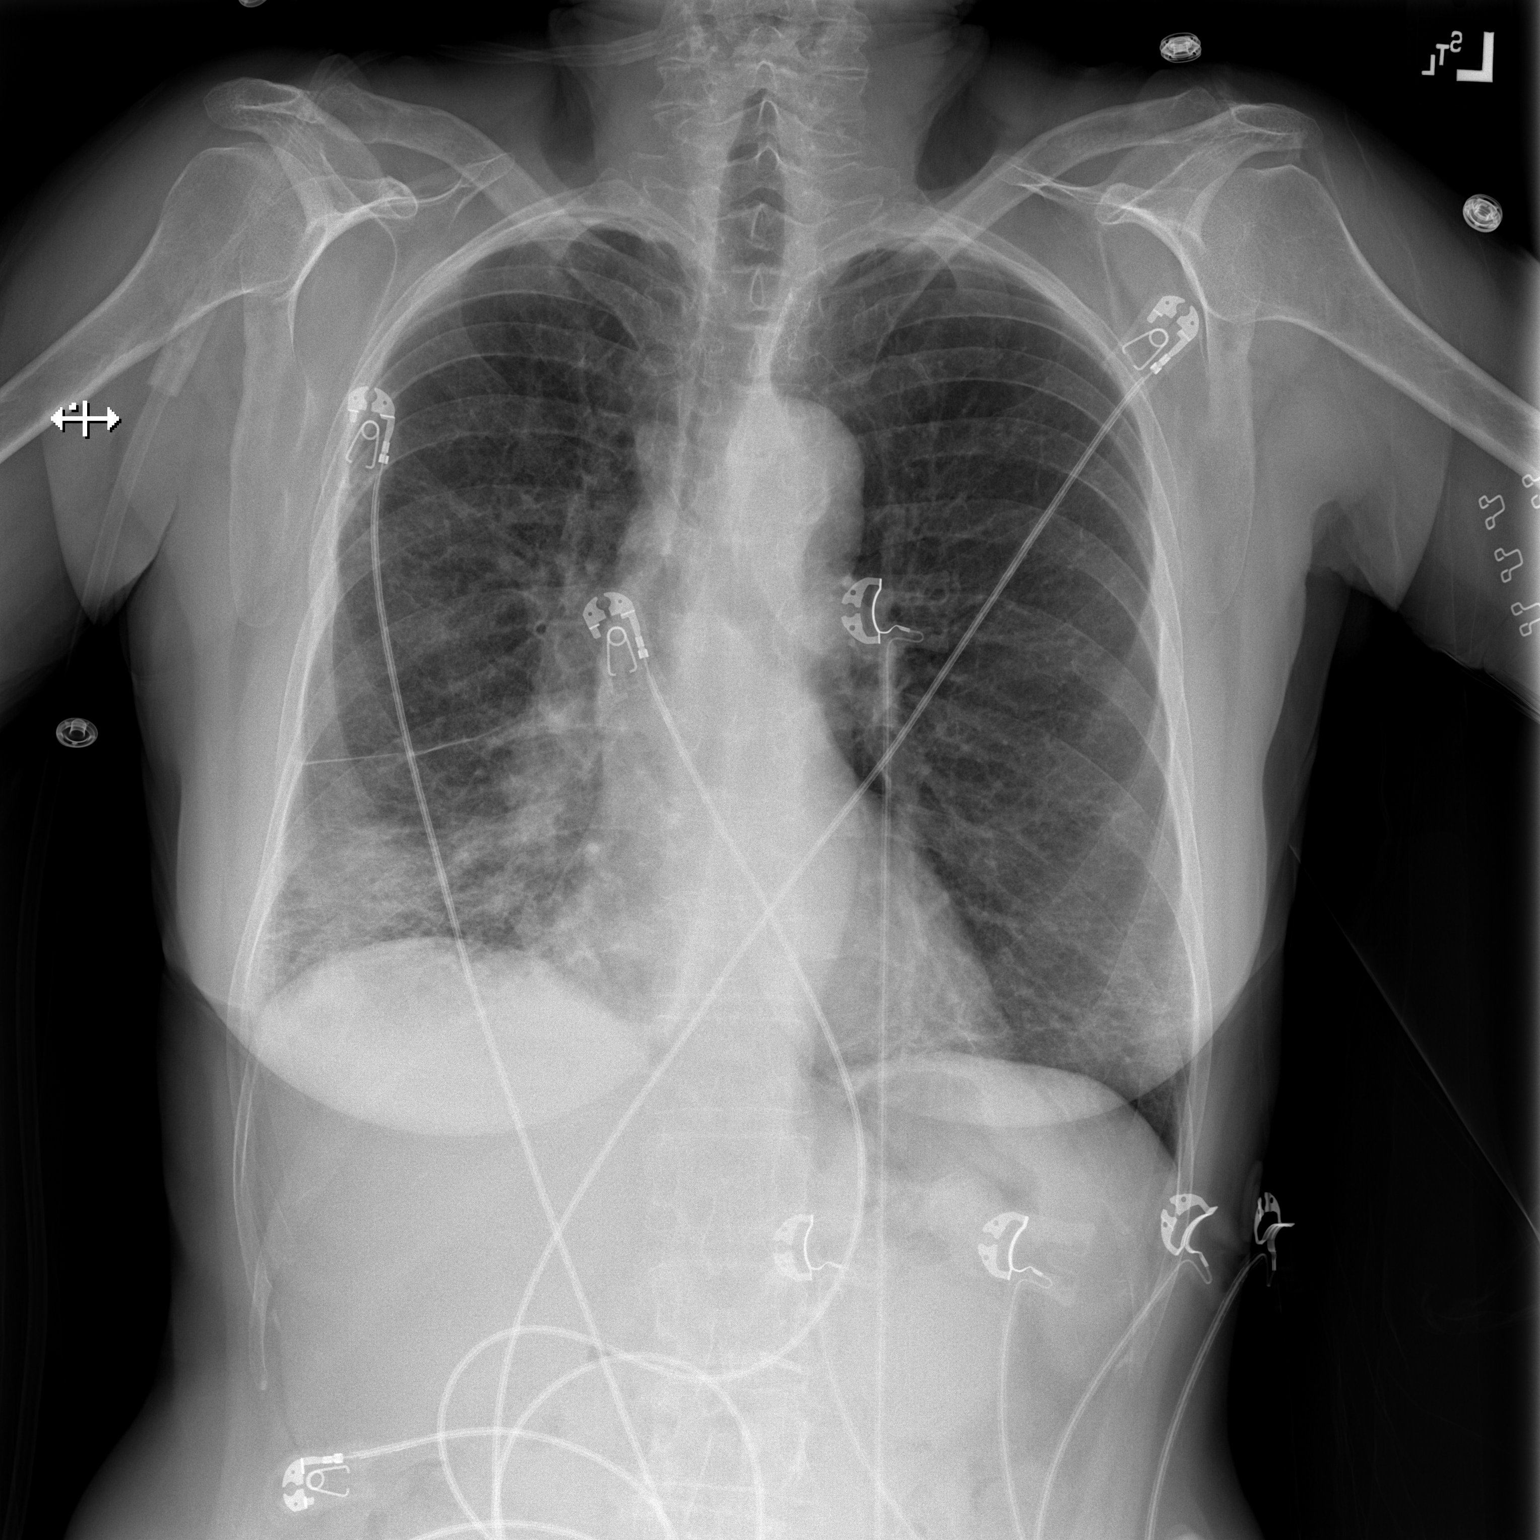

[w chest lat]
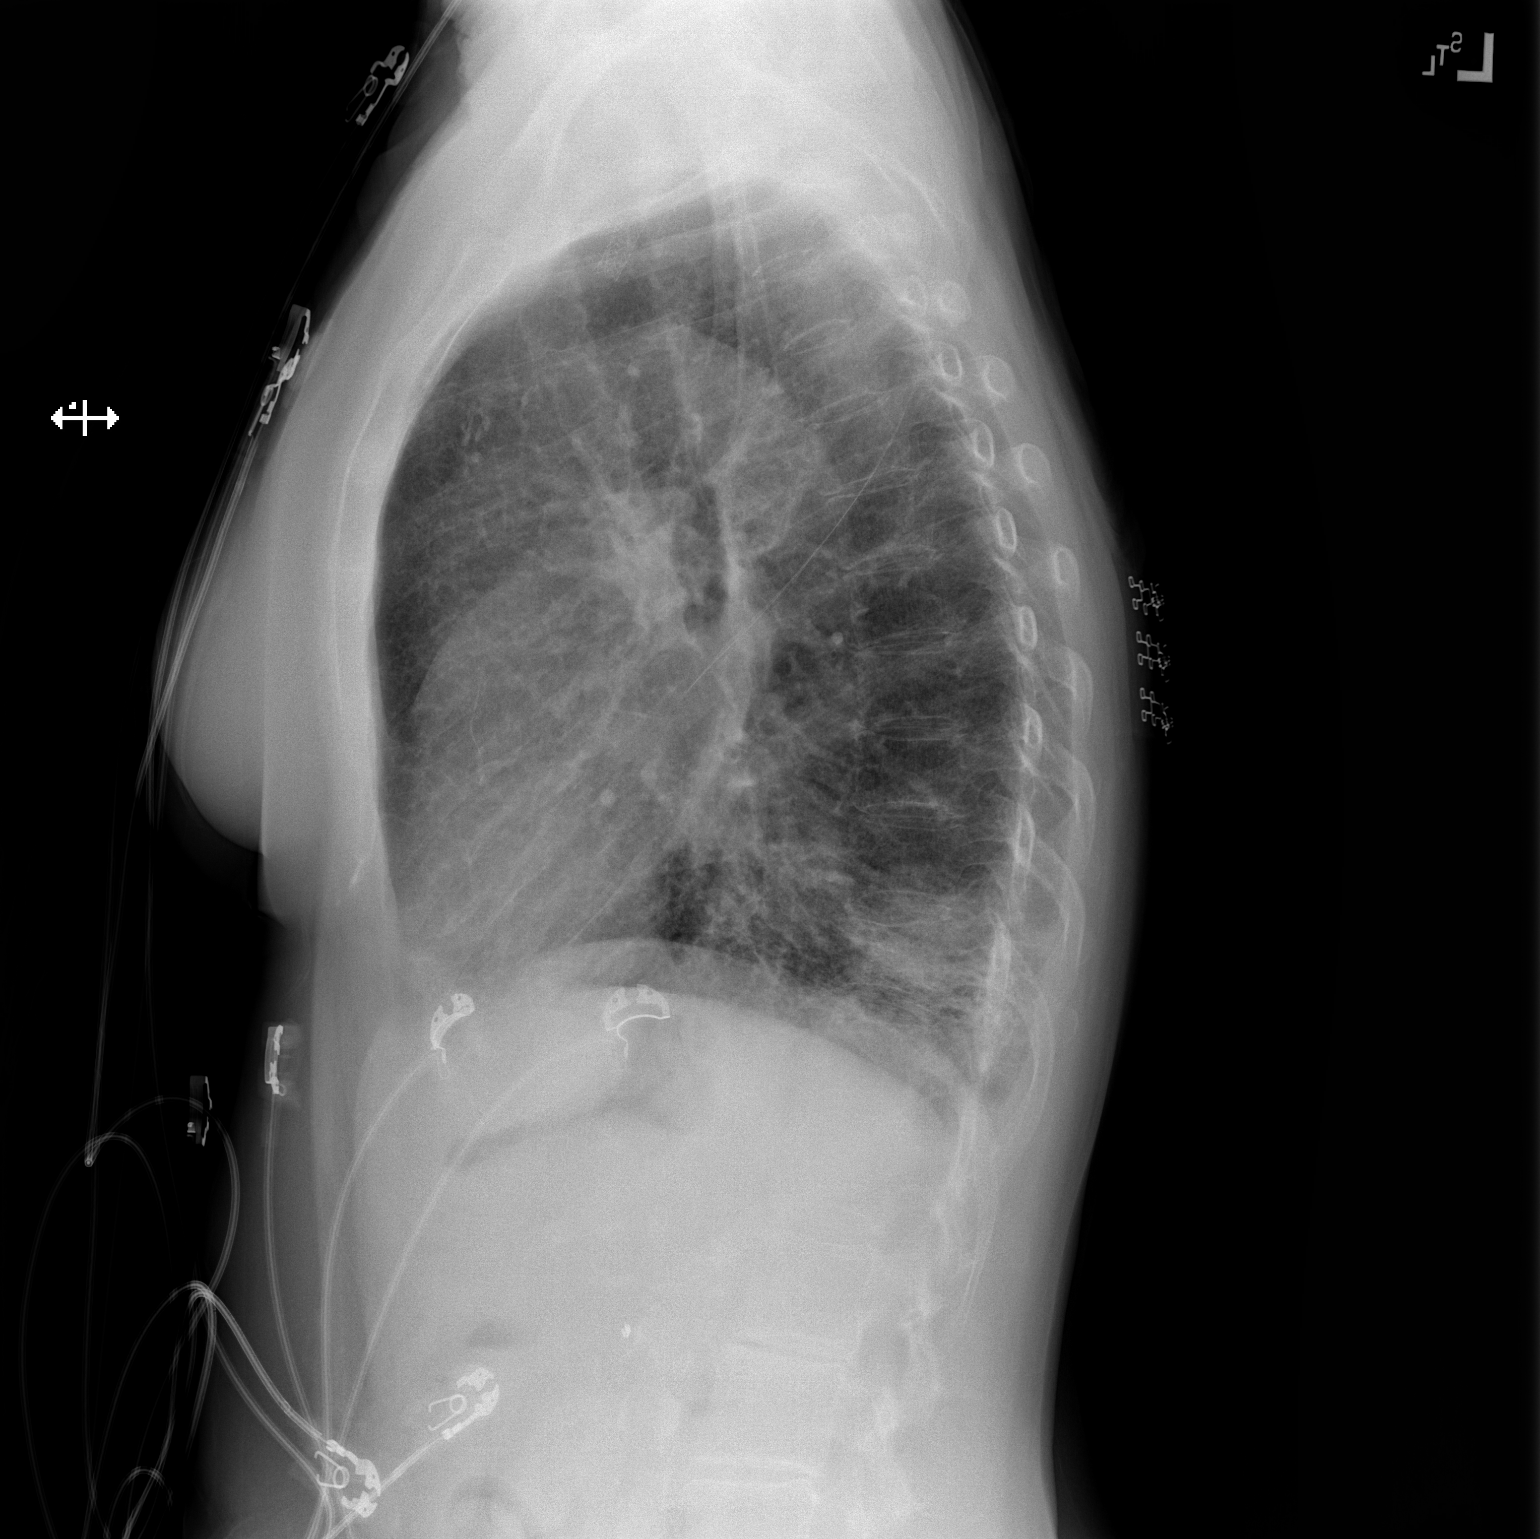

[2 of 2 positions shown; findings below may reference images not displayed]

FINDINGS: Cardiac silhouette is normal in size. No mediastinal or hilar
masses. No evidence of adenopathy.

Lungs are mildly hyperexpanded. There are reticular and linear lung
base opacities, greater on the right, similar to the prior study. It
intervening hazy airspace opacity is noted in the posterolateral
right lower lobe, less confluent than noted on the prior exam.
Remainder of the lungs is clear.

No pleural effusion or pneumothorax.

Skeletal structures are intact.
IMPRESSION: 1. No convincing acute cardiopulmonary disease.
2. Right greater than left lung base opacities most likely due to
interstitial fibrosis.
